# Patient Record
Sex: Male | Born: 1945 | Race: White | Hispanic: No | Marital: Married | Smoking: Former smoker
Health system: Southern US, Community
[De-identification: ages and names within clinical notes are randomized; demographics above are authoritative.]

## PROBLEM LIST (undated history)

## (undated) DIAGNOSIS — Z87442 Personal history of urinary calculi: Secondary | ICD-10-CM

## (undated) DIAGNOSIS — R1310 Dysphagia, unspecified: Secondary | ICD-10-CM

## (undated) DIAGNOSIS — I10 Essential (primary) hypertension: Secondary | ICD-10-CM

## (undated) DIAGNOSIS — C3490 Malignant neoplasm of unspecified part of unspecified bronchus or lung: Secondary | ICD-10-CM

## (undated) DIAGNOSIS — C61 Malignant neoplasm of prostate: Secondary | ICD-10-CM

## (undated) DIAGNOSIS — R0600 Dyspnea, unspecified: Secondary | ICD-10-CM

## (undated) DIAGNOSIS — E785 Hyperlipidemia, unspecified: Secondary | ICD-10-CM

## (undated) HISTORY — DX: Essential (primary) hypertension: I10

## (undated) HISTORY — DX: Malignant neoplasm of prostate: C61

---

## 2006-04-01 ENCOUNTER — Encounter: Admission: RE | Admit: 2006-04-01 | Discharge: 2006-04-01 | Payer: Self-pay | Admitting: Family Medicine

## 2006-04-01 ENCOUNTER — Ambulatory Visit: Payer: Self-pay | Admitting: Dentistry

## 2006-04-07 ENCOUNTER — Ambulatory Visit: Admission: RE | Admit: 2006-04-07 | Discharge: 2006-06-22 | Payer: Self-pay | Admitting: *Deleted

## 2006-04-08 ENCOUNTER — Ambulatory Visit (HOSPITAL_COMMUNITY): Admission: RE | Admit: 2006-04-08 | Discharge: 2006-04-08 | Payer: Self-pay | Admitting: Internal Medicine

## 2006-04-08 IMAGING — CT NM PET TUM IMG SKULL BASE T - THIGH
6 series · 25 of 25 positions shown · IV contrast (OM)
Comparison: None.   The outside report from a neck CT from [facility] is reviewed.

CLINICAL DATA: Tonsillar cancer.  Right-sided bleeding.  
FDG PET-CT TUMOR IMAGING (SKULL BASE TO THIGHS):
Fasting Blood Glucose:  99.
TECHNIQUE: 18.4 mCi F-18 FDG were administered via right AC injection.  Full ring PET imaging was performed from the skull base through the mid-thighs 60 minutes after injection.  CT data was obtained and used for attenuation correction and anatomic localization only.  (This was not acquired as a diagnostic CT examination.)  Subsequently, dedicated images were obtained with a small field of view through the neck.

[Series 1: pet ac · axial · 3.3mm · 4.69mm/px · z∈[-908,-38]mm · 5 of 267 slices shown]
[im 1/267]
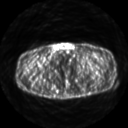
[im 67/267]
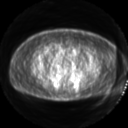
[im 134/267]
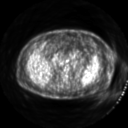
[im 200/267]
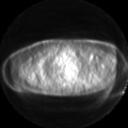
[im 267/267]
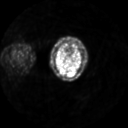

[Series 2: ct images · axial · 3.8mm · 0.98mm/px · z∈[-908,-38]mm · 5 of 267 slices shown]
[im 1/267]
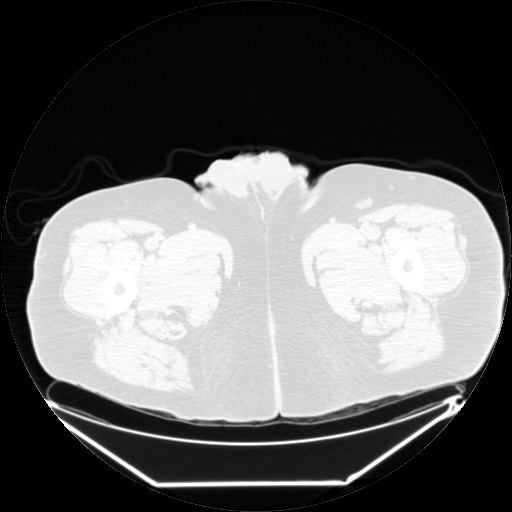
[im 67/267]
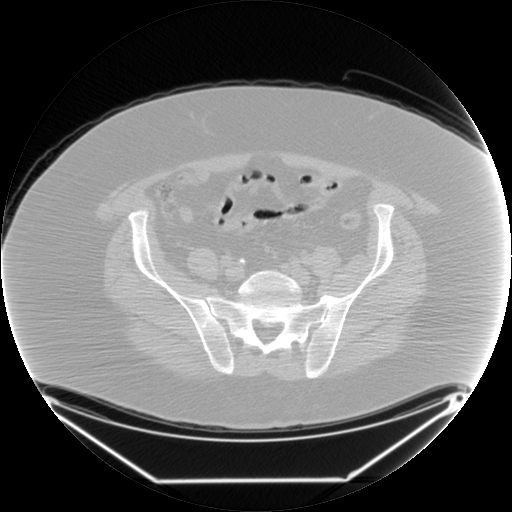
[im 134/267]
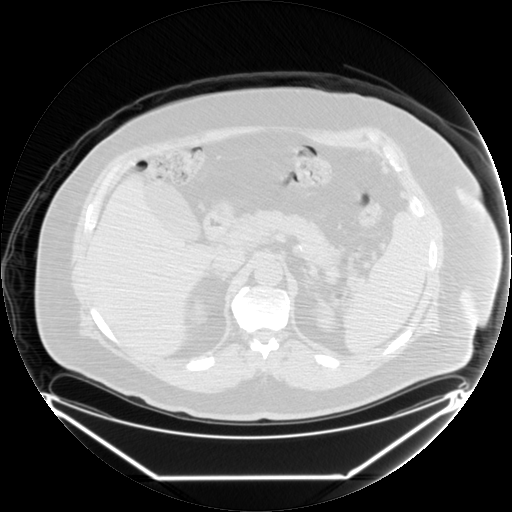
[im 200/267]
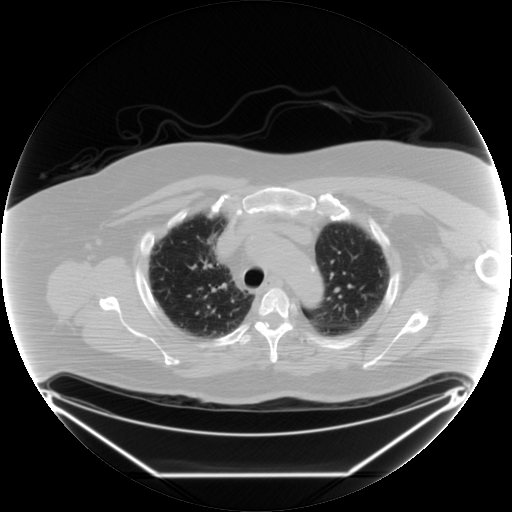
[im 267/267  brain]
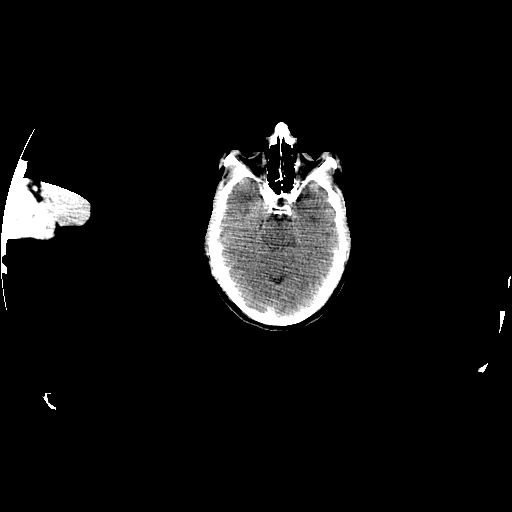

[Series 2: pet nac · axial · 3.3mm · 4.69mm/px · z∈[-908,-38]mm · 6 of 267 slices shown]
[im 1/267]
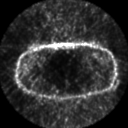
[im 54/267]
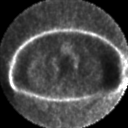
[im 107/267]
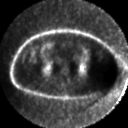
[im 160/267]
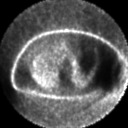
[im 213/267]
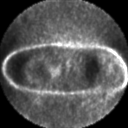
[im 267/267]
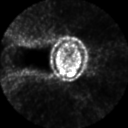

[Series 123: mip · coronal · 3.3mm · 4.69mm/px · 1 of 30 slices shown]
[im 1/30]
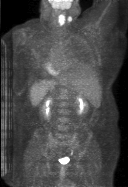

[Series 151: reformatted · axial · 3.3mm · 3.91mm/px · z∈[-895,-38]mm · 6 of 261 slices shown (1 of 2)]
[im 1/261]
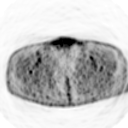
[im 53/261]
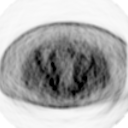
[im 105/261]
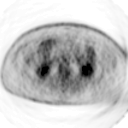
[im 157/261]
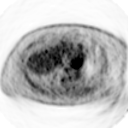
[im 209/261]
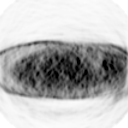
[im 261/261]
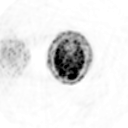

[Series 153: reformatted · coronal · 4.7mm · 6.98mm/px · 2 of 72 slices shown (2 of 2)]
[im 1/72]
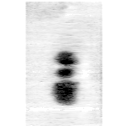
[im 72/72]
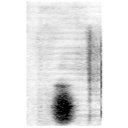

[25 of 25 positions shown; findings below may reference images not displayed]

FINDINGS: Dedicated neck images demonstrate malignant range activity centered within the right palatine tonsil region.  This begins just cephalad to the tongue base and continues to the caudal level of the right-sided vallecula.  The hypermetabolism corresponds to an approximately 3.5 x 3.4 cm mass which is most apparent on image 57.  This lesion measures maximum SUV of 26.0 grams per milliliter.  
There is a metastatic node in the jugular chain level II on the right measuring 2.1 x 1.8 cm and a maximum of 14.3 grams per milliliter.  
There are smaller lower right jugular and right submandibular lymph nodes.  The submandibular nodes are more mild but remain suspicious measuring maximally 4.0 grams per milliliter.  A more laterally positioned node deep to the sternocleidomastoid at the level of the hyoid on image 67 measures maximally 3.5 grams per milliliter.  There are also prominent jugular chain nodes just above the cricoid cartilage with mild activity (image 71).  
On the left side of the neck, there are smaller nodes with very mild activity.  An example is seen in the jugular chain on image 64 measuring maximally 2.7 grams per milliliter.  
Note is made of moderate activity measuring maximally 7.8 grams per milliliter involving right-sided mandibular teeth.  This correlates with periapical lucency on image 49.  
Whole body images demonstrate apparent left-sided nasopharyngeal activity which is felt to be physiologic and is not confirmed on the dedicated neck images.  
The exam is limited for evaluation of the chest, abdomen, and pelvis due to patient?s body habitus.  There are small axillary lymph nodes which measure between 2.5 and 2.6 grams per milliliter.  No other abnormal activity within the chest.  No abnormal activity within the abdomen or pelvis. 
CT images performed for attenuation correction demonstrate above described findings within the neck.  A 2 to 3 mm nodule on the right major fissure on image 78.  Mild fibrotic changes involve primarily the upper lobes subpleurally greater on the left than right.
IMPRESSION: 1.  Large hypermetabolic right-sided mass centered in the palatine tonsil region.  Definite right level II jugular node metastasis with other smaller less hypermetabolic nodes throughout the right neck which are suspicious.  Left-sided cervical and axillary lymph nodes are most likely reactive.  
2.  Dental activity in the right mandibular region.  Correlate with infection in this area. 
3.  Lung nodule is indeterminate and could be re-evaluated on followup diagnostic chest CT. 
4.  Mild left greater than right upper lobe peripheral fibrosis.  Correlate with prior infection or radiation therapy.  
5.  Somewhat limited exam due to patient body habitus.

## 2006-04-08 IMAGING — PT NM PET TUM IMG SKULL BASE T - THIGH
1 of 4 series · 2 of 25 positions shown · non-contrast
Comparison: None.   The outside report from a neck CT from [facility] is reviewed.

CLINICAL DATA: Tonsillar cancer.  Right-sided bleeding.  
FDG PET-CT TUMOR IMAGING (SKULL BASE TO THIGHS):
Fasting Blood Glucose:  99.
TECHNIQUE: 18.4 mCi F-18 FDG were administered via right AC injection.  Full ring PET imaging was performed from the skull base through the mid-thighs 60 minutes after injection.  CT data was obtained and used for attenuation correction and anatomic localization only.  (This was not acquired as a diagnostic CT examination.)  Subsequently, dedicated images were obtained with a small field of view through the neck.

[Series 2: ct images · axial · 3.8mm · 0.98mm/px · z∈[-119,-76]mm · 2 of 91 slices shown]
[im 65/91  brain]
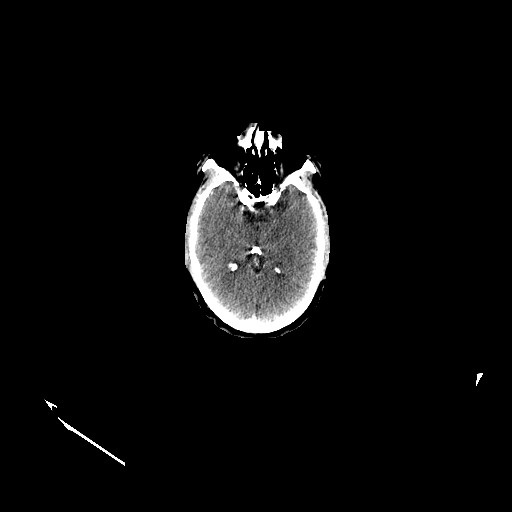
[im 78/91  brain]
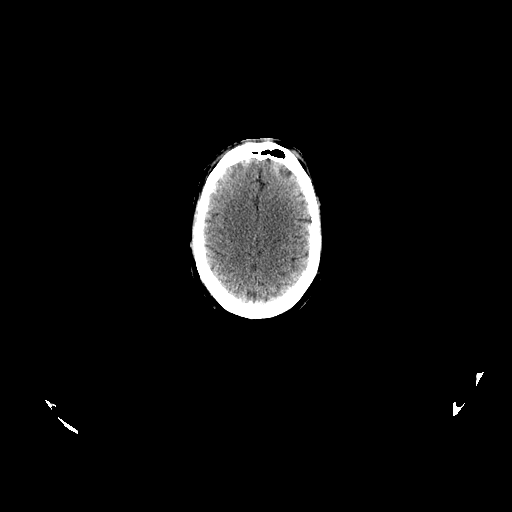

[2 of 25 positions shown; findings below may reference images not displayed]

FINDINGS: Dedicated neck images demonstrate malignant range activity centered within the right palatine tonsil region.  This begins just cephalad to the tongue base and continues to the caudal level of the right-sided vallecula.  The hypermetabolism corresponds to an approximately 3.5 x 3.4 cm mass which is most apparent on image 57.  This lesion measures maximum SUV of 26.0 grams per milliliter.  
There is a metastatic node in the jugular chain level II on the right measuring 2.1 x 1.8 cm and a maximum of 14.3 grams per milliliter.  
There are smaller lower right jugular and right submandibular lymph nodes.  The submandibular nodes are more mild but remain suspicious measuring maximally 4.0 grams per milliliter.  A more laterally positioned node deep to the sternocleidomastoid at the level of the hyoid on image 67 measures maximally 3.5 grams per milliliter.  There are also prominent jugular chain nodes just above the cricoid cartilage with mild activity (image 71).  
On the left side of the neck, there are smaller nodes with very mild activity.  An example is seen in the jugular chain on image 64 measuring maximally 2.7 grams per milliliter.  
Note is made of moderate activity measuring maximally 7.8 grams per milliliter involving right-sided mandibular teeth.  This correlates with periapical lucency on image 49.  
Whole body images demonstrate apparent left-sided nasopharyngeal activity which is felt to be physiologic and is not confirmed on the dedicated neck images.  
The exam is limited for evaluation of the chest, abdomen, and pelvis due to patient?s body habitus.  There are small axillary lymph nodes which measure between 2.5 and 2.6 grams per milliliter.  No other abnormal activity within the chest.  No abnormal activity within the abdomen or pelvis. 
CT images performed for attenuation correction demonstrate above described findings within the neck.  A 2 to 3 mm nodule on the right major fissure on image 78.  Mild fibrotic changes involve primarily the upper lobes subpleurally greater on the left than right.
IMPRESSION: 1.  Large hypermetabolic right-sided mass centered in the palatine tonsil region.  Definite right level II jugular node metastasis with other smaller less hypermetabolic nodes throughout the right neck which are suspicious.  Left-sided cervical and axillary lymph nodes are most likely reactive.  
2.  Dental activity in the right mandibular region.  Correlate with infection in this area. 
3.  Lung nodule is indeterminate and could be re-evaluated on followup diagnostic chest CT. 
4.  Mild left greater than right upper lobe peripheral fibrosis.  Correlate with prior infection or radiation therapy.  
5.  Somewhat limited exam due to patient body habitus.

## 2006-04-13 ENCOUNTER — Ambulatory Visit: Payer: Self-pay | Admitting: Dentistry

## 2006-05-05 DIAGNOSIS — C140 Malignant neoplasm of pharynx, unspecified: Secondary | ICD-10-CM

## 2006-05-05 HISTORY — DX: Malignant neoplasm of pharynx, unspecified: C14.0

## 2006-05-05 HISTORY — PX: OTHER SURGICAL HISTORY: SHX169

## 2006-07-16 ENCOUNTER — Ambulatory Visit: Payer: Self-pay | Admitting: Dentistry

## 2006-08-10 ENCOUNTER — Ambulatory Visit (HOSPITAL_COMMUNITY): Admission: RE | Admit: 2006-08-10 | Discharge: 2006-08-10 | Payer: Self-pay | Admitting: *Deleted

## 2006-08-10 IMAGING — PT NM PET TUM IMG SKULL BASE T - THIGH
4 series · 25 of 25 positions shown · non-contrast
Comparison: PET CT of [DATE].

CLINICAL DATA: Right-sided tonsillar cancer. Radiation therapy and chemotherapy last treatment in [DATE].
FDG PET-CT TUMOR IMAGING (SKULL BASE TO THIGHS) ? [DATE]: 
Fasting Blood Glucose:  107.
TECHNIQUE: 14.7 mCi F18-FDG were administered via a right AC injection.  Full ring PET imaging was performed from the skull base through the mid-thighs 65 minutes after injection.  Extra views were performed of the head and neck region.  CT data was obtained and used for attenuation correction and anatomic localization only.  (This was not acquired as a diagnostic CT examination.)

[Series 1: pet ac · axial · 3.3mm · 4.69mm/px · z∈[-870,+0]mm · 8 of 267 slices shown]
[im 1/267]
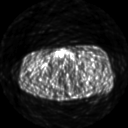
[im 39/267]
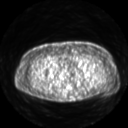
[im 77/267]
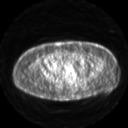
[im 115/267]
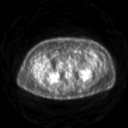
[im 153/267]
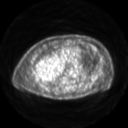
[im 191/267]
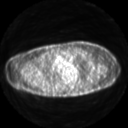
[im 229/267]
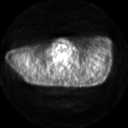
[im 267/267]
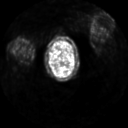

[Series 2: ct images · axial · 3.8mm · 0.98mm/px · z∈[-870,+0]mm · 8 of 266 slices shown]
[im 1/266]
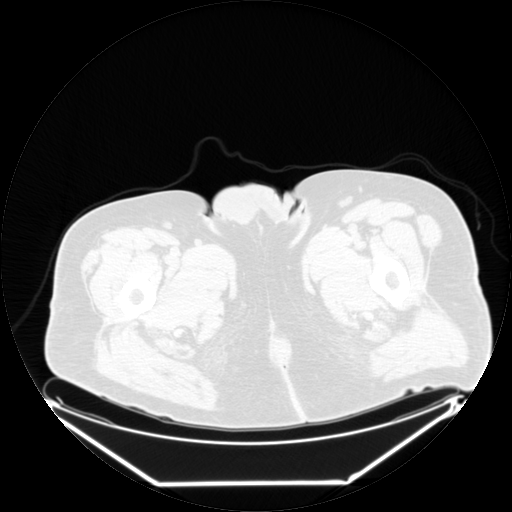
[im 38/266]
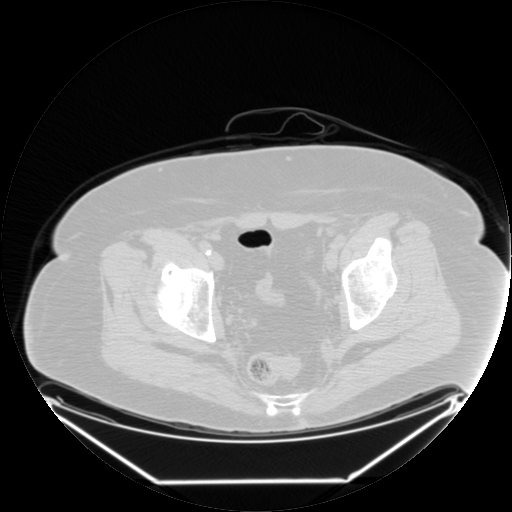
[im 76/266]
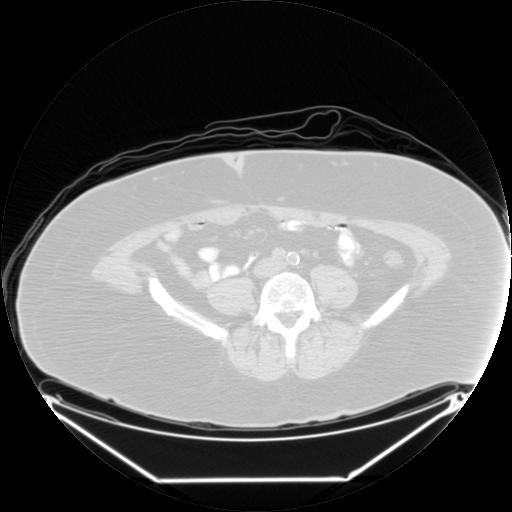
[im 114/266]
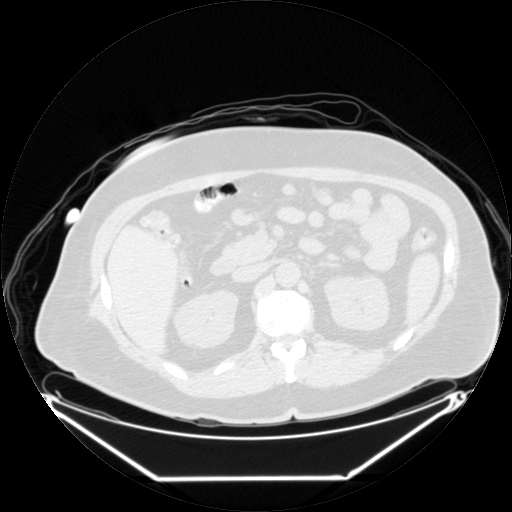
[im 152/266]
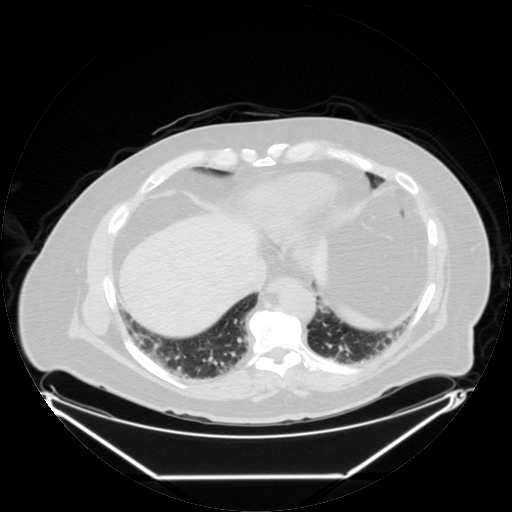
[im 190/266]
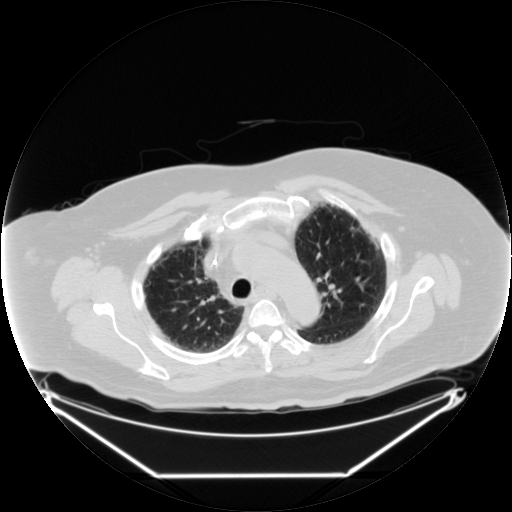
[im 228/266  brain]
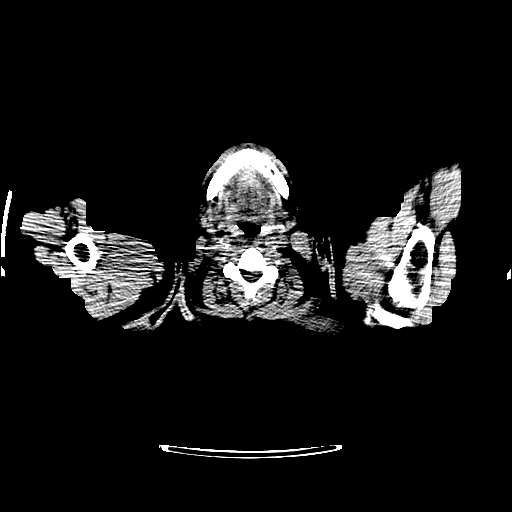
[im 266/266  brain]
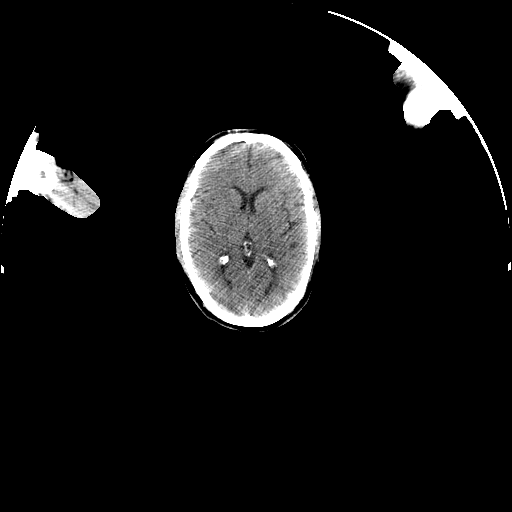

[Series 2: pet nac · axial · 3.3mm · 4.69mm/px · z∈[-870,+0]mm · 8 of 267 slices shown]
[im 1/267]
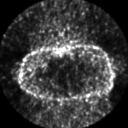
[im 39/267]
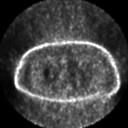
[im 77/267]
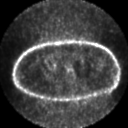
[im 115/267]
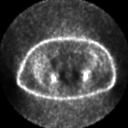
[im 153/267]
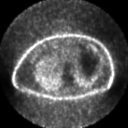
[im 191/267]
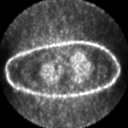
[im 229/267]
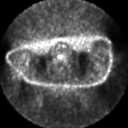
[im 267/267]
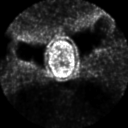

[Series 123: mip · coronal · 3.3mm · 4.69mm/px · 1 of 30 slices shown]
[im 1/30]
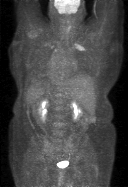

[25 of 25 positions shown; findings below may reference images not displayed]

FINDINGS: Dedicated head/neck images demonstrate improved persistent malignant-range activity beginning at the level of the right tongue base and extending inferiorly to the level of the right vallecula.  There may be slight increase in caudal extension of activity but this could be partially treatment-related.  Superiorly, there is less hypermetabolism in the region of the palatine tonsil.  Overall, today?s metabolism has decreased, measuring 7.9 g/ml compared to approximately 26 g/ml on the prior examination.  
There is persistent hypermetabolism within a right-sided Level II node currently measuring 3.8 g/ml compared with 14.3 g/ml on the prior exam.  There is decreased size of this lymph node as well.  
A tiny node in the right jugular chain deep to the sternocleidomastoid at the level of the hyoid measures approximately 2.3 g/ml on image #49 of the neck sequence and is decreased from 3.5 g/ml on the prior exam.  The right submandibular node is also decreased in hypermetabolism.
Scattered mildly metabolic left-sided nodes are again felt to be reactive.  
Whole body images are again limited by the patient?s body habitus.  There are new areas of hypermetabolism involving the right greater than left lung apices. These correspond to areas of ground-glass attenuation and interlobular septal thickening (images #69 and #73).  These are felt most likely to be related to radiation change.  The most prominent activity is in the right apical region at 7.1 g/ml.  
No other abnormal activity in the chest.  There is mild activity at the site of gastrostomy.  No abnormal activity within the pelvis.  
CT images performed for attenuation correction demonstrate decreased conspicuity of the right-sided mass described previously.  The previously described nodule along the right major fissure is similar on image #89.  There is dense coronary artery atherosclerosis including throughout the LAD.  There is a punctate right renal calculus, nonobstructive.  Pars defects at L5.
IMPRESSION: 1.  Overall response to therapy with decreased activity and CT conspicuity of a right-sided mucosal mass now centered at the right side of the tongue base.  Decreased hypermetabolism within right-sided jugular and submandibular nodes remainsuspicious for metastatic disease.
2.  New hypermetabolism within the lung apices felt to be due to active radiation pneumonitis.  Recommend attention on follow-up.
3.  There may be minimal increase in caudal extension of the mucosal hypermetabolism. This could be correlated with endoscopy if indicated.
4.  The exam is again limited due to the patient?s size.

## 2006-08-10 IMAGING — PT NM PET TUM IMG SKULL BASE T - THIGH
1 of 5 series · 1 of 25 positions shown · non-contrast
Comparison: PET CT of [DATE].

CLINICAL DATA: Right-sided tonsillar cancer. Radiation therapy and chemotherapy last treatment in [DATE].
FDG PET-CT TUMOR IMAGING (SKULL BASE TO THIGHS) ? [DATE]: 
Fasting Blood Glucose:  107.
TECHNIQUE: 14.7 mCi F18-FDG were administered via a right AC injection.  Full ring PET imaging was performed from the skull base through the mid-thighs 65 minutes after injection.  Extra views were performed of the head and neck region.  CT data was obtained and used for attenuation correction and anatomic localization only.  (This was not acquired as a diagnostic CT examination.)

[Series 2: ct images · axial · 3.8mm · 0.98mm/px · 1 of 91 slices shown]
[im 91/91  brain]
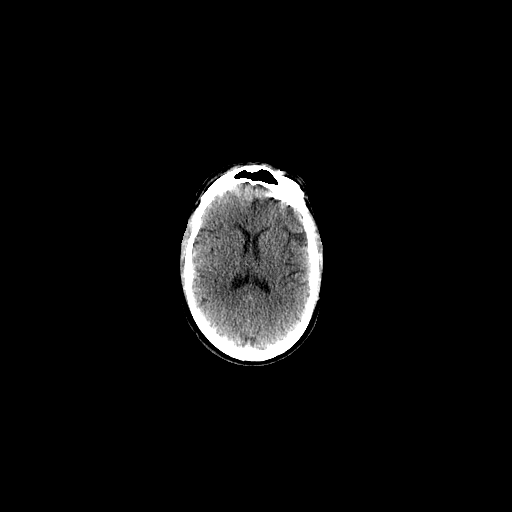

[1 of 25 positions shown; findings below may reference images not displayed]

FINDINGS: Dedicated head/neck images demonstrate improved persistent malignant-range activity beginning at the level of the right tongue base and extending inferiorly to the level of the right vallecula.  There may be slight increase in caudal extension of activity but this could be partially treatment-related.  Superiorly, there is less hypermetabolism in the region of the palatine tonsil.  Overall, today?s metabolism has decreased, measuring 7.9 g/ml compared to approximately 26 g/ml on the prior examination.  
There is persistent hypermetabolism within a right-sided Level II node currently measuring 3.8 g/ml compared with 14.3 g/ml on the prior exam.  There is decreased size of this lymph node as well.  
A tiny node in the right jugular chain deep to the sternocleidomastoid at the level of the hyoid measures approximately 2.3 g/ml on image #49 of the neck sequence and is decreased from 3.5 g/ml on the prior exam.  The right submandibular node is also decreased in hypermetabolism.
Scattered mildly metabolic left-sided nodes are again felt to be reactive.  
Whole body images are again limited by the patient?s body habitus.  There are new areas of hypermetabolism involving the right greater than left lung apices. These correspond to areas of ground-glass attenuation and interlobular septal thickening (images #69 and #73).  These are felt most likely to be related to radiation change.  The most prominent activity is in the right apical region at 7.1 g/ml.  
No other abnormal activity in the chest.  There is mild activity at the site of gastrostomy.  No abnormal activity within the pelvis.  
CT images performed for attenuation correction demonstrate decreased conspicuity of the right-sided mass described previously.  The previously described nodule along the right major fissure is similar on image #89.  There is dense coronary artery atherosclerosis including throughout the LAD.  There is a punctate right renal calculus, nonobstructive.  Pars defects at L5.
IMPRESSION: 1.  Overall response to therapy with decreased activity and CT conspicuity of a right-sided mucosal mass now centered at the right side of the tongue base.  Decreased hypermetabolism within right-sided jugular and submandibular nodes remainsuspicious for metastatic disease.
2.  New hypermetabolism within the lung apices felt to be due to active radiation pneumonitis.  Recommend attention on follow-up.
3.  There may be minimal increase in caudal extension of the mucosal hypermetabolism. This could be correlated with endoscopy if indicated.
4.  The exam is again limited due to the patient?s size.

## 2006-09-01 ENCOUNTER — Ambulatory Visit: Payer: Self-pay | Admitting: Dentistry

## 2007-01-19 ENCOUNTER — Ambulatory Visit: Payer: Self-pay | Admitting: Cardiology

## 2007-01-22 ENCOUNTER — Ambulatory Visit: Payer: Self-pay | Admitting: Cardiology

## 2007-05-06 HISTORY — PX: PEG PLACEMENT: SHX5437

## 2009-03-13 ENCOUNTER — Encounter (INDEPENDENT_AMBULATORY_CARE_PROVIDER_SITE_OTHER): Payer: Self-pay | Admitting: *Deleted

## 2009-03-27 ENCOUNTER — Encounter (INDEPENDENT_AMBULATORY_CARE_PROVIDER_SITE_OTHER): Payer: Self-pay | Admitting: *Deleted

## 2009-03-30 ENCOUNTER — Encounter: Payer: Self-pay | Admitting: Cardiology

## 2010-05-26 ENCOUNTER — Encounter (HOSPITAL_COMMUNITY): Payer: Self-pay | Admitting: Internal Medicine

## 2010-09-17 NOTE — Assessment & Plan Note (Signed)
[facility]                          [facility] OFFICE NOTE   Cody Carr, Cody Carr                     MRN:          [id number]  DATE:01/19/2007                            DOB:          07-Feb-1946    REFERRING PHYSICIANS:  Dr. Ubaldo Glassing and Dr. Linna Darner.   REASON FOR CONSULTATION:  Evaluation of a 65 year old male with evidence  of coronary calcifications by CT PET scan.   HISTORY OF PRESENT ILLNESS:  The patient is a 65 year old male with no  prior cardiovascular history.  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  The patient has received previous chemotherapy.  He has done, actually,  quite well, and his Port-a-Cath, as well as percutaneous endoscopic  gastrostomy feeding tube, have been removed.  According to the final CT  PET, the patient is free of recurrent disease.  The patient was brought  personally by Dr. Ubaldo Glassing to our office to evaluate a finding of  significant coronary calcification found on CT PET scan recently.   The patient states that he has no history of substernal chest pain, he  has no dyspnea on exertion or at rest.  He exercise tolerance is good.  The patient, as a matter of fact, loss 85 pounds since he has been under  treatment for his cancer.  He states that with his wife he walks  regularly 2 miles several times a week.  He states this is a hilly  course with lots of uphills and downhills.  He develops no chest pain,  and states that he has no shortness of breath.  He, approximately, has a  10-minute pace.  He also no orthopnea and PND, no palpitations or  syncope.  He reports a history of obstructive sleep apnea which has been  resolved after he has lost the weight.   The patient's EKG today in the office is noted for normal sinus rhythm  with no acute ischemic changes and no prior infarct patterns.   The patient states that he has been exposed in  the past to airplane  fuels, but reports no other exposure history.  He also used to smoke  half a pack a day but quit in 1982.  He occasionally has an alcoholic  beverage.   ALLERGIES:  No known drug allergies.   MEDICATIONS:  1. __________ .  2. Aspirin.  3. Multivitamin.   FAMILY HISTORY:  Noncontributory.   SOCIAL HISTORY:  The patient is the husband of Cody Carr who works with Dr.  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  As  outlined above, the patient quit smoking in 1982.  He drinks occasional  caffeine and occasional alcoholic beverage.   The patient reports that he has not had a lipid panel done since 1993.   REVIEW OF SYSTEMS:  As per HPI.  No nausea, vomiting, no fevers or  chills, no melena or hematochezia, no dysuria or frequency, no  palpitations or syncope.  No neurological symptoms.  The remainder of  the review of systems are negative.  PHYSICAL EXAMINATION:  VITAL SIGNS:  Blood pressure 134/86, heart rate  65 beats per minute, weight is 204 pounds.  HEENT:  Normal.  NECK EXAM:  Normal carotid upstrokes, no carotid bruits.  LUNGS:  Clear breath sounds bilaterally.  HEART:  Regular rate and rhythm, normal S1, S2, no murmurs, rubs, or  gallops.  ABDOMEN:  Soft, nontender, no rebound or guarding.  Good bowel sounds.  EXTREMITY EXAM:  No cyanosis, clubbing, or edema.  NEURO:  The patient is alert, oriented and grossly nonfocal.   PROBLEM LIST:  1. 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  2. Remote history of tobacco use.  3. Status post tonsillar cancer treated with chemotherapy.  4. Rule out dyslipidemia.   PLAN:  1. The patient is rather asymptomatic.  He does have coronary      calcification by CT PET scan, and although it has increased his      risk for occlusive coronary disease, it is by no means certain that      the patient has multivessel high-grade stenotic lesions.  2. I have given the patient the opportunity to proceed here  with a      catheterization or a stress treadmill test.  I told him that we do      have false negatives with stress test, but it is unlikely that we      will miss main coronary artery disease and multivessel coronary      artery disease which could lead to a recommendation of bypass      surgery.  Therefore, I agreed with the patient that as an initial      test, stress testing with Cardiolite imaging is very appropriate.  3. We will also obtain a lipid panel.  If elevated, the patient can be      started on secondary risk prevention with a statin drug.  4. The patient will follow up with me in the next couple of weeks and      we will review the studies.     Learta Codding, MD,FACC  Electronically Signed    GED/MedQ  DD: 01/19/2007  DT: 01/19/2007  Job #: [id number]   cc:   Lebron Conners. Darovsky, M.D.  Erasmo Downer, MD

## 2011-06-23 DIAGNOSIS — C01 Malignant neoplasm of base of tongue: Secondary | ICD-10-CM | POA: Insufficient documentation

## 2018-10-29 ENCOUNTER — Ambulatory Visit (INDEPENDENT_AMBULATORY_CARE_PROVIDER_SITE_OTHER): Payer: Medicare Other | Admitting: Urology

## 2018-10-29 DIAGNOSIS — N401 Enlarged prostate with lower urinary tract symptoms: Secondary | ICD-10-CM | POA: Diagnosis not present

## 2018-10-29 DIAGNOSIS — N5201 Erectile dysfunction due to arterial insufficiency: Secondary | ICD-10-CM

## 2018-10-29 DIAGNOSIS — R972 Elevated prostate specific antigen [PSA]: Secondary | ICD-10-CM | POA: Diagnosis not present

## 2018-10-29 DIAGNOSIS — R3912 Poor urinary stream: Secondary | ICD-10-CM

## 2018-11-01 ENCOUNTER — Other Ambulatory Visit (HOSPITAL_COMMUNITY): Payer: Self-pay | Admitting: Urology

## 2018-11-01 DIAGNOSIS — R972 Elevated prostate specific antigen [PSA]: Secondary | ICD-10-CM

## 2018-12-03 ENCOUNTER — Other Ambulatory Visit: Payer: Self-pay

## 2018-12-03 ENCOUNTER — Ambulatory Visit (HOSPITAL_COMMUNITY)
Admission: RE | Admit: 2018-12-03 | Discharge: 2018-12-03 | Disposition: A | Discharge status: Home / Self Care | Payer: Medicare Other | Source: Ambulatory Visit | Attending: Urology | Admitting: Urology

## 2018-12-03 DIAGNOSIS — C61 Malignant neoplasm of prostate: Secondary | ICD-10-CM | POA: Insufficient documentation

## 2018-12-03 DIAGNOSIS — R972 Elevated prostate specific antigen [PSA]: Secondary | ICD-10-CM | POA: Insufficient documentation

## 2018-12-03 MED ORDER — LIDOCAINE HCL (PF) 2 % IJ SOLN
INTRAMUSCULAR | Status: AC
  Administered 2018-12-03: 12:00:00 10 mL
  Filled 2018-12-03: qty 10

## 2018-12-03 MED ORDER — LIDOCAINE HCL (PF) 1 % IJ SOLN
INTRAMUSCULAR | Status: AC
  Administered 2018-12-03: 2.1 mL
  Filled 2018-12-03: qty 5

## 2018-12-03 MED ORDER — CEFTRIAXONE SODIUM 1 G IJ SOLR
INTRAMUSCULAR | Status: AC
  Administered 2018-12-03: 1 g via INTRAMUSCULAR
  Filled 2018-12-03: qty 10

## 2018-12-03 MED ORDER — CEFTRIAXONE SODIUM 1 G IJ SOLR
1.0000 g | Freq: Once | INTRAMUSCULAR | Status: AC
  Administered 2018-12-03: 1 g via INTRAMUSCULAR

## 2019-03-25 ENCOUNTER — Ambulatory Visit (INDEPENDENT_AMBULATORY_CARE_PROVIDER_SITE_OTHER): Payer: Medicare Other | Admitting: Urology

## 2019-03-25 DIAGNOSIS — R972 Elevated prostate specific antigen [PSA]: Secondary | ICD-10-CM | POA: Diagnosis not present

## 2019-05-04 ENCOUNTER — Other Ambulatory Visit: Payer: Self-pay

## 2019-05-04 DIAGNOSIS — C61 Malignant neoplasm of prostate: Secondary | ICD-10-CM

## 2019-05-06 HISTORY — PX: EYE SURGERY: SHX253

## 2019-06-17 ENCOUNTER — Other Ambulatory Visit: Payer: Self-pay | Admitting: Urology

## 2019-06-18 LAB — PSA: PSA: 4.4 ng/mL — ABNORMAL HIGH (ref <=4.0)

## 2019-06-23 NOTE — Progress Notes (Deleted)
Subjective:  1. Prostate cancer ([place])      I have prostate cancer. HPI: Cody Carr is a 74 year-old male established patient who is here evaluation for treatment of prostate cancer.  His prostate cancer was diagnosed 12/03/2018. He does have the pathology report from his biopsy. His cancer was diagnosed by Dr. Jeffie Pollock. His PSA at his time of diagnosis was 6.0. His most recent PSA is 6.   He does have problems with erectile dysfunction.   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 His PSA prior to this visit is down to 4.7.  He reports that he did have PSA's a few years ago that were in the 4-4.5 range. He is voiding well without new complaints and he reports his stream is actually improved. He has no bone pain or weight loss.   Prostate Volume 12ml.   Stage: T1c Nx Mx   Grade: Gleason 6(3+3) in the left base cores and the left mid lateral core with 80% involvement on of the left base lateral core and 10% and 20% in the other cores.   IPSS: 11.   SHIM: 4.   MSKCC: 70% OCD, 25% ECE, 1% LNI and 2% SVI.      ROS:  ROS:  A complete review of systems was performed.  All systems are negative except for pertinent findings as noted.   ROS  No Known Allergies  No outpatient encounter medications on file as of 06/24/2019.   No facility-administered encounter medications on file as of 06/24/2019.    No past medical history on file.  *** The histories are not reviewed yet. Please review them in the "History" navigator section and refresh this [place].  Social History   Socioeconomic History  . Marital status: Married    Spouse name: Not on file  . Number of children: Not on file  . Years of education: Not on file  . Highest education level: Not on file  Occupational History  . Not on file  Tobacco Use  . Smoking status: Not on file  Substance and Sexual  Activity  . Alcohol use: Not on file  . Drug use: Not on file  . Sexual activity: Not on file  Other Topics Concern  . Not on file  Social History Narrative  . Not on file   Social Determinants of Health   Financial Resource Strain:   . Difficulty of Paying Living Expenses: Not on file  Food Insecurity:   . Worried About Charity fundraiser in the Last Year: Not on file  . Ran Out of Food in the Last Year: Not on file  Transportation Needs:   . Lack of Transportation (Medical): Not on file  . Lack of Transportation (Non-Medical): Not on file  Physical Activity:   . Days of Exercise per Week: Not on file  . Minutes of Exercise per Session: Not on file  Stress:   . Feeling of Stress : Not on file  Social Connections:   . Frequency of Communication with Friends and Family: Not on file  . Frequency of Social Gatherings with Friends and Family: Not on file  . Attends Religious Services: Not on file  . Active Member of Clubs or Organizations: Not on file  . Attends Archivist Meetings: Not on file  . Marital Status: Not on file  Intimate Partner  Violence:   . Fear of Current or Ex-Partner: Not on file  . Emotionally Abused: Not on file  . Physically Abused: Not on file  . Sexually Abused: Not on file    No family history on file.     Objective: There were no vitals filed for this visit.   Physical Exam  Lab Results:  No results found for this or any previous visit (from the past 24 hour(s)).  BMET No results for input(s): NA, K, CL, CO2, GLUCOSE, BUN, CREATININE, CALCIUM in the last 72 hours. PSA PSA  Date Value Ref Range Status  06/17/2019 4.4 (H) < OR = 4.0 ng/mL Final    Comment:    The total PSA value from this assay system is  standardized against the WHO standard. The test  result will be approximately 20% lower when compared  to the equimolar-standardized total PSA (Beckman  Coulter). Comparison of serial PSA results should be  interpreted  with this fact in mind. . This test was performed using the Siemens  chemiluminescent method. Values obtained from  different assay methods cannot be used interchangeably. PSA levels, regardless of value, should not be interpreted as absolute evidence of the presence or absence of disease.    No results found for: TESTOSTERONE    Studies/Results: No results found.    Assessment & Plan: No problem-specific Assessment & Plan notes found for this encounter.    No orders of the defined types were placed in this encounter.    No orders of the defined types were placed in this encounter.     No follow-ups on file.   CC: Cody Blitz, MD      Irine Seal 06/23/2019

## 2019-06-24 ENCOUNTER — Ambulatory Visit: Payer: Medicare Other | Admitting: Urology

## 2019-07-11 ENCOUNTER — Telehealth: Payer: Self-pay | Admitting: Urology

## 2019-07-11 NOTE — Telephone Encounter (Signed)
His PSA is down further from 4.7 to 4.4.  He should keep his f/u in April.

## 2019-07-11 NOTE — Telephone Encounter (Signed)
Patient called for PSA results

## 2019-07-11 NOTE — Telephone Encounter (Signed)
Please see below.

## 2019-07-12 NOTE — Telephone Encounter (Signed)
Left message to return call to office.

## 2019-07-13 NOTE — Telephone Encounter (Signed)
Pt notified of results and will keep April appt.

## 2019-08-26 ENCOUNTER — Encounter: Payer: Self-pay | Admitting: Urology

## 2019-08-26 ENCOUNTER — Ambulatory Visit: Payer: Medicare PPO | Admitting: Urology

## 2019-08-26 ENCOUNTER — Other Ambulatory Visit: Payer: Self-pay

## 2019-08-26 VITALS — BP 181/102 | HR 79 | Temp 98.2°F | Ht 73.0 in | Wt 214.0 lb

## 2019-08-26 DIAGNOSIS — N5201 Erectile dysfunction due to arterial insufficiency: Secondary | ICD-10-CM | POA: Diagnosis not present

## 2019-08-26 DIAGNOSIS — R972 Elevated prostate specific antigen [PSA]: Secondary | ICD-10-CM

## 2019-08-26 DIAGNOSIS — C61 Malignant neoplasm of prostate: Secondary | ICD-10-CM

## 2019-08-26 NOTE — Progress Notes (Signed)
Subjective:  1. Prostate cancer ([place])   2. Elevated PSA   3. Erectile dysfunction due to arterial insufficiency      His prostate cancer was diagnosed 12/03/2018. He does have the pathology report from his biopsy. His cancer was diagnosed by Dr. Jeffie Pollock. His PSA at his time of diagnosis was 6.0. His most recent PSA is 4.4 in February.   He does have problems with erectile dysfunction but is not interested in therapy.   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 He reports that he did have PSA's a few years ago that were in the 4-4.5 range and the PSA is back to baseline. He is voiding well without new complaints and he reports his stream is actually improved. He has no bone pain or weight loss.    Prostate Volume 49ml.   Stage: T1c Nx Mx   Grade: Gleason 6(3+3) in the left base cores and the left mid lateral core with 80% involvement on of the left base lateral core and 10% and 20% in the other cores.   IPSS: 9.   SHIM: 4.   MSKCC: 70% OCD, 25% ECE, 1% LNI and 2% SVI.    IPSS    Row Name 08/26/19 0900         International Prostate Symptom Score   How often have you had the sensation of not emptying your bladder?  Not at All     How often have you had to urinate less than every two hours?  Not at All     How often have you found you stopped and started again several times when you urinated?  Less than half the time     How often have you found it difficult to postpone urination?  Not at All     How often have you had a weak urinary stream?  Less than half the time     How often have you had to strain to start urination?  Less than half the time     Total IPSS Score  6       Quality of Life due to urinary symptoms   If you were to spend the rest of your life with your urinary condition just the way it is now how would you feel about that?  Mostly Satisfied          ROS:  ROS:  A complete review of systems was performed.  All systems are negative except for pertinent findings as noted.   Review of Systems  All other systems reviewed and are negative.   No Known Allergies  Outpatient Encounter Medications as of 08/26/2019  Medication Sig  . aspirin 81 MG chewable tablet Chew by mouth daily.  . Biotin 1 MG CAPS Take by mouth.  . cholecalciferol (VITAMIN D3) 25 MCG (1000 UNIT) tablet Take 1,000 Units by mouth daily.  Marland Kitchen lisinopril (ZESTRIL) 2.5 MG tablet Take 2.5 mg by mouth daily.  Marland Kitchen lisinopril (ZESTRIL) 5 MG tablet   . Multiple Vitamin (MULTIVITAMIN) capsule Take 1 capsule by mouth daily.  . vitamin B-12 (CYANOCOBALAMIN) 100 MCG tablet Take 100 mcg by mouth daily.   No facility-administered encounter medications on file as of 08/26/2019.    Past Medical History:  Diagnosis Date  . Hypertension   . Prostate cancer ([place])   . 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 throat biopsy  2008    Social History   Socioeconomic History  . Marital status: Married    Spouse name: Not on file  . Number of children: Not on file  . Years of education: Not on file  . Highest education level: Not on file  Occupational History  . Not on file  Tobacco Use  . Smoking status: Former Smoker    Packs/day: 0.50    Years: 12.00    Pack years: 6.00    Types: Cigarettes    Quit date: 1980    Years since quitting: 41.3  . Smokeless tobacco: Never Used  Substance and Sexual Activity  . Alcohol use: Yes  . Drug use: Not on file  . Sexual activity: Not on file  Other Topics Concern  . Not on file  Social History Narrative  . Not on file   Social Determinants of Health   Financial Resource Strain:   . Difficulty of Paying Living Expenses:   Food Insecurity:   . Worried About Charity fundraiser in the Last Year:   . Arboriculturist in the Last Year:   Transportation  Needs:   . Film/video editor (Medical):   Marland Kitchen Lack of Transportation (Non-Medical):   Physical Activity:   . Days of Exercise per Week:   . Minutes of Exercise per Session:   Stress:   . Feeling of Stress :   Social Connections:   . Frequency of Communication with Friends and Family:   . Frequency of Social Gatherings with Friends and Family:   . Attends Religious Services:   . Active Member of Clubs or Organizations:   . Attends Archivist Meetings:   Marland Kitchen Marital Status:   Intimate Partner Violence:   . Fear of Current or Ex-Partner:   . Emotionally Abused:   Marland Kitchen Physically Abused:   . Sexually Abused:     No family history on file.     Objective: Vitals:   08/26/19 0934  BP: (!) 181/102  Pulse: 79  Temp: 98.2 F (36.8 C)   He reports that his BP is generally lower at home.   Physical Exam Vitals reviewed.  Constitutional:      Appearance: Normal appearance.  Genitourinary:    Comments: AP has multiple hemorrhoids with one that is inflammed. NST without mass. Prostate 2+ benign.  SV's non-palpable.  Neurological:     Mental Status: He is alert.     Lab Results:  No results found for this or any previous visit (from the past 24 hour(s)).  BMET No results for input(s): NA, K, CL, CO2, GLUCOSE, BUN, CREATININE, CALCIUM in the last 72 hours. PSA PSA  Date Value Ref Range Status  06/17/2019 4.4 (H) < OR = 4.0 ng/mL Final    Comment:    The total PSA value from this assay system is  standardized against the WHO standard. The test  result will be approximately 20% lower when compared  to the equimolar-standardized total PSA (Beckman  Coulter). Comparison of serial PSA results should be  interpreted with this fact in mind. . This test was performed using the Siemens  chemiluminescent method. Values obtained from  different assay methods cannot be used interchangeably. PSA levels, regardless of value, should not be interpreted as  absolute evidence of the presence or absence of disease.    No results found  for: TESTOSTERONE    Studies/Results: No results found.    Assessment & Plan: Prostate cancer.  He is doing well on surveillance with a decline in the PSA and a benign exam.  I will have him return in 3 months with a PSA and prostate MRI.  Subsequent biopsy decision will be based on those results.     ED.  He is not interested in therapy.       No orders of the defined types were placed in this encounter.    Orders Placed This Encounter  Procedures  . MR PROSTATE W WO CONTRAST    [facility].    Standing Status:   Future    Standing Expiration Date:   08/25/2020    Order Specific Question:   If indicated for the ordered procedure, I authorize the administration of contrast media per Radiology protocol    Answer:   Yes    Order Specific Question:   What is the patient's sedation requirement?    Answer:   No Sedation    Order Specific Question:   Does the patient have a pacemaker or implanted devices?    Answer:   No    Order Specific Question:   Radiology Contrast Protocol - do NOT remove file path    Answer:   [email or web address]    Order Specific Question:   Preferred imaging location?    Answer:   External  . PSA    Standing Status:   Future    Standing Expiration Date:   08/25/2020  . Basic metabolic panel    Standing Status:   Future    Standing Expiration Date:   08/25/2020      Return in about 3 months (around 11/25/2019) for With PSA and MRI results. .   CC: Monico Blitz, MD      Irine Seal 08/26/2019

## 2019-11-25 ENCOUNTER — Other Ambulatory Visit: Payer: Self-pay | Admitting: Urology

## 2019-11-25 LAB — BASIC METABOLIC PANEL
BUN: 20 mg/dL (ref 7–25)
CO2: 26 mmol/L (ref 20–32)
Calcium: 9.7 mg/dL (ref 8.6–10.3)
Chloride: 105 mmol/L (ref 98–110)
Creat: 1.03 mg/dL (ref 0.70–1.18)
Glucose, Bld: 92 mg/dL (ref 65–99)
Potassium: 4.7 mmol/L (ref 3.5–5.3)
Sodium: 138 mmol/L (ref 135–146)

## 2019-11-25 LAB — PSA: PSA: 5.9 ng/mL — ABNORMAL HIGH (ref <=4.0)

## 2019-12-01 ENCOUNTER — Other Ambulatory Visit: Payer: Self-pay

## 2019-12-01 DIAGNOSIS — R972 Elevated prostate specific antigen [PSA]: Secondary | ICD-10-CM

## 2019-12-05 ENCOUNTER — Other Ambulatory Visit: Payer: Self-pay | Admitting: Urology

## 2019-12-09 ENCOUNTER — Ambulatory Visit: Payer: Medicare PPO | Admitting: Urology

## 2019-12-09 ENCOUNTER — Other Ambulatory Visit: Payer: Self-pay

## 2019-12-09 ENCOUNTER — Ambulatory Visit
Admission: RE | Admit: 2019-12-09 | Discharge: 2019-12-09 | Disposition: A | Discharge status: Home / Self Care | Payer: Medicare PPO | Source: Ambulatory Visit | Attending: Urology | Admitting: Urology

## 2019-12-09 DIAGNOSIS — C61 Malignant neoplasm of prostate: Secondary | ICD-10-CM

## 2019-12-09 IMAGING — MR MR PROSTATE WO/W CM
12 series · 48 of 48 positions shown · IV contrast (20 ml multihance)
Comparison: None available

CLINICAL DATA: Prostate cancer surveillance. Biopsy [DATE].
Gleason 3+3=6 prostate adenocarcinoma in the LEFT base and LEFT
lateral mid gland. 74-year-old male

EXAM:
MR PROSTATE WITHOUT AND WITH CONTRAST
TECHNIQUE: Multiplanar multisequence MRI images were obtained of the pelvis
centered about the prostate. Pre and post contrast images were
obtained.
CONTRAST:  20mL MULTIHANCE GADOBENATE DIMEGLUMINE 529 MG/ML IV SOLN

[Series 4: T1 · axial · 5.0mm · 1.25mm/px · z∈[-15,+180]mm · 2 of 80 slices shown]
[im 1/80]
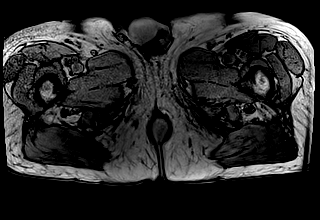
[im 80/80]
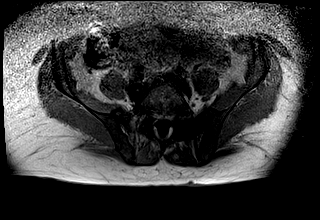

[Series 5: T2 · coronal · 3.0mm · 0.56mm/px · 1 of 23 slices shown (1 of 3)]
[im 1/23]
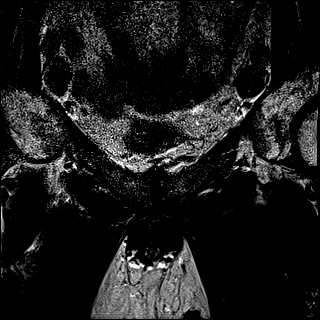

[Series 6: DWI · axial · 3.0mm · 1.75mm/px · z∈[+19,+76]mm · 2 of 60 slices shown (1 of 3)]
[im 1/60]
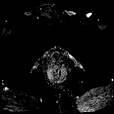
[im 60/60]
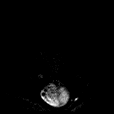

[Series 7: DWI · axial · 3.0mm · 1.75mm/px · 1 of 20 slices shown (2 of 3)]
[im 1/20]
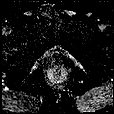

[Series 8: DWI · axial · 3.0mm · 1.75mm/px · 1 of 20 slices shown (3 of 3)]
[im 1/20]
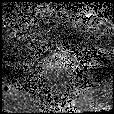

[Series 9: T2 · axial · 3.0mm · 0.56mm/px · 1 of 23 slices shown (2 of 3)]
[im 1/23]
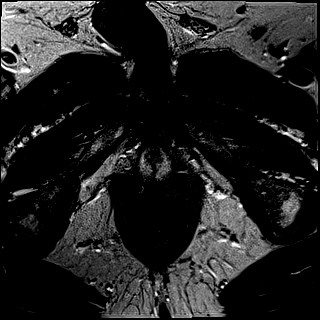

[Series 10: T2 · axial · 1.0mm · 1.04mm/px · z∈[+8,+87]mm · 2 of 80 slices shown (3 of 3)]
[im 1/80]
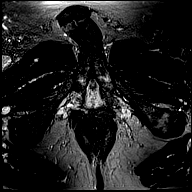
[im 80/80]
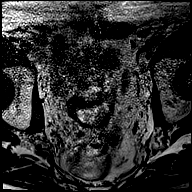

[Series 11: pre t1_twist_tra_dyn · axial · non-contrast · 3.5mm · 0.83mm/px · 1 of 18 slices shown]
[im 1/18]
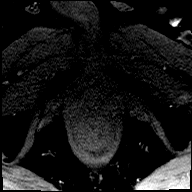

[Series 12: post t1_twist_tra_dyn-copy center · axial · non-contrast · 3.5mm · 0.83mm/px · z∈[+18,+77]mm · 17 of 540 slices shown]
[im 1/540]
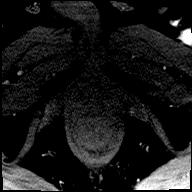
[im 34/540]
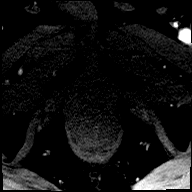
[im 68/540]
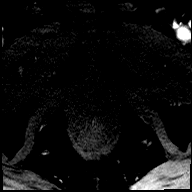
[im 102/540]
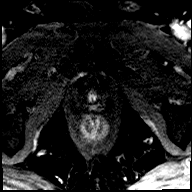
[im 135/540]
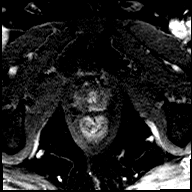
[im 169/540]
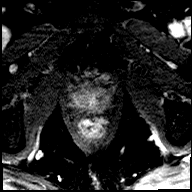
[im 203/540]
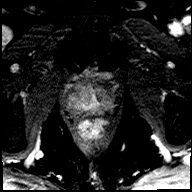
[im 236/540]
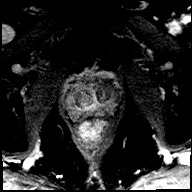
[im 270/540]
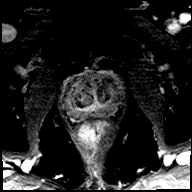
[im 304/540]
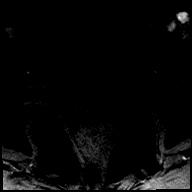
[im 337/540]
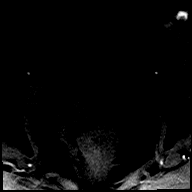
[im 371/540]
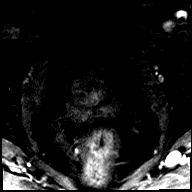
[im 405/540]
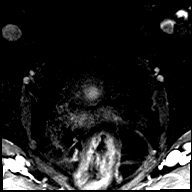
[im 438/540]
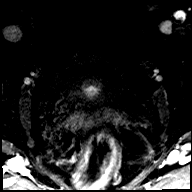
[im 472/540]
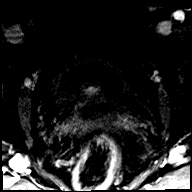
[im 506/540]
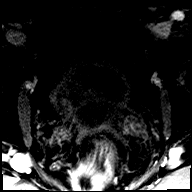
[im 540/540]
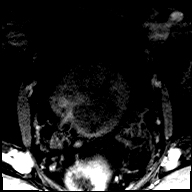

[Series 13: post t1_twist_tra_dyn-copy cent_sub · axial · 3.5mm · 0.83mm/px · z∈[+18,+77]mm · 16 of 521 slices shown]
[im 1/521]
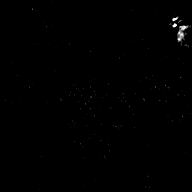
[im 35/521]
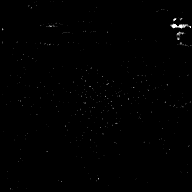
[im 70/521]
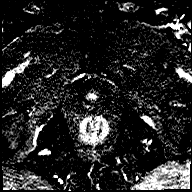
[im 105/521]
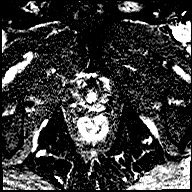
[im 139/521]
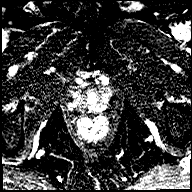
[im 174/521]
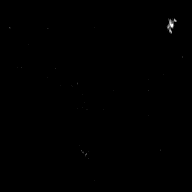
[im 209/521]
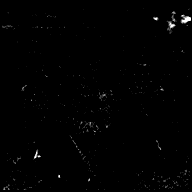
[im 243/521]
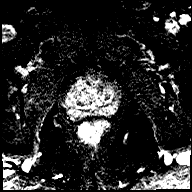
[im 278/521]
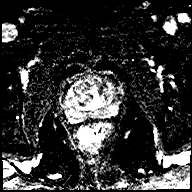
[im 313/521]
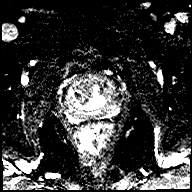
[im 347/521]
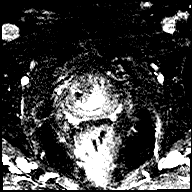
[im 382/521]
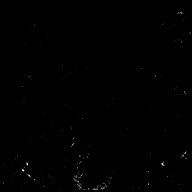
[im 417/521]
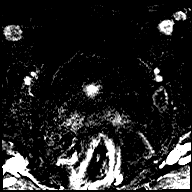
[im 451/521]
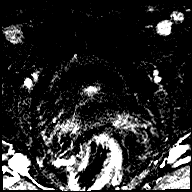
[im 486/521]
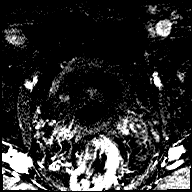
[im 521/521]
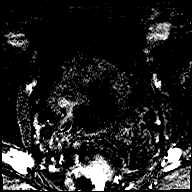

[Series 14: t1_vibe_dixon_tra_f · axial · 2.5mm · 0.91mm/px · z∈[-16,+181]mm · 2 of 80 slices shown]
[im 1/80]
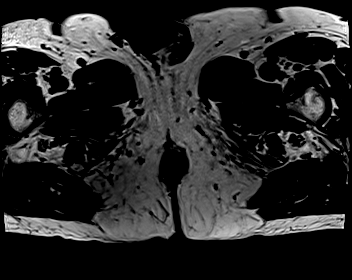
[im 80/80]
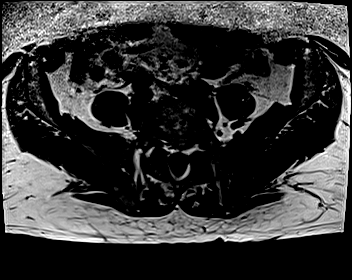

[Series 15: t1_vibe_dixon_tra_w · axial · 2.5mm · 0.91mm/px · z∈[-16,+181]mm · 2 of 80 slices shown]
[im 1/80]
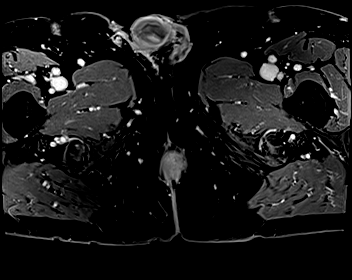
[im 80/80]
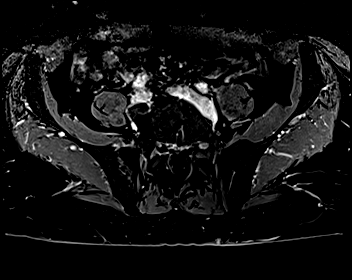

[48 of 48 positions shown; findings below may reference images not displayed]

FINDINGS: Prostate: Within the LEFT lateral mid gland there is a 8 mm x 5 mm
ovoid region of low signal intensity on T2 weighted imaging (image
11, series 9). Lesion also seen on coronal image 16/series 5. Mild
restricted diffusion at this level (image [DATE]).

The transitional zone is enlarged by capsulated nodules with no
suspicious imaging characteristics.

Volume: 4.1 x 4.3 x 3.3 cm (volume = 30 cm^3)

Transcapsular spread:  Absent

Seminal vesicle involvement: Absent

Neurovascular bundle involvement: Absent

Pelvic adenopathy: Absent

Bone metastasis: Absent

Other findings: And none
IMPRESSION: 1. Lesion in the LEFT lateral mid gland with mild restricted
diffusion. Lesion indeterminate but concerning for for high-grade
carcinoma ( PI-RADS: 4). (Dynacad 3D post processing performed). KOONTZ
#1.
2. Nodular transitional zone most consistent benign prostate
hypertrophy. PI-RADS: 2

## 2019-12-09 MED ORDER — GADOBENATE DIMEGLUMINE 529 MG/ML IV SOLN
20.0000 mL | Freq: Once | INTRAVENOUS | Status: AC | PRN
  Administered 2019-12-09: 20 mL via INTRAVENOUS

## 2019-12-13 ENCOUNTER — Telehealth: Payer: Self-pay

## 2019-12-13 NOTE — Telephone Encounter (Signed)
-----   Message from Irine Seal, MD sent at 12/12/2019  7:01 PM EDT ----- He has a PIRAD 4 lesion in the prostate which may need to be assessed with a targeted biopsy which we will discuss when he returns with his PSA on 8/20.

## 2019-12-13 NOTE — Telephone Encounter (Signed)
Wife made aware

## 2019-12-14 NOTE — Telephone Encounter (Signed)
He shouldn't need another.

## 2019-12-15 ENCOUNTER — Other Ambulatory Visit: Payer: Self-pay

## 2019-12-16 ENCOUNTER — Other Ambulatory Visit: Payer: Medicare PPO

## 2019-12-16 NOTE — Telephone Encounter (Signed)
Pt.notified

## 2019-12-22 NOTE — Progress Notes (Signed)
Subjective:  1. Prostate cancer ([place])   2. Elevated PSA   3. Microhematuria      His prostate cancer was diagnosed 12/03/2018. He does have the pathology report from his biopsy. His cancer was diagnosed by Dr. Jeffie Pollock. His PSA at his time of diagnosis was 6.0. His most recent PSA is 5.6 prior to this visit.   He does have problems with erectile dysfunction but is not interested in therapy.   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 His PSA on 8/6 was 5.9 which is up from about 4.5 at last check. Marland Kitchen  He is voiding well without new complaints and he reports his stream is actually improved. He has no bone pain or weight loss.  His UA today has 3-10 RBC's.  His IPSS is 6.   Prostate Volume 5ml.   Stage: T1c Nx Mx   Grade: Gleason 6(3+3) in the left base cores and the left mid lateral core with 80% involvement on of the left base lateral core and 10% and 20% in the other cores.   IPSS: 9.   SHIM: 4.   MSKCC: 70% OCD, 25% ECE, 1% LNI and 2% SVI.     IPSS    Row Name 12/23/19 1600         International Prostate Symptom Score   How often have you had the sensation of not emptying your bladder? Not at All     How often have you had to urinate less than every two hours? Not at All     How often have you found you stopped and started again several times when you urinated? Less than half the time     How often have you found it difficult to postpone urination? Not at All     How often have you had a weak urinary stream? Less than half the time     How often have you had to strain to start urination? Less than half the time     How many times did you typically get up at night to urinate? None     Total IPSS Score 6       Quality of Life due to urinary symptoms   If you were to spend the rest of your life with your urinary condition just the way it is now how would you feel  about that? Mixed             ROS:  ROS:  A complete review of systems was performed.  All systems are negative except for pertinent findings as noted.   ROS  No Known Allergies  Outpatient Encounter Medications as of 12/23/2019  Medication Sig  . aspirin 81 MG chewable tablet Chew by mouth daily.  . Biotin 1 MG CAPS Take by mouth.  . cholecalciferol (VITAMIN D3) 25 MCG (1000 UNIT) tablet Take 1,000 Units by mouth daily.  Marland Kitchen lisinopril (ZESTRIL) 2.5 MG tablet Take 2.5 mg by mouth daily.  . Multiple Vitamin (MULTIVITAMIN) capsule Take 1 capsule by mouth daily.  . vitamin B-12 (CYANOCOBALAMIN) 100 MCG tablet Take 100 mcg by mouth daily.  Marland Kitchen levofloxacin (LEVAQUIN) 750 MG tablet Take 1 tablet (750 mg total) by mouth daily. Take one hour prior to biopsy.  Marland Kitchen lisinopril (ZESTRIL) 5 MG tablet  (Patient not taking: Reported on 12/23/2019)   No facility-administered encounter medications on  file as of 12/23/2019.    Past Medical History:  Diagnosis Date  . Hypertension   . Prostate cancer ([place])   . 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 throat biopsy  2008    Social History   Socioeconomic History  . Marital status: Married    Spouse name: Not on file  . Number of children: Not on file  . Years of education: Not on file  . Highest education level: Not on file  Occupational History  . Not on file  Tobacco Use  . Smoking status: Former Smoker    Packs/day: 0.50    Years: 12.00    Pack years: 6.00    Types: Cigarettes    Quit date: 1980    Years since quitting: 41.6  . Smokeless tobacco: Never Used  Substance and Sexual Activity  . Alcohol use: Yes  . Drug use: Not on file  . Sexual activity: Not on file  Other Topics Concern  . Not on file  Social History Narrative  . Not on file   Social Determinants of Health   Financial Resource Strain:   . Difficulty of Paying Living Expenses: Not  on file  Food Insecurity:   . Worried About Charity fundraiser in the Last Year: Not on file  . Ran Out of Food in the Last Year: Not on file  Transportation Needs:   . Lack of Transportation (Medical): Not on file  . Lack of Transportation (Non-Medical): Not on file  Physical Activity:   . Days of Exercise per Week: Not on file  . Minutes of Exercise per Session: Not on file  Stress:   . Feeling of Stress : Not on file  Social Connections:   . Frequency of Communication with Friends and Family: Not on file  . Frequency of Social Gatherings with Friends and Family: Not on file  . Attends Religious Services: Not on file  . Active Member of Clubs or Organizations: Not on file  . Attends Archivist Meetings: Not on file  . Marital Status: Not on file  Intimate Partner Violence:   . Fear of Current or Ex-Partner: Not on file  . Emotionally Abused: Not on file  . Physically Abused: Not on file  . Sexually Abused: Not on file    History reviewed. No pertinent family history.     Objective: Vitals:   12/23/19 1614  BP: (!) 196/83  Pulse: 73  Temp: 97.7 F (36.5 C)   He reports that his BP is generally lower at home.   Physical Exam  Lab Results:  Results for orders placed or performed in visit on 12/23/19 (from the past 24 hour(s))  Urinalysis, Routine w reflex microscopic     Status: Abnormal   Collection Time: 12/23/19  3:55 PM  Result Value Ref Range   Specific Gravity, UA 1.015 1.005 - 1.030   pH, UA 5.0 5.0 - 7.5   Color, UA Yellow Yellow   Appearance Ur Clear Clear   Leukocytes,UA Negative Negative   Protein,UA Negative Negative/Trace   Glucose, UA Negative Negative   Ketones, UA Negative Negative   RBC, UA 1+ (A) Negative   Bilirubin, UA Negative Negative   Urobilinogen, Ur 0.2 0.2 - 1.0 mg/dL   Nitrite, UA Negative Negative   Microscopic Examination See below:    Narrative   Performed at:  [facility]  [place],  [place], [place]  [id number] Lab Director: Mina Marble MT, Phone:  [phone]  Microscopic Examination     Status: Abnormal   Collection Time: 12/23/19  3:55 PM   Urine  Result Value Ref Range   WBC, UA None seen 0 - 5 /hpf   RBC 3-10 (A) 0 - 2 /hpf   Epithelial Cells (non renal) None seen 0 - 10 /hpf   Renal Epithel, UA None seen None seen /hpf   Mucus, UA Present Not Estab.   Bacteria, UA None seen None seen/Few   Narrative   Performed at:  [facility] [address], [place], [place]  [id number] Lab Director: [place], Phone:  [phone]    BMET No results for input(s): NA, K, CL, CO2, GLUCOSE, BUN, CREATININE, CALCIUM in the last 72 hours. PSA PSA  Date Value Ref Range Status  11/25/2019 5.9 (H) < OR = 4.0 ng/mL Final    Comment:    The total PSA value from this assay system is  standardized against the WHO standard. The test  result will be approximately 20% lower when compared  to the equimolar-standardized total PSA (Beckman  Coulter). Comparison of serial PSA results should be  interpreted with this fact in mind. . This test was performed using the Siemens  chemiluminescent method. Values obtained from  different assay methods cannot be used interchangeably. PSA levels, regardless of value, should not be interpreted as absolute evidence of the presence or absence of disease.   06/17/2019 4.4 (H) < OR = 4.0 ng/mL Final    Comment:    The total PSA value from this assay system is  standardized against the WHO standard. The test  result will be approximately 20% lower when compared  to the equimolar-standardized total PSA (Beckman  Coulter). Comparison of serial PSA results should be  interpreted with this fact in mind. . This test was performed using the Siemens  chemiluminescent method. Values obtained from  different assay methods cannot be used interchangeably. PSA levels, regardless of value, should not be interpreted as  absolute evidence of the presence or absence of disease.    No results found for: TESTOSTERONE    Studies/Results: No results found.    Assessment & Plan: Prostate cancer.  He is doing well on surveillance but the PSA is back up slightly and the MRI demonstrates a PIRADS 4 lesion on the left.Marland Kitchen  He will need to have an MR fusion biopsy.   I have reviewed the risks of bleeding, infection and difficulty voiding.    Microhematuria .  He has 3-10 RBC's on UA today.   I will get him set up for a CT hematuria study and have cystoscopy at the time of the MR fusion biopsy in [place].   ED.  He is not interested in therapy.       Meds ordered this encounter  Medications  . levofloxacin (LEVAQUIN) 750 MG tablet    Sig: Take 1 tablet (750 mg total) by mouth daily. Take one hour prior to biopsy.    Dispense:  1 tablet    Refill:  0     Orders Placed This Encounter  Procedures  . Microscopic Examination  . CT HEMATURIA WORKUP    Standing Status:   Future    Standing Expiration Date:   01/23/2020    Order Specific Question:   Reason for Exam (SYMPTOM  OR DIAGNOSIS REQUIRED)    Answer:  microhematuria    Order Specific Question:   Preferred imaging location?    Answer:   [facility]    Order Specific Question:   Radiology Contrast Protocol - do NOT remove file path    Answer:   [email or web address]  . Urinalysis, Routine w reflex microscopic  . Ambulatory referral to Urology    Referral Priority:   Routine    Referral Type:   Consultation    Referral Reason:   Specialty Services Required    Referred to Provider:   Irine Seal, MD    Requested Specialty:   Urology    Number of Visits Requested:   1      Return for with the results of the MR Fusion biopsy to discuss prostate cancer management. .   CC: Monico Blitz, MD      Irine Seal 12/23/2019 .

## 2019-12-23 ENCOUNTER — Other Ambulatory Visit: Payer: Self-pay

## 2019-12-23 ENCOUNTER — Ambulatory Visit (INDEPENDENT_AMBULATORY_CARE_PROVIDER_SITE_OTHER): Payer: Medicare PPO | Admitting: Urology

## 2019-12-23 ENCOUNTER — Encounter: Payer: Self-pay | Admitting: Urology

## 2019-12-23 VITALS — BP 196/83 | HR 73 | Temp 97.7°F | Ht 73.0 in | Wt 214.0 lb

## 2019-12-23 DIAGNOSIS — C61 Malignant neoplasm of prostate: Secondary | ICD-10-CM

## 2019-12-23 DIAGNOSIS — R972 Elevated prostate specific antigen [PSA]: Secondary | ICD-10-CM | POA: Diagnosis not present

## 2019-12-23 DIAGNOSIS — R3129 Other microscopic hematuria: Secondary | ICD-10-CM

## 2019-12-23 LAB — URINALYSIS, ROUTINE W REFLEX MICROSCOPIC
Bilirubin, UA: NEGATIVE
Glucose, UA: NEGATIVE
Ketones, UA: NEGATIVE
Leukocytes,UA: NEGATIVE
Nitrite, UA: NEGATIVE
Protein,UA: NEGATIVE
Specific Gravity, UA: 1.015 (ref 1.005–1.030)
Urobilinogen, Ur: 0.2 mg/dL (ref 0.2–1.0)
pH, UA: 5 (ref 5.0–7.5)

## 2019-12-23 LAB — MICROSCOPIC EXAMINATION
Bacteria, UA: NONE SEEN
Epithelial Cells (non renal): NONE SEEN /hpf (ref 0–10)
Renal Epithel, UA: NONE SEEN /hpf
WBC, UA: NONE SEEN /hpf (ref 0–5)

## 2019-12-23 MED ORDER — LEVOFLOXACIN 750 MG PO TABS: 750.0000 mg | ORAL_TABLET | Freq: Every day | ORAL | 0 refills | Status: DC

## 2019-12-23 NOTE — Progress Notes (Signed)
Urological Symptom Review  Patient is experiencing the following symptoms: Stream starts and stops Trouble starting stream Weak stream Erection problems (male only)   Review of Systems  Gastrointestinal (upper)  : Negative for upper GI symptoms  Gastrointestinal (lower) : Negative for lower GI symptoms  Constitutional : Negative for symptoms  Skin: Negative for skin symptoms  Eyes: Negative for eye symptoms  Ear/Nose/Throat : Negative for Ear/Nose/Throat symptoms  Hematologic/Lymphatic: Negative for Hematologic/Lymphatic symptoms  Cardiovascular : Negative for cardiovascular symptoms  Respiratory : Negative for respiratory symptoms  Endocrine: Negative for endocrine symptoms  Musculoskeletal: Negative for musculoskeletal symptoms  Neurological: Negative for neurological symptoms  Psychologic: Negative for psychiatric symptoms

## 2020-01-19 ENCOUNTER — Other Ambulatory Visit: Payer: Self-pay

## 2020-01-19 ENCOUNTER — Ambulatory Visit (HOSPITAL_COMMUNITY)
Admission: RE | Admit: 2020-01-19 | Discharge: 2020-01-19 | Disposition: A | Discharge status: Home / Self Care | Payer: Medicare PPO | Source: Ambulatory Visit | Attending: Urology | Admitting: Urology

## 2020-01-19 DIAGNOSIS — R3129 Other microscopic hematuria: Secondary | ICD-10-CM | POA: Diagnosis not present

## 2020-01-19 LAB — POCT I-STAT CREATININE: Creatinine, Ser: 1 mg/dL (ref 0.61–1.24)

## 2020-01-19 IMAGING — CT CT ABD-PEL WO/W CM
3 of 12 series · 11 of 46 positions shown, 17 images · IV contrast (Omnipaque or Isovue)
Comparison: None.

CLINICAL DATA: Microscopic hematuria.

EXAM:
CT ABDOMEN AND PELVIS WITHOUT AND WITH CONTRAST
TECHNIQUE: Multidetector CT imaging of the abdomen and pelvis was performed
following the standard protocol before and following the bolus
administration of intravenous contrast.
CONTRAST:  100mL OMNIPAQUE IOHEXOL 300 MG/ML  SOLN

[Series 2: axial pre · axial · non-contrast · 0.93mm/px · z∈[+634,+979]mm · 4 of 116 slices shown]
[im 24/116  soft-tissue]
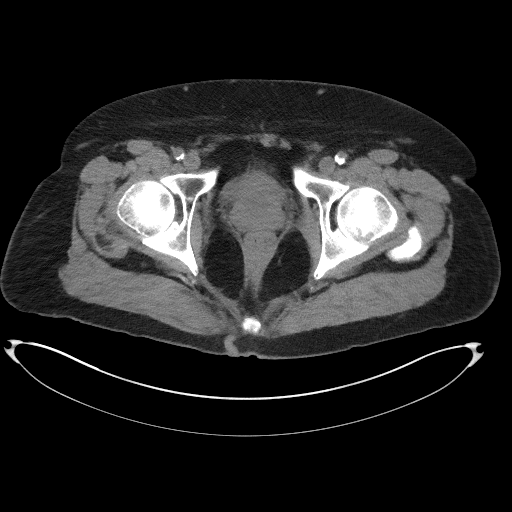
[im 47/116  soft-tissue]
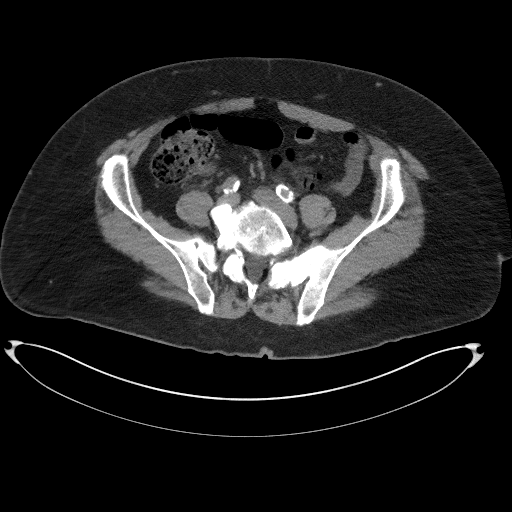
[im 70/116  soft-tissue]
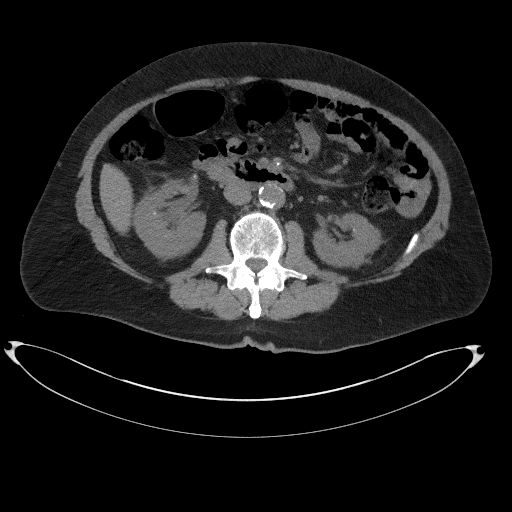
[im 93/116  soft-tissue]
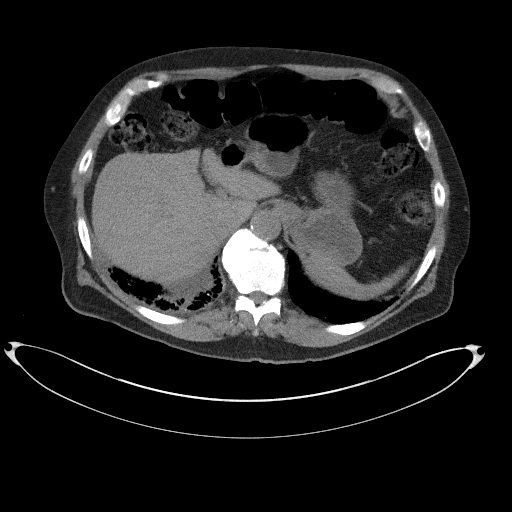

[Series 5: coronal pre · coronal · non-contrast · 0.92mm/px · 2 of 116 slices shown, 3 images]
[im 39/116  soft-tissue]
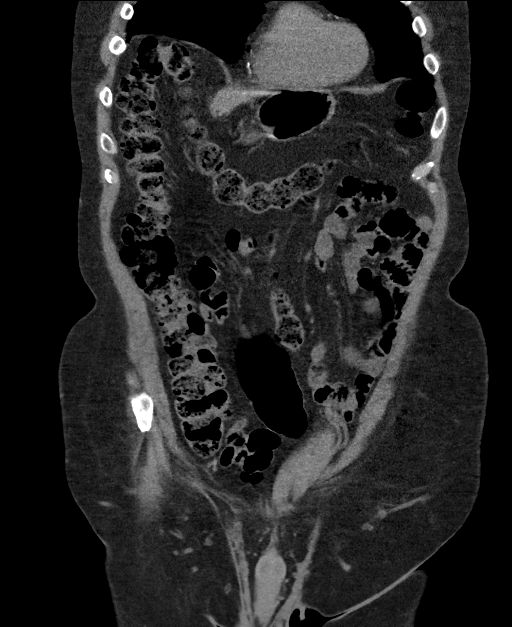
[im 39/116  bone]
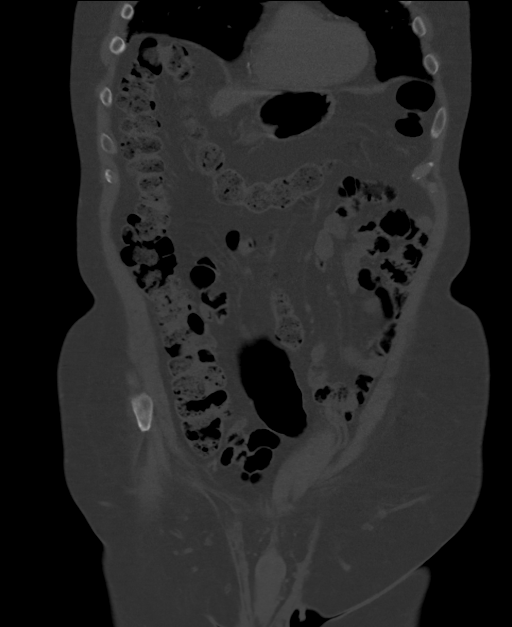
[im 77/116  soft-tissue]
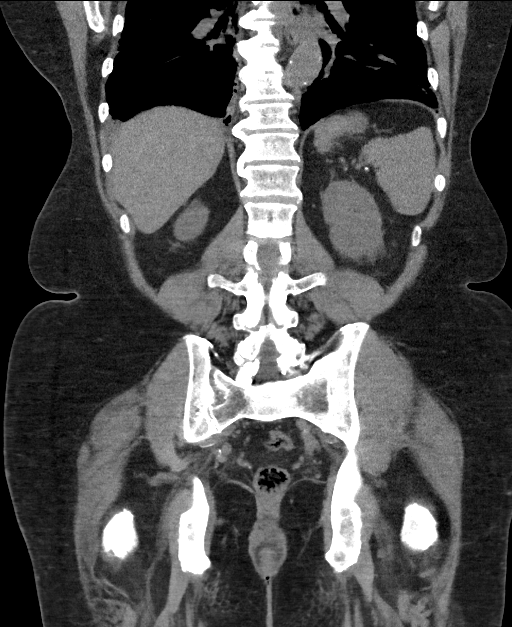

[Series 7: axial post · axial · 0.93mm/px · z∈[+609,+994]mm · 5 of 117 slices shown, 10 images]
[im 20/117  soft-tissue]
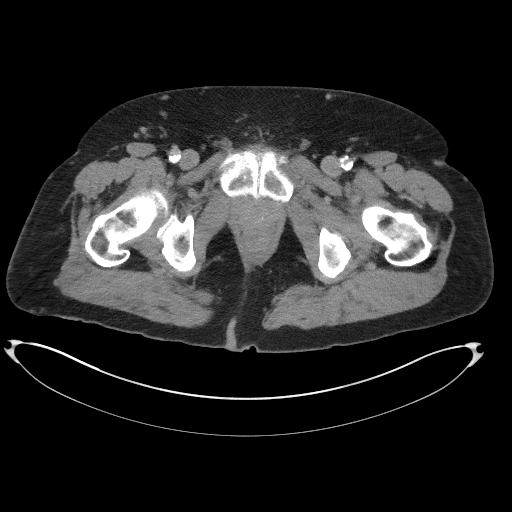
[im 20/117  bone]
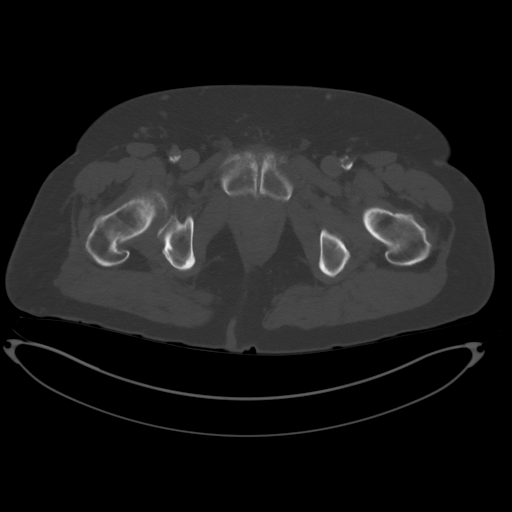
[im 39/117  soft-tissue]
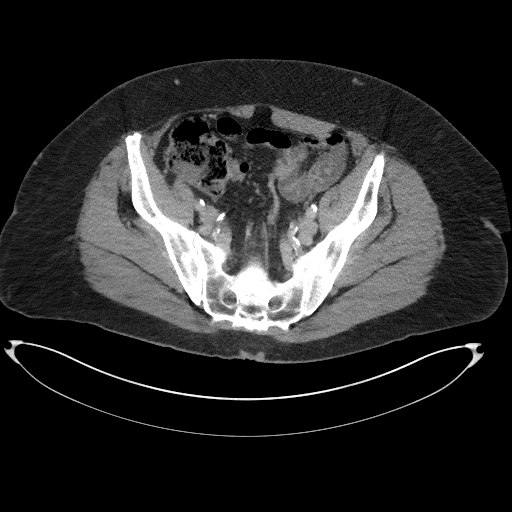
[im 39/117  lung]
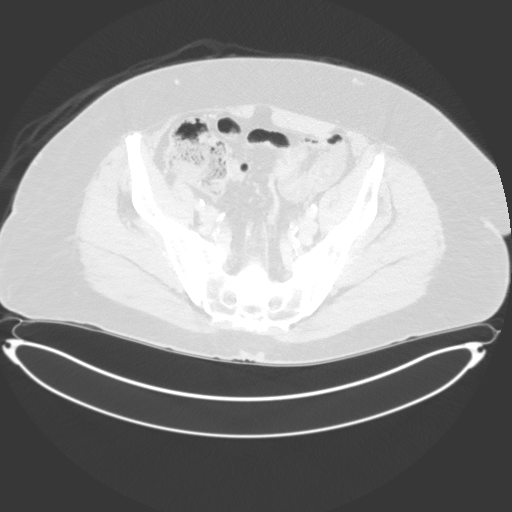
[im 59/117  soft-tissue]
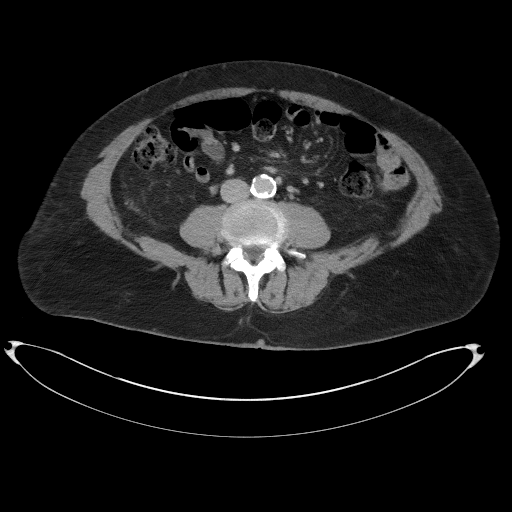
[im 59/117  lung]
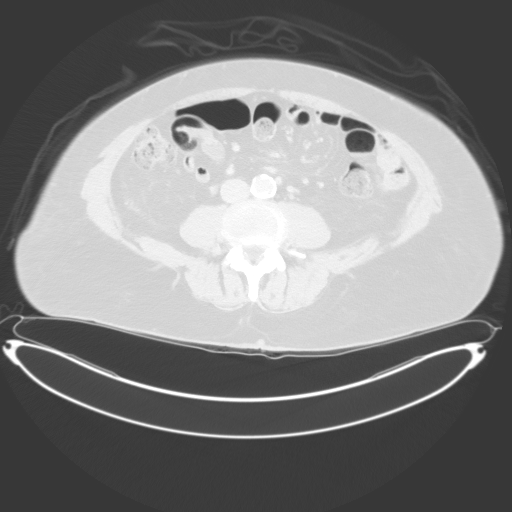
[im 78/117  soft-tissue]
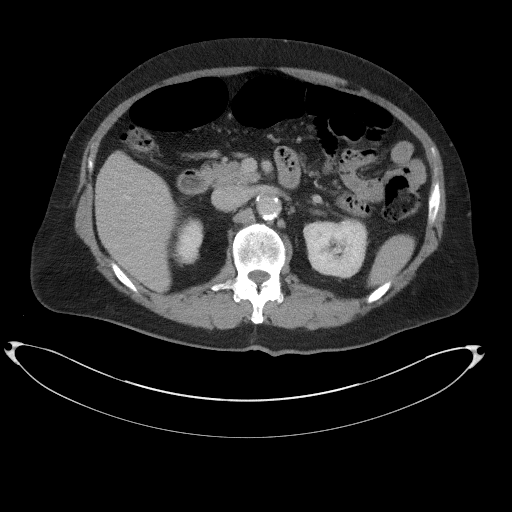
[im 78/117  lung]
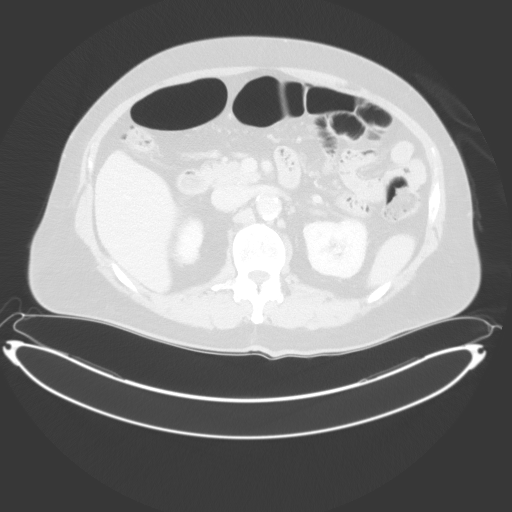
[im 97/117  soft-tissue]
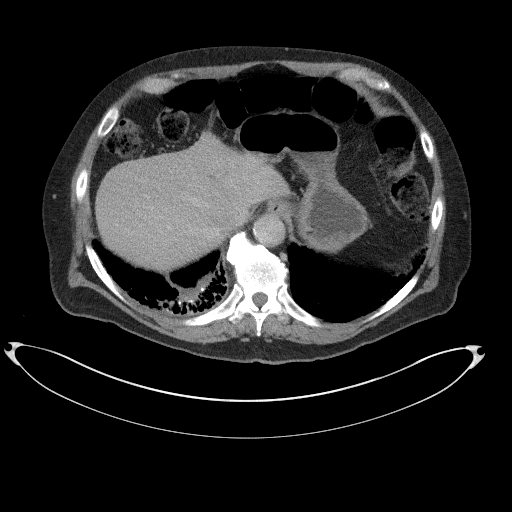
[im 97/117  lung]
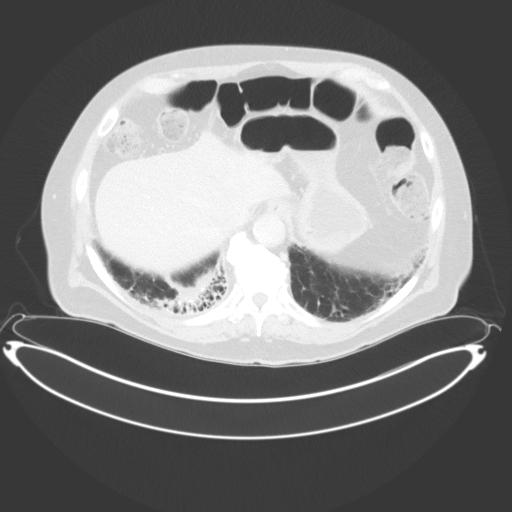

[11 of 46 positions shown; findings below may reference images not displayed]

FINDINGS: Lower chest: Rounded consolidation at the medial RIGHT lung base
with bronchiectasis along the pleural surfaces most suggestive
rounded atelectasis. Mild bronchiectasis at the LEFT lung base. No
suspicious nodularity.

Hepatobiliary: No focal hepatic lesion. No biliary duct dilatation.
Common bile duct is normal.

Pancreas: Pancreas is normal. No ductal dilatation. No pancreatic
inflammation.

Spleen: Normal spleen

Adrenals/urinary tract: Adrenal glands normal.

Minimally obstructing calculus in the proximal RIGHT ureter measures
6 mm at the RIGHT ureteropelvic junction. Calculus seen on image
48/2 and image [DATE]/coronal. Mild pelvicaliectasis in the RIGHT
kidney. Punctate bilateral renal calcifications (1 per kidney).

Stomach/Bowel: Stomach, small bowel, appendix, and cecum are normal.
The colon and rectosigmoid colon are normal.

Vascular/Lymphatic: Abdominal aorta is normal caliber with
atherosclerotic calcification. There is no retroperitoneal or
periportal lymphadenopathy. No pelvic lymphadenopathy.

Reproductive: Prostate normal.

Other: No free fluid.

Musculoskeletal: No aggressive osseous lesion.
IMPRESSION: 1. Mildly obstructing calculus at the RIGHT ureteropelvic junction.
2. Bilateral punctate nephrolithiasis.
3. No bladder calculi.
4. Consolidation at the medial RIGHT lung base is favored rounded
atelectasis. As no remote comparison available, consider follow-up
CT in 3 months to demonstrate size stability.

## 2020-01-19 MED ORDER — IOHEXOL 300 MG/ML  SOLN
100.0000 mL | Freq: Once | INTRAMUSCULAR | Status: AC | PRN
  Administered 2020-01-19: 100 mL via INTRAVENOUS

## 2020-01-20 ENCOUNTER — Other Ambulatory Visit: Payer: Self-pay | Admitting: Urology

## 2020-01-20 ENCOUNTER — Telehealth: Payer: Self-pay

## 2020-01-20 DIAGNOSIS — N201 Calculus of ureter: Secondary | ICD-10-CM

## 2020-01-20 DIAGNOSIS — J9811 Atelectasis: Secondary | ICD-10-CM

## 2020-01-20 DIAGNOSIS — R918 Other nonspecific abnormal finding of lung field: Secondary | ICD-10-CM

## 2020-01-20 NOTE — Telephone Encounter (Signed)
Called pt to notify him of kidney stone, to get KUB and in 3 months the Ct and a the f/u appt. Pt kept asking about a biopsy. I explained to pt that for now to just get these things done and then address the biopsy after this. Pt expressed understanding.

## 2020-01-20 NOTE — Progress Notes (Signed)
He has a 53mm stone at the right UPJ with minimal obstruction.  This is the likely cause of the hematuria and he will not need cystoscopy, but he does need a KUB and will need a Chest CT in 3 months because of some changes that are most consistent with atelectasis, which is a localized area of uninflated lung  at the right lung base.

## 2020-01-20 NOTE — Telephone Encounter (Signed)
-----   Message from Irine Seal, MD sent at 01/20/2020 12:38 PM EDT ----- He has a 89mm stone at the right UPJ with minimal obstruction.  This is the likely cause of the hematuria and he will not need cystoscopy, but he does need a KUB and will need a Chest CT in 3 months because of some changes that are most consistent with atelectasis, which is a localized area of uninflated lung  at the right lung base.

## 2020-01-23 ENCOUNTER — Ambulatory Visit (HOSPITAL_COMMUNITY)
Admission: RE | Admit: 2020-01-23 | Discharge: 2020-01-23 | Disposition: A | Discharge status: Home / Self Care | Payer: Medicare PPO | Source: Ambulatory Visit | Attending: Urology | Admitting: Urology

## 2020-01-23 ENCOUNTER — Other Ambulatory Visit: Payer: Self-pay

## 2020-01-23 DIAGNOSIS — N201 Calculus of ureter: Secondary | ICD-10-CM | POA: Insufficient documentation

## 2020-01-23 IMAGING — DX DG ABDOMEN 1V
2 series · 2 of 2 positions shown · non-contrast
Comparison: CT abdomen pelvis [DATE].

CLINICAL DATA: Right ureteropelvic junction stone on recent CT.
Right flank pain. History of prostate malignancy.

EXAM:
ABDOMEN - 1 VIEW

[abdomen kub (1 of 2)]
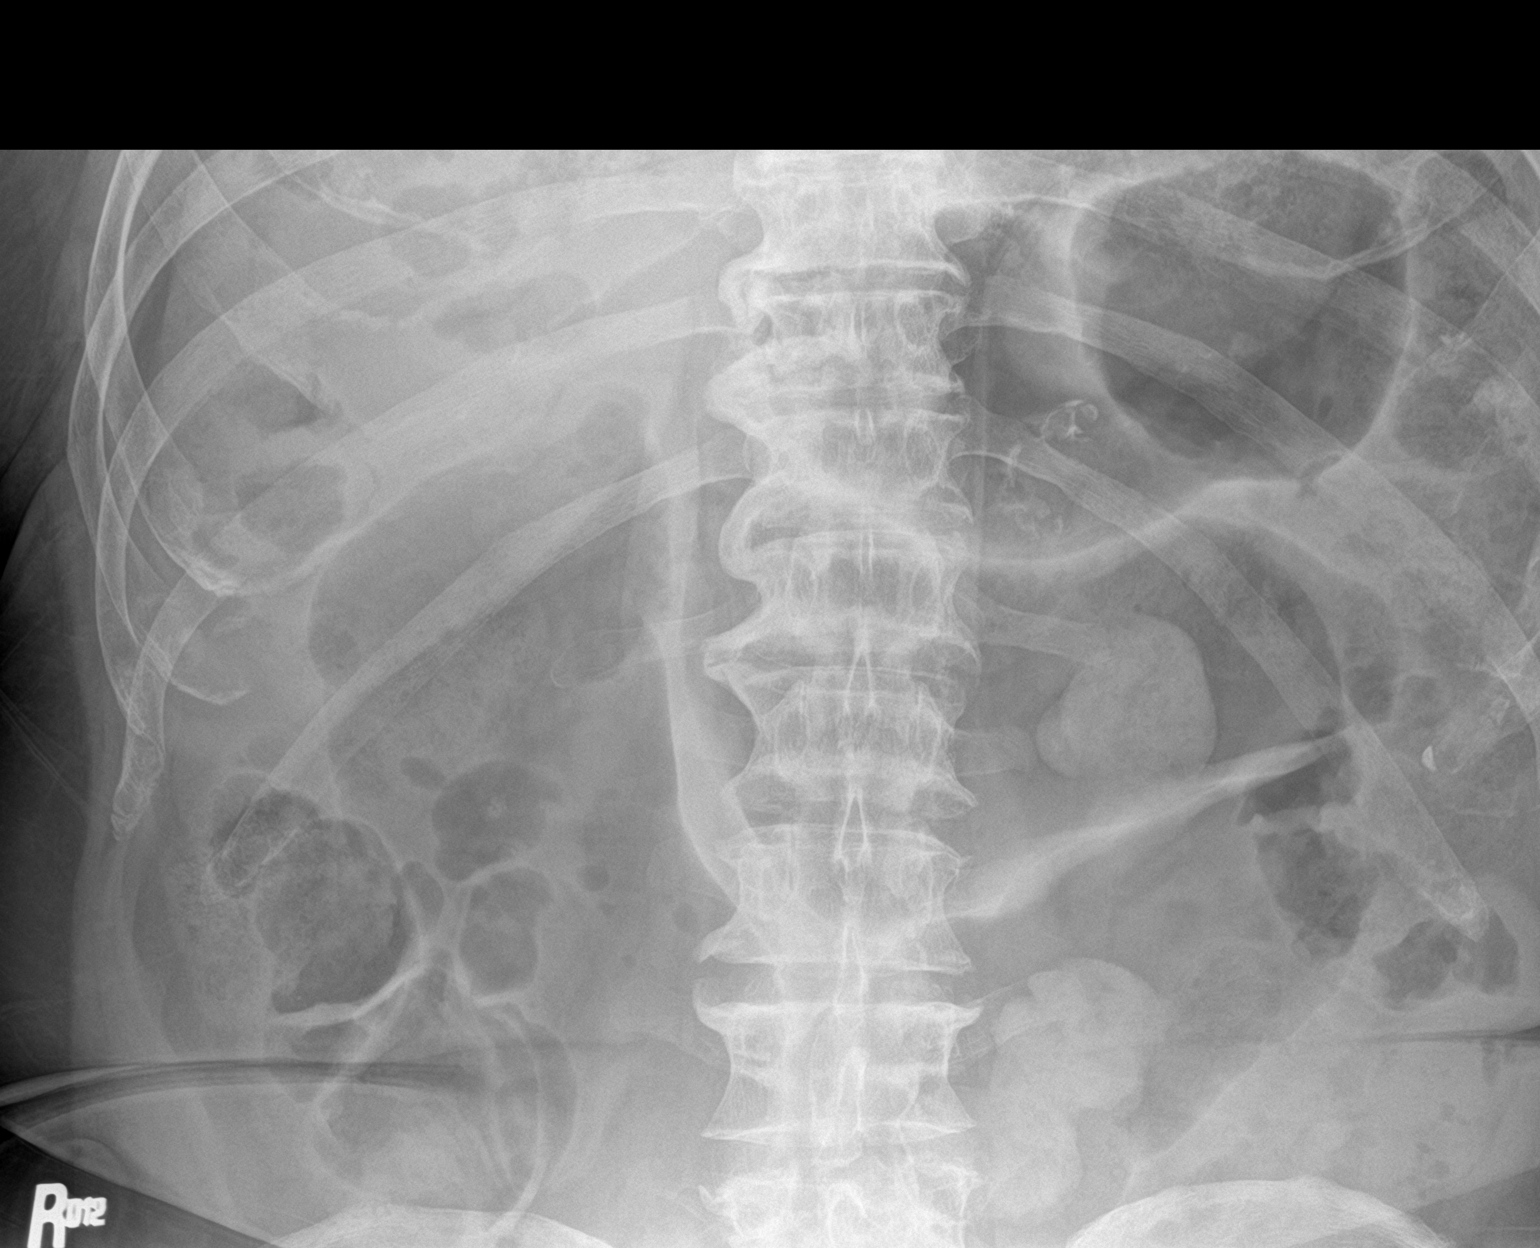

[abdomen kub (2 of 2)]
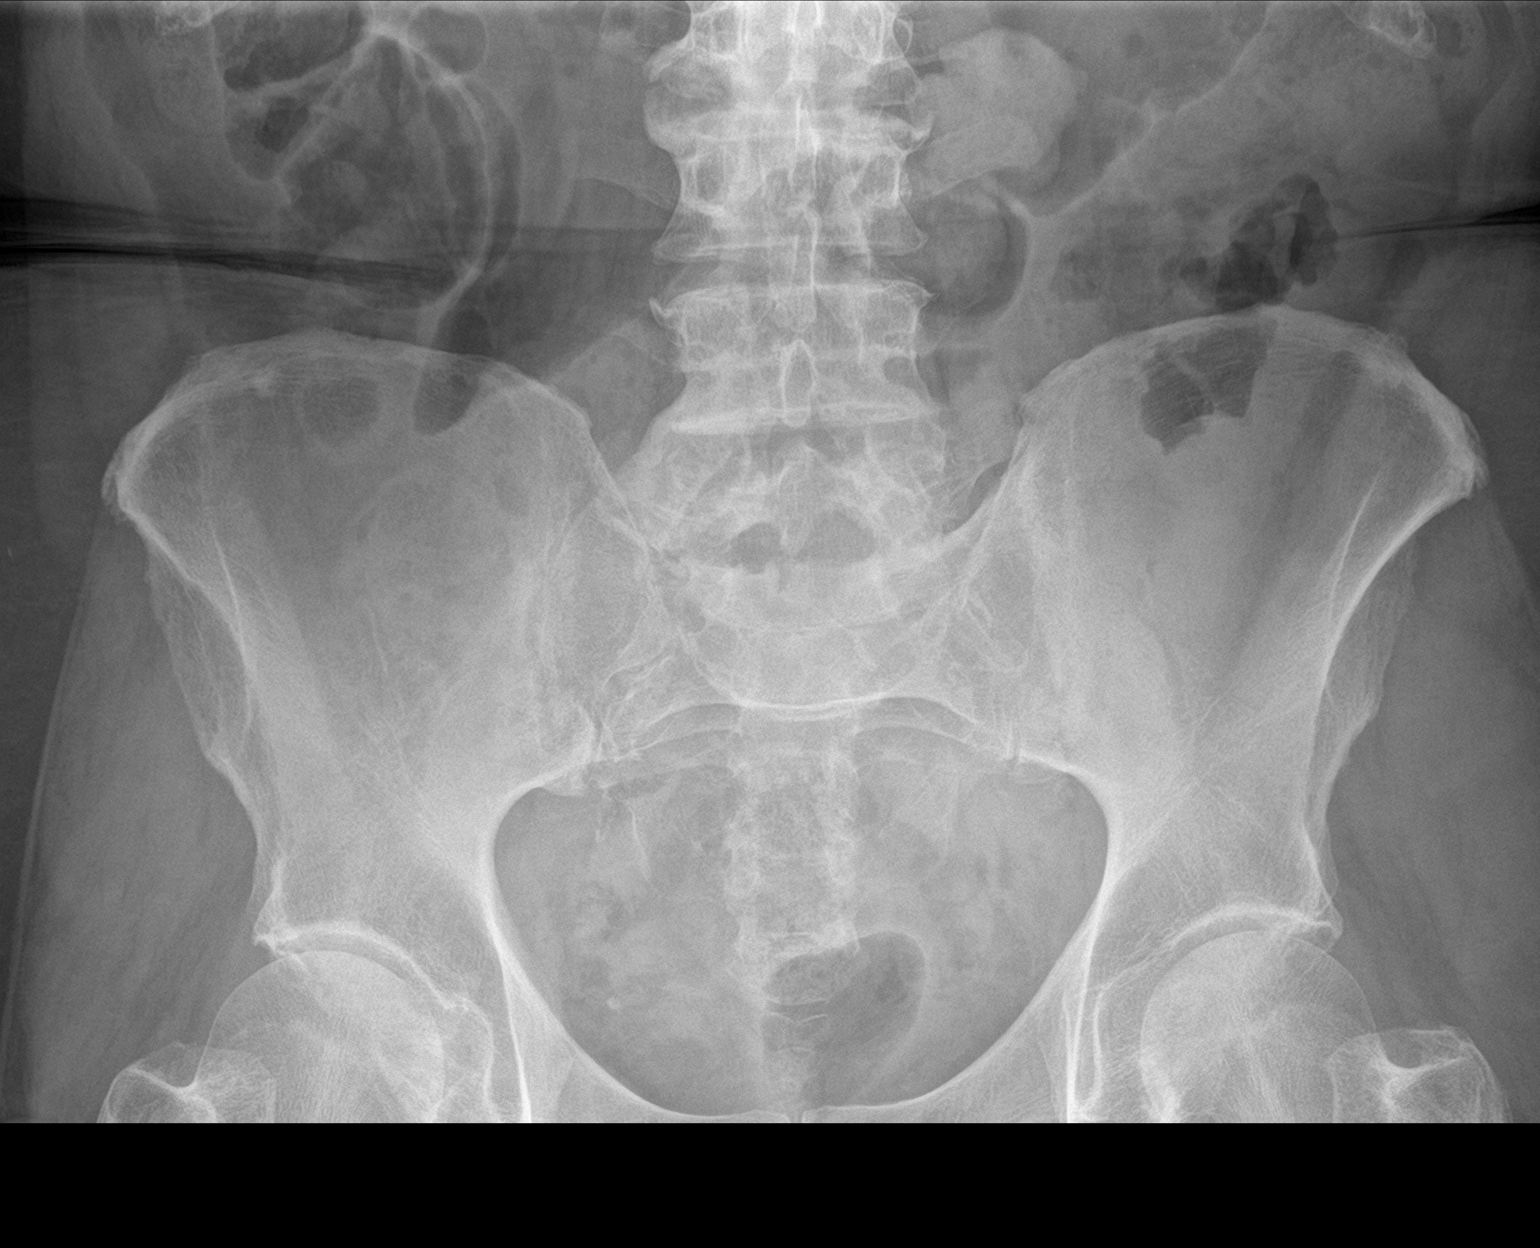

[2 of 2 positions shown; findings below may reference images not displayed]

FINDINGS: Suggestion of large bowel gaseous dilatation. The renal shadows are
not visualized due to overlying gas-filled bowel. Limited evaluation
for free intraperitoneal gas on this supine abdomen.
IMPRESSION: 1. Suggestion of large bowel dilatation.
2. Renal shadows not visualized due to overlying bowel gas.
3. Consider CT for further evaluation.

These results will be called to the ordering clinician or
representative by the Radiologist Assistant, and communication
documented in the PACS or [REDACTED].

## 2020-01-24 ENCOUNTER — Telehealth: Payer: Self-pay

## 2020-01-24 DIAGNOSIS — N201 Calculus of ureter: Secondary | ICD-10-CM

## 2020-01-24 NOTE — Telephone Encounter (Signed)
Pt notified and wish to proceed with KUB. Pt states he will go have KUB done when he comes back from out of town after 02/08/20.

## 2020-01-24 NOTE — Addendum Note (Signed)
Addended byIris Pert on: 01/24/2020 12:08 PM   Modules accepted: Orders

## 2020-01-24 NOTE — Telephone Encounter (Signed)
-----   Message from Irine Seal, MD sent at 01/24/2020 10:04 AM EDT ----- I think I see the stone on the KUB but it is not clear.  If he having pain we could consider treatment with ureteroscopy or lithotripsy but if he is not having pain, I would like to get a KUB in 2 weeks.   His colon is somewhat dilated with gas which can be from an ileus.  If he is constipated, he should use a dulcolax suppository.

## 2020-01-24 NOTE — Telephone Encounter (Signed)
Called left message to return call.

## 2020-02-15 ENCOUNTER — Other Ambulatory Visit: Payer: Self-pay

## 2020-02-15 ENCOUNTER — Ambulatory Visit (HOSPITAL_COMMUNITY)
Admission: RE | Admit: 2020-02-15 | Discharge: 2020-02-15 | Disposition: A | Discharge status: Home / Self Care | Payer: Medicare PPO | Source: Ambulatory Visit | Attending: Urology | Admitting: Urology

## 2020-02-15 DIAGNOSIS — N201 Calculus of ureter: Secondary | ICD-10-CM | POA: Insufficient documentation

## 2020-02-15 IMAGING — DX DG ABDOMEN 1V
2 series · 2 of 2 positions shown · non-contrast
Comparison: [DATE] [DATE], [DATE].  [DATE] [DATE], [DATE].

CLINICAL DATA: Right kidney stone.  Right flank pain.

EXAM:
ABDOMEN - 1 VIEW

[abdomen kub (1 of 2)]
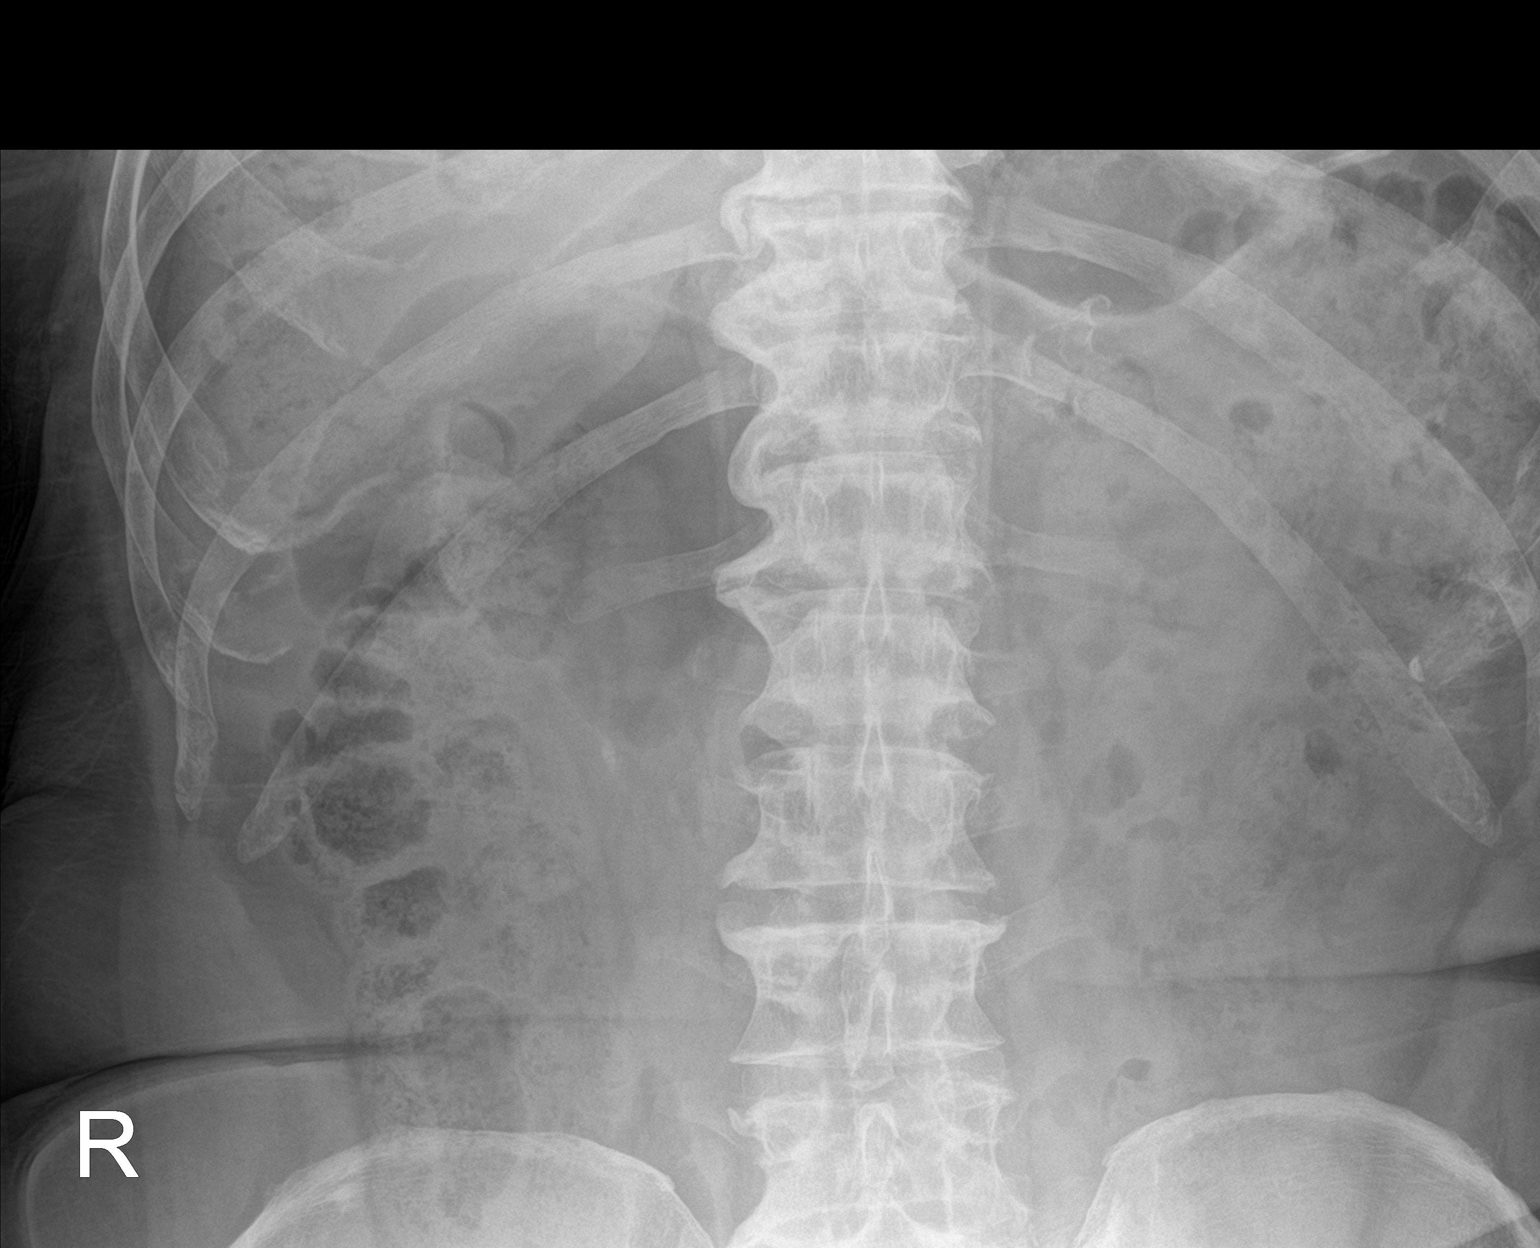

[abdomen kub (2 of 2)]
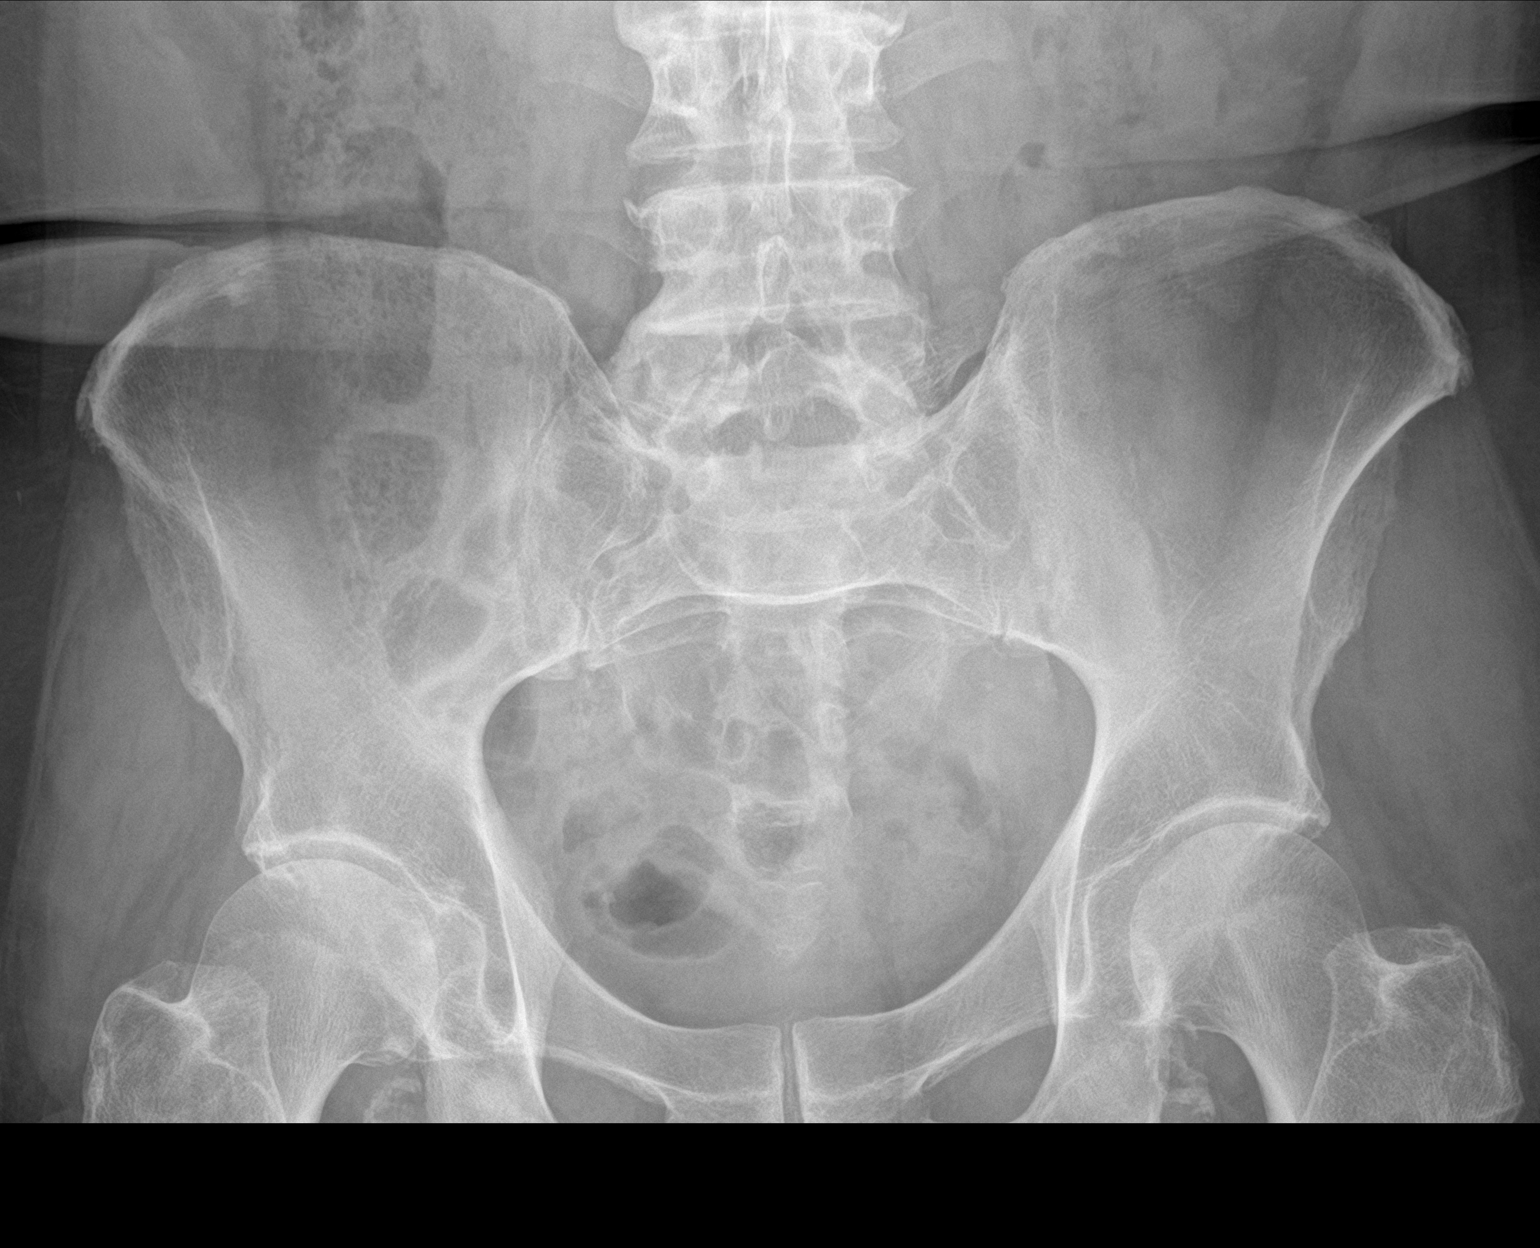

[2 of 2 positions shown; findings below may reference images not displayed]

FINDINGS: The bowel gas pattern is normal. Small calculus is seen to the right
of the L2-3 disc space which corresponds to ureteropelvic junction
calculus noted on prior CT scan.
IMPRESSION: Small calculus seen to the right of the L2-3 disc space which
corresponds to ureteropelvic junction calculus noted on prior CT
scan.

## 2020-02-16 ENCOUNTER — Other Ambulatory Visit: Payer: Self-pay

## 2020-02-16 DIAGNOSIS — N201 Calculus of ureter: Secondary | ICD-10-CM

## 2020-02-20 ENCOUNTER — Telehealth: Payer: Self-pay

## 2020-02-20 NOTE — Telephone Encounter (Signed)
Pt notified of ov with Dr. Alyson Ingles on Oct 22nd.

## 2020-02-20 NOTE — Telephone Encounter (Signed)
-----   Message from Irine Seal, MD sent at 02/17/2020  4:52 PM EDT ----- He still has a stone visible on KUB and will probably need ESWL. He doesn't have f/u with me until December.   Does Saralyn Pilar have any openings next week.    ----- Message ----- From: Iris Pert, LPN Sent: [phone]   4:17 PM EDT To: Irine Seal, MD  Please review

## 2020-02-20 NOTE — Telephone Encounter (Signed)
Pt notified of KUB and that Lorin Mercy will be calling

## 2020-02-20 NOTE — Telephone Encounter (Signed)
-----   Message from Dorisann Frames, RN sent at 02/20/2020  2:38 PM EDT ----- This patient will need an OV first is what Dr. Jeffie Pollock meant. No surgery can be done until pt is seen and surgery is discussed. ----- Message ----- From: Valentina Lucks, LPN Sent: [phone]/[phone]  11:38 AM EDT To: Irine Seal, MD, Dorisann Frames, RN  Estill Bamberg says he can do it in 2 weeks. I will notify pt. ----- Message ----- From: Irine Seal, MD Sent: 02/17/2020   4:52 PM EDT To: Iris Pert, LPN, [facility]  He still has a stone visible on KUB and will probably need ESWL. He doesn't have f/u with me until December.   Does Saralyn Pilar have any openings next week.    ----- Message ----- From: Iris Pert, LPN Sent: 35/36/1443   4:17 PM EDT To: Irine Seal, MD  Please review

## 2020-02-23 ENCOUNTER — Telehealth: Payer: Self-pay

## 2020-02-23 ENCOUNTER — Other Ambulatory Visit: Payer: Self-pay

## 2020-02-23 DIAGNOSIS — C61 Malignant neoplasm of prostate: Secondary | ICD-10-CM

## 2020-02-23 NOTE — Telephone Encounter (Signed)
-----   Message from Irine Seal, MD sent at 02/17/2020 12:48 PM EDT ----- His path report went to [facility] because we did the fusion biopsy there.   We need to get a copy in Epic.   He needs to have a PSA prior to that visit and see me then.  I called him with the results and felt that continued surveillance was appropriate.   We can review options then.  Just give me a little more time for the appointment.  ----- Message ----- From: Dorisann Frames, RN Sent: 02/16/2020   1:59 PM EDT To: Irine Seal, MD  I called patient to tell him about KUB for December. He is very confused on what the plan is for his prostate cancer? He is asking does he need to see you before to discuss treatment options. Please advise. Thanks, Estill Bamberg

## 2020-02-23 NOTE — Telephone Encounter (Signed)
Path repot requested from [facility].  PSA order placed. Lab appt mailed.

## 2020-02-24 ENCOUNTER — Encounter: Payer: Self-pay | Admitting: Urology

## 2020-02-24 ENCOUNTER — Ambulatory Visit: Payer: Medicare PPO | Admitting: Urology

## 2020-02-24 ENCOUNTER — Ambulatory Visit (INDEPENDENT_AMBULATORY_CARE_PROVIDER_SITE_OTHER): Payer: Medicare PPO | Admitting: Urology

## 2020-02-24 ENCOUNTER — Other Ambulatory Visit: Payer: Self-pay

## 2020-02-24 VITALS — BP 177/98 | HR 88 | Temp 98.0°F | Ht 72.0 in | Wt 214.0 lb

## 2020-02-24 DIAGNOSIS — N201 Calculus of ureter: Secondary | ICD-10-CM | POA: Diagnosis not present

## 2020-02-24 NOTE — Progress Notes (Signed)

## 2020-02-24 NOTE — Patient Instructions (Signed)
Lithotripsy  Lithotripsy is a treatment that can sometimes help eliminate kidney stones and the pain that they cause. A form of lithotripsy, also known as extracorporeal shock wave lithotripsy, is a nonsurgical procedure that crushes a kidney stone with shock waves. These shock waves pass through your body and focus on the kidney stone. They cause the kidney stone to break up while it is still in the urinary tract. This makes it easier for the smaller pieces of stone to pass in the urine. Tell a health care provider about:  Any allergies you have.  All medicines you are taking, including vitamins, herbs, eye drops, creams, and over-the-counter medicines.  Any blood disorders you have.  Any surgeries you have had.  Any medical conditions you have.  Whether you are pregnant or may be pregnant.  Any problems you or family members have had with anesthetic medicines. What are the risks? Generally, this is a safe procedure. However, problems may occur, including:  Infection.  Bleeding of the kidney.  Bruising of the kidney or skin.  Scarring of the kidney, which can lead to: ? Increased blood pressure. ? Poor kidney function. ? Return (recurrence) of kidney stones.  Damage to other structures or organs, such as the liver, colon, spleen, or pancreas.  Blockage (obstruction) of the the tube that carries urine from the kidney to the bladder (ureter).  Failure of the kidney stone to break into pieces (fragments). What happens before the procedure? Staying hydrated Follow instructions from your health care provider about hydration, which may include:  Up to 2 hours before the procedure - you may continue to drink clear liquids, such as water, clear fruit juice, black coffee, and plain tea. Eating and drinking restrictions Follow instructions from your health care provider about eating and drinking, which may include:  8 hours before the procedure - stop eating heavy meals or foods  such as meat, fried foods, or fatty foods.  6 hours before the procedure - stop eating light meals or foods, such as toast or cereal.  6 hours before the procedure - stop drinking milk or drinks that contain milk.  2 hours before the procedure - stop drinking clear liquids. General instructions  Plan to have someone take you home from the hospital or clinic.  Ask your health care provider about: ? Changing or stopping your regular medicines. This is especially important if you are taking diabetes medicines or blood thinners. ? Taking medicines such as aspirin and ibuprofen. These medicines and other NSAIDs can thin your blood. Do not take these medicines for 7 days before your procedure if your health care provider instructs you not to.  You may have tests, such as: ? Blood tests. ? Urine tests. ? Imaging tests, such as a CT scan. What happens during the procedure?  To lower your risk of infection: ? Your health care team will wash or sanitize their hands. ? Your skin will be washed with soap.  An IV tube will be inserted into one of your veins. This tube will give you fluids and medicines.  You will be given one or more of the following: ? A medicine to help you relax (sedative). ? A medicine to make you fall asleep (general anesthetic).  A water-filled cushion may be placed behind your kidney or on your abdomen. In some cases you may be placed in a tub of lukewarm water.  Your body will be positioned in a way that makes it easy to target the kidney  stone.  A flexible tube with holes in it (stent) may be placed in the ureter. This will help keep urine flowing from the kidney if the fragments of the stone have been blocking the ureter.  An X-ray or ultrasound exam will be done to locate your stone.  Shock waves will be aimed at the stone. If you are awake, you may feel a tapping sensation as the shock waves pass through your body. The procedure may vary among health care  providers and hospitals. What happens after the procedure?  You may have an X-ray to see whether the procedure was able to break up the kidney stone and how much of the stone has passed. If large stone fragments remain after treatment, you may need to have a second procedure at a later time.  Your blood pressure, heart rate, breathing rate, and blood oxygen level will be monitored until the medicines you were given have worn off.  You may be given antibiotics or pain medicine as needed.  If a stent was placed in your ureter during surgery, it may stay in place for a few weeks.  You may need strain your urine to collect pieces of the kidney stone for testing.  You will need to drink plenty of water.  Do not drive for 24 hours if you were given a sedative. Summary  Lithotripsy is a treatment that can sometimes help eliminate kidney stones and the pain that they cause.  A form of lithotripsy, also known as extracorporeal shock wave lithotripsy, is a nonsurgical procedure that crushes a kidney stone with shock waves.  Generally, this is a safe procedure. However, problems may occur, including damage to the kidney or other organs, infection, or obstruction of the tube that carries urine from the kidney to the bladder (ureter).  When you go home, you will need to drink plenty of water. You may be asked to strain your urine to collect pieces of the kidney stone for testing. This information is not intended to replace advice given to you by your health care provider. Make sure you discuss any questions you have with your health care provider. Document Revised: 08/02/2018 Document Reviewed: 03/12/2016 Elsevier Patient Education  2020 [employer].

## 2020-02-24 NOTE — H&P (View-Only) (Signed)
02/24/2020 11:21 AM   Cody Carr 1946-02-14 [id number]  Referring provider: Monico Blitz, MD [address] [place],  [place] [address]  nephrolithiasis  HPI: [name] is a 74yo here for followup for nephrolithiasis. He was evaluated by Dr. Jeffie Pollock for microhematuria and was found to have a 58mm right UPJ calculus. He has very mild intermittent right flank pain. No significant LUTS. No gross hematuria. This is his first stone event.    PMH: Past Medical History:  Diagnosis Date  . Hypertension   . Prostate cancer ([place])   . 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 throat biopsy  2008    Home Medications:  Allergies as of 02/24/2020   No Known Allergies     Medication List       Accurate as of February 24, 2020 11:21 AM. If you have any questions, ask your nurse or doctor.        STOP taking these medications   levofloxacin 750 MG tablet Commonly known as: Levaquin Stopped by: Nicolette Bang, MD     TAKE these medications   aspirin 81 MG chewable tablet Chew by mouth daily.   Biotin 1 MG Caps Take by mouth.   cholecalciferol 25 MCG (1000 UNIT) tablet Commonly known as: VITAMIN D3 Take 1,000 Units by mouth daily.   lisinopril 2.5 MG tablet Commonly known as: ZESTRIL Take 2.5 mg by mouth daily. What changed: Another medication with the same name was removed. Continue taking this medication, and follow the directions you see here. Changed by: Nicolette Bang, MD   multivitamin capsule Take 1 capsule by mouth daily.   vitamin B-12 100 MCG tablet Commonly known as: CYANOCOBALAMIN Take 100 mcg by mouth daily.       Allergies: No Known Allergies  Family History: No family history on file.  Social History:  reports that he quit smoking about 41 years ago. His smoking use included cigarettes. He has a 6.00 pack-year smoking history. He has never used  smokeless tobacco. He reports current alcohol use. No history on file for drug use.  ROS: All other review of systems were reviewed and are negative except what is noted above in HPI  Physical Exam: BP (!) 177/98   Pulse 88   Temp 98 F (36.7 C)   Ht 6' (1.829 m)   Wt 214 lb (97.1 kg)   BMI 29.02 kg/m   Constitutional:  Alert and oriented, No acute distress. HEENT: [name] AT, moist mucus membranes.  Trachea midline, no masses. Cardiovascular: No clubbing, cyanosis, or edema. Respiratory: Normal respiratory effort, no increased work of breathing. GI: Abdomen is soft, nontender, nondistended, no abdominal masses GU: No CVA tenderness.  Lymph: No cervical or inguinal lymphadenopathy. Skin: No rashes, bruises or suspicious lesions. Neurologic: Grossly intact, no focal deficits, moving all 4 extremities. Psychiatric: Normal mood and affect.  Laboratory Data: No results found for: WBC, HGB, HCT, MCV, PLT  Lab Results  Component Value Date   CREATININE 1.00 01/19/2020    Lab Results  Component Value Date   PSA 5.9 (H) 11/25/2019   PSA 4.4 (H) 06/17/2019    No results found for: TESTOSTERONE  No results found for: HGBA1C  Urinalysis    Component Value Date/Time   APPEARANCEUR Clear 12/23/2019 1555   GLUCOSEU Negative 12/23/2019 1555   BILIRUBINUR Negative 12/23/2019 1555   PROTEINUR Negative 12/23/2019 1555  NITRITE Negative 12/23/2019 1555   LEUKOCYTESUR Negative 12/23/2019 1555    Lab Results  Component Value Date   LABMICR See below: 12/23/2019   WBCUA None seen 12/23/2019   LABEPIT None seen 12/23/2019   MUCUS Present 12/23/2019   BACTERIA None seen 12/23/2019    Pertinent Imaging: KUB 02/15/2020: Images reviewed and discussed with the patient Results for orders placed during the hospital encounter of 02/15/20  Abdomen 1 view (KUB)  Narrative CLINICAL DATA:  Right kidney stone.  Right flank pain.  EXAM: ABDOMEN - 1 VIEW  COMPARISON:  January 23, 2020.  January 19, 2020.  FINDINGS: The bowel gas pattern is normal. Small calculus is seen to the right of the L2-3 disc space which corresponds to ureteropelvic junction calculus noted on prior CT scan.  IMPRESSION: Small calculus seen to the right of the L2-3 disc space which corresponds to ureteropelvic junction calculus noted on prior CT scan.   Electronically Signed By: Marijo Conception M.D. On: 02/17/2020 16:00  No results found for this or any previous visit.  No results found for this or any previous visit.  No results found for this or any previous visit.  No results found for this or any previous visit.  No results found for this or any previous visit.  Results for orders placed during the hospital encounter of 01/19/20  CT HEMATURIA WORKUP  Narrative CLINICAL DATA:  Microscopic hematuria.  EXAM: CT ABDOMEN AND PELVIS WITHOUT AND WITH CONTRAST  TECHNIQUE: Multidetector CT imaging of the abdomen and pelvis was performed following the standard protocol before and following the bolus administration of intravenous contrast.  CONTRAST:  180mL OMNIPAQUE IOHEXOL 300 MG/ML  SOLN  COMPARISON:  None.  FINDINGS: Lower chest: Rounded consolidation at the medial RIGHT lung base with bronchiectasis along the pleural surfaces most suggestive rounded atelectasis. Mild bronchiectasis at the LEFT lung base. No suspicious nodularity.  Hepatobiliary: No focal hepatic lesion. No biliary duct dilatation. Common bile duct is normal.  Pancreas: Pancreas is normal. No ductal dilatation. No pancreatic inflammation.  Spleen: Normal spleen  Adrenals/urinary tract: Adrenal glands normal.  Minimally obstructing calculus in the proximal RIGHT ureter measures 6 mm at the RIGHT ureteropelvic junction. Calculus seen on image 48/2 and image 50/5/coronal. Mild pelvicaliectasis in the RIGHT kidney. Punctate bilateral renal calcifications (1 per kidney).  Stomach/Bowel:  Stomach, small bowel, appendix, and cecum are normal. The colon and rectosigmoid colon are normal.  Vascular/Lymphatic: Abdominal aorta is normal caliber with atherosclerotic calcification. There is no retroperitoneal or periportal lymphadenopathy. No pelvic lymphadenopathy.  Reproductive: Prostate normal.  Other: No free fluid.  Musculoskeletal: No aggressive osseous lesion.  IMPRESSION: 1. Mildly obstructing calculus at the RIGHT ureteropelvic junction. 2. Bilateral punctate nephrolithiasis. 3. No bladder calculi. 4. Consolidation at the medial RIGHT lung base is favored rounded atelectasis. As no remote comparison available, consider follow-up CT in 3 months to demonstrate size stability.   Electronically Signed By: Suzy Bouchard M.D. On: 01/20/2020 10:41  No results found for this or any previous visit.   Assessment & Plan:    1. Ureteral stone -We discussed the management of kidney stones. These options include observation, ureteroscopy, shockwave lithotripsy (ESWL) and percutaneous nephrolithotomy (PCNL). We discussed which options are relevant to the patient's stone(s). We discussed the natural history of kidney stones as well as the complications of untreated stones and the impact on quality of life without treatment as well as with each of the above listed treatments. We also discussed  the efficacy of each treatment in its ability to clear the stone burden. With any of these management options I discussed the signs and symptoms of infection and the need for emergent treatment should these be experienced. For each option we discussed the ability of each procedure to clear the patient of their stone burden.   For observation I described the risks which include but are not limited to silent renal damage, life-threatening infection, need for emergent surgery, failure to pass stone and pain.   For ureteroscopy I described the risks which include bleeding, infection, damage  to contiguous structures, positioning injury, ureteral stricture, ureteral avulsion, ureteral injury, need for prolonged ureteral stent, inability to perform ureteroscopy, need for an interval procedure, inability to clear stone burden, stent discomfort/pain, heart attack, stroke, pulmonary embolus and the inherent risks with general anesthesia.   For shockwave lithotripsy I described the risks which include arrhythmia, kidney contusion, kidney hemorrhage, need for transfusion, pain, inability to adequately break up stone, inability to pass stone fragments, Steinstrasse, infection associated with obstructing stones, need for alternate surgical procedure, need for repeat shockwave lithotripsy, MI, CVA, PE and the inherent risks with anesthesia/conscious sedation.   For PCNL I described the risks including positioning injury, pneumothorax, hydrothorax, need for chest tube, inability to clear stone burden, renal laceration, arterial venous fistula or malformation, need for embolization of kidney, loss of kidney or renal function, need for repeat procedure, need for prolonged nephrostomy tube, ureteral avulsion, MI, CVA, PE and the inherent risks of general anesthesia.   - The patient would like to proceed with right ESWL - Urinalysis, Routine w reflex microscopic   No follow-ups on file.  Nicolette Bang, MD  [facility] [place]

## 2020-02-24 NOTE — Progress Notes (Signed)
02/24/2020 11:21 AM   Cody Carr 1946/02/01 [id number]  Referring provider: Monico Blitz, MD [address] [place],  [place] [address]  nephrolithiasis  HPI: [name] is a 74yo here for followup for nephrolithiasis. He was evaluated by Dr. Jeffie Carr for microhematuria and was found to have a 49mm right UPJ calculus. He has very mild intermittent right flank pain. No significant LUTS. No gross hematuria. This is his first stone event.    PMH: Past Medical History:  Diagnosis Date  . Hypertension   . Prostate cancer ([place])   . 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 throat biopsy  2008    Home Medications:  Allergies as of 02/24/2020   No Known Allergies     Medication List       Accurate as of February 24, 2020 11:21 AM. If you have any questions, ask your nurse or doctor.        STOP taking these medications   levofloxacin 750 MG tablet Commonly known as: Levaquin Stopped by: Nicolette Bang, MD     TAKE these medications   aspirin 81 MG chewable tablet Chew by mouth daily.   Biotin 1 MG Caps Take by mouth.   cholecalciferol 25 MCG (1000 UNIT) tablet Commonly known as: VITAMIN D3 Take 1,000 Units by mouth daily.   lisinopril 2.5 MG tablet Commonly known as: ZESTRIL Take 2.5 mg by mouth daily. What changed: Another medication with the same name was removed. Continue taking this medication, and follow the directions you see here. Changed by: Nicolette Bang, MD   multivitamin capsule Take 1 capsule by mouth daily.   vitamin B-12 100 MCG tablet Commonly known as: CYANOCOBALAMIN Take 100 mcg by mouth daily.       Allergies: No Known Allergies  Family History: No family history on file.  Social History:  reports that he quit smoking about 41 years ago. His smoking use included cigarettes. He has a 6.00 pack-year smoking history. He has never used  smokeless tobacco. He reports current alcohol use. No history on file for drug use.  ROS: All other review of systems were reviewed and are negative except what is noted above in HPI  Physical Exam: BP (!) 177/98   Pulse 88   Temp 98 F (36.7 C)   Ht 6' (1.829 m)   Wt 214 lb (97.1 kg)   BMI 29.02 kg/m   Constitutional:  Alert and oriented, No acute distress. HEENT: [place] AT, moist mucus membranes.  Trachea midline, no masses. Cardiovascular: No clubbing, cyanosis, or edema. Respiratory: Normal respiratory effort, no increased work of breathing. GI: Abdomen is soft, nontender, nondistended, no abdominal masses GU: No CVA tenderness.  Lymph: No cervical or inguinal lymphadenopathy. Skin: No rashes, bruises or suspicious lesions. Neurologic: Grossly intact, no focal deficits, moving all 4 extremities. Psychiatric: Normal mood and affect.  Laboratory Data: No results found for: WBC, HGB, HCT, MCV, PLT  Lab Results  Component Value Date   CREATININE 1.00 01/19/2020    Lab Results  Component Value Date   PSA 5.9 (H) 11/25/2019   PSA 4.4 (H) 06/17/2019    No results found for: TESTOSTERONE  No results found for: HGBA1C  Urinalysis    Component Value Date/Time   APPEARANCEUR Clear 12/23/2019 1555   GLUCOSEU Negative 12/23/2019 1555   BILIRUBINUR Negative 12/23/2019 1555   PROTEINUR Negative 12/23/2019 1555  NITRITE Negative 12/23/2019 1555   LEUKOCYTESUR Negative 12/23/2019 1555    Lab Results  Component Value Date   LABMICR See below: 12/23/2019   WBCUA None seen 12/23/2019   LABEPIT None seen 12/23/2019   MUCUS Present 12/23/2019   BACTERIA None seen 12/23/2019    Pertinent Imaging: KUB 02/15/2020: Images reviewed and discussed with the patient Results for orders placed during the hospital encounter of 02/15/20  Abdomen 1 view (KUB)  Narrative CLINICAL DATA:  Right kidney stone.  Right flank pain.  EXAM: ABDOMEN - 1 VIEW  COMPARISON:  January 23, 2020.  January 19, 2020.  FINDINGS: The bowel gas pattern is normal. Small calculus is seen to the right of the L2-3 disc space which corresponds to ureteropelvic junction calculus noted on prior CT scan.  IMPRESSION: Small calculus seen to the right of the L2-3 disc space which corresponds to ureteropelvic junction calculus noted on prior CT scan.   Electronically Signed By: Marijo Conception M.D. On: 02/17/2020 16:00  No results found for this or any previous visit.  No results found for this or any previous visit.  No results found for this or any previous visit.  No results found for this or any previous visit.  No results found for this or any previous visit.  Results for orders placed during the hospital encounter of 01/19/20  CT HEMATURIA WORKUP  Narrative CLINICAL DATA:  Microscopic hematuria.  EXAM: CT ABDOMEN AND PELVIS WITHOUT AND WITH CONTRAST  TECHNIQUE: Multidetector CT imaging of the abdomen and pelvis was performed following the standard protocol before and following the bolus administration of intravenous contrast.  CONTRAST:  182mL OMNIPAQUE IOHEXOL 300 MG/ML  SOLN  COMPARISON:  None.  FINDINGS: Lower chest: Rounded consolidation at the medial RIGHT lung base with bronchiectasis along the pleural surfaces most suggestive rounded atelectasis. Mild bronchiectasis at the LEFT lung base. No suspicious nodularity.  Hepatobiliary: No focal hepatic lesion. No biliary duct dilatation. Common bile duct is normal.  Pancreas: Pancreas is normal. No ductal dilatation. No pancreatic inflammation.  Spleen: Normal spleen  Adrenals/urinary tract: Adrenal glands normal.  Minimally obstructing calculus in the proximal RIGHT ureter measures 6 mm at the RIGHT ureteropelvic junction. Calculus seen on image 48/2 and image 50/5/coronal. Mild pelvicaliectasis in the RIGHT kidney. Punctate bilateral renal calcifications (1 per kidney).  Stomach/Bowel:  Stomach, small bowel, appendix, and cecum are normal. The colon and rectosigmoid colon are normal.  Vascular/Lymphatic: Abdominal aorta is normal caliber with atherosclerotic calcification. There is no retroperitoneal or periportal lymphadenopathy. No pelvic lymphadenopathy.  Reproductive: Prostate normal.  Other: No free fluid.  Musculoskeletal: No aggressive osseous lesion.  IMPRESSION: 1. Mildly obstructing calculus at the RIGHT ureteropelvic junction. 2. Bilateral punctate nephrolithiasis. 3. No bladder calculi. 4. Consolidation at the medial RIGHT lung base is favored rounded atelectasis. As no remote comparison available, consider follow-up CT in 3 months to demonstrate size stability.   Electronically Signed By: Suzy Bouchard M.D. On: 01/20/2020 10:41  No results found for this or any previous visit.   Assessment & Plan:    1. Ureteral stone -We discussed the management of kidney stones. These options include observation, ureteroscopy, shockwave lithotripsy (ESWL) and percutaneous nephrolithotomy (PCNL). We discussed which options are relevant to the patient's stone(s). We discussed the natural history of kidney stones as well as the complications of untreated stones and the impact on quality of life without treatment as well as with each of the above listed treatments. We also discussed  the efficacy of each treatment in its ability to clear the stone burden. With any of these management options I discussed the signs and symptoms of infection and the need for emergent treatment should these be experienced. For each option we discussed the ability of each procedure to clear the patient of their stone burden.   For observation I described the risks which include but are not limited to silent renal damage, life-threatening infection, need for emergent surgery, failure to pass stone and pain.   For ureteroscopy I described the risks which include bleeding, infection, damage  to contiguous structures, positioning injury, ureteral stricture, ureteral avulsion, ureteral injury, need for prolonged ureteral stent, inability to perform ureteroscopy, need for an interval procedure, inability to clear stone burden, stent discomfort/pain, heart attack, stroke, pulmonary embolus and the inherent risks with general anesthesia.   For shockwave lithotripsy I described the risks which include arrhythmia, kidney contusion, kidney hemorrhage, need for transfusion, pain, inability to adequately break up stone, inability to pass stone fragments, Steinstrasse, infection associated with obstructing stones, need for alternate surgical procedure, need for repeat shockwave lithotripsy, MI, CVA, PE and the inherent risks with anesthesia/conscious sedation.   For PCNL I described the risks including positioning injury, pneumothorax, hydrothorax, need for chest tube, inability to clear stone burden, renal laceration, arterial venous fistula or malformation, need for embolization of kidney, loss of kidney or renal function, need for repeat procedure, need for prolonged nephrostomy tube, ureteral avulsion, MI, CVA, PE and the inherent risks of general anesthesia.   - The patient would like to proceed with right ESWL - Urinalysis, Routine w reflex microscopic   No follow-ups on file.  Nicolette Bang, MD  [facility] [place]

## 2020-02-27 ENCOUNTER — Other Ambulatory Visit: Payer: Self-pay

## 2020-02-27 DIAGNOSIS — N201 Calculus of ureter: Secondary | ICD-10-CM

## 2020-02-27 LAB — URINALYSIS, ROUTINE W REFLEX MICROSCOPIC
Bilirubin, UA: NEGATIVE
Glucose, UA: NEGATIVE
Ketones, UA: NEGATIVE
Leukocytes,UA: NEGATIVE
Nitrite, UA: NEGATIVE
Protein,UA: NEGATIVE
Specific Gravity, UA: 1.02 (ref 1.005–1.030)
Urobilinogen, Ur: 0.2 mg/dL (ref 0.2–1.0)
pH, UA: 6 (ref 5.0–7.5)

## 2020-02-27 LAB — MICROSCOPIC EXAMINATION
Renal Epithel, UA: NONE SEEN /hpf
WBC, UA: NONE SEEN /hpf (ref 0–5)

## 2020-02-29 ENCOUNTER — Encounter (HOSPITAL_COMMUNITY)
Admission: RE | Admit: 2020-02-29 | Discharge: 2020-02-29 | Disposition: A | Discharge status: Home / Self Care | Payer: Medicare PPO | Source: Ambulatory Visit | Attending: Urology | Admitting: Urology

## 2020-02-29 ENCOUNTER — Other Ambulatory Visit: Payer: Self-pay

## 2020-03-05 ENCOUNTER — Encounter (HOSPITAL_COMMUNITY): Payer: Self-pay | Admitting: Urology

## 2020-03-05 ENCOUNTER — Other Ambulatory Visit (HOSPITAL_COMMUNITY)
Admission: RE | Admit: 2020-03-05 | Discharge: 2020-03-05 | Disposition: A | Discharge status: Home / Self Care | Payer: Medicare PPO | Source: Ambulatory Visit | Attending: Urology | Admitting: Urology

## 2020-03-05 ENCOUNTER — Other Ambulatory Visit: Payer: Self-pay

## 2020-03-05 DIAGNOSIS — Z01812 Encounter for preprocedural laboratory examination: Secondary | ICD-10-CM | POA: Diagnosis present

## 2020-03-05 DIAGNOSIS — Z20822 Contact with and (suspected) exposure to covid-19: Secondary | ICD-10-CM | POA: Insufficient documentation

## 2020-03-05 LAB — SARS CORONAVIRUS 2 (TAT 6-24 HRS): SARS Coronavirus 2: NEGATIVE

## 2020-03-06 ENCOUNTER — Encounter (HOSPITAL_COMMUNITY): Payer: Self-pay | Admitting: Urology

## 2020-03-06 ENCOUNTER — Other Ambulatory Visit: Payer: Self-pay

## 2020-03-06 ENCOUNTER — Encounter (HOSPITAL_COMMUNITY): Payer: Self-pay

## 2020-03-06 ENCOUNTER — Ambulatory Visit (HOSPITAL_COMMUNITY)
Admission: RE | Admit: 2020-03-06 | Discharge: 2020-03-06 | Disposition: A | Discharge status: Home / Self Care | Payer: Medicare PPO | Source: Home / Self Care | Attending: Urology | Admitting: Urology

## 2020-03-06 ENCOUNTER — Ambulatory Visit (HOSPITAL_COMMUNITY)
Admission: RE | Admit: 2020-03-06 | Discharge: 2020-03-06 | Disposition: A | Discharge status: Home / Self Care | Payer: Medicare PPO | Attending: Urology | Admitting: Urology

## 2020-03-06 ENCOUNTER — Encounter (HOSPITAL_COMMUNITY)
Admission: RE | Disposition: A | Discharge status: Home / Self Care | Payer: Self-pay | Source: Home / Self Care | Attending: Urology

## 2020-03-06 DIAGNOSIS — N201 Calculus of ureter: Secondary | ICD-10-CM

## 2020-03-06 DIAGNOSIS — N200 Calculus of kidney: Secondary | ICD-10-CM | POA: Diagnosis not present

## 2020-03-06 DIAGNOSIS — Z85818 Personal history of malignant neoplasm of other sites of lip, oral cavity, and pharynx: Secondary | ICD-10-CM | POA: Insufficient documentation

## 2020-03-06 DIAGNOSIS — Z87891 Personal history of nicotine dependence: Secondary | ICD-10-CM | POA: Diagnosis not present

## 2020-03-06 DIAGNOSIS — I10 Essential (primary) hypertension: Secondary | ICD-10-CM | POA: Insufficient documentation

## 2020-03-06 DIAGNOSIS — Z8546 Personal history of malignant neoplasm of prostate: Secondary | ICD-10-CM | POA: Insufficient documentation

## 2020-03-06 DIAGNOSIS — Z923 Personal history of irradiation: Secondary | ICD-10-CM | POA: Insufficient documentation

## 2020-03-06 DIAGNOSIS — Z9221 Personal history of antineoplastic chemotherapy: Secondary | ICD-10-CM | POA: Insufficient documentation

## 2020-03-06 HISTORY — PX: EXTRACORPOREAL SHOCK WAVE LITHOTRIPSY: SHX1557

## 2020-03-06 SURGERY — LITHOTRIPSY, ESWL
Anesthesia: LOCAL | Laterality: Right

## 2020-03-06 MED ORDER — OXYCODONE-ACETAMINOPHEN 5-325 MG PO TABS: 1.0000 | ORAL_TABLET | ORAL | 0 refills | Status: DC | PRN

## 2020-03-06 MED ORDER — ONDANSETRON HCL 4 MG PO TABS: 4.0000 mg | ORAL_TABLET | Freq: Every day | ORAL | 1 refills | Status: DC | PRN

## 2020-03-06 MED ORDER — DIPHENHYDRAMINE HCL 25 MG PO CAPS
25.0000 mg | ORAL_CAPSULE | ORAL | Status: AC
  Administered 2020-03-06: 25 mg via ORAL
  Filled 2020-03-06: qty 1

## 2020-03-06 MED ORDER — TAMSULOSIN HCL 0.4 MG PO CAPS: 0.4000 mg | ORAL_CAPSULE | Freq: Every day | ORAL | 0 refills | Status: DC

## 2020-03-06 NOTE — Discharge Instructions (Signed)
Lithotripsy  Lithotripsy is a treatment that can sometimes help eliminate kidney stones and the pain that they cause. A form of lithotripsy, also known as extracorporeal shock wave lithotripsy, is a nonsurgical procedure that crushes a kidney stone with shock waves. These shock waves pass through your body and focus on the kidney stone. They cause the kidney stone to break up while it is still in the urinary tract. This makes it easier for the smaller pieces of stone to pass in the urine. Tell a health care provider about:  Any allergies you have.  All medicines you are taking, including vitamins, herbs, eye drops, creams, and over-the-counter medicines.  Any blood disorders you have.  Any surgeries you have had.  Any medical conditions you have.  Whether you are pregnant or may be pregnant.  Any problems you or family members have had with anesthetic medicines. What are the risks? Generally, this is a safe procedure. However, problems may occur, including:  Infection.  Bleeding of the kidney.  Bruising of the kidney or skin.  Scarring of the kidney, which can lead to: ? Increased blood pressure. ? Poor kidney function. ? Return (recurrence) of kidney stones.  Damage to other structures or organs, such as the liver, colon, spleen, or pancreas.  Blockage (obstruction) of the the tube that carries urine from the kidney to the bladder (ureter).  Failure of the kidney stone to break into pieces (fragments). What happens before the procedure? Staying hydrated Follow instructions from your health care provider about hydration, which may include:  Up to 2 hours before the procedure - you may continue to drink clear liquids, such as water, clear fruit juice, black coffee, and plain tea. Eating and drinking restrictions Follow instructions from your health care provider about eating and drinking, which may include:  8 hours before the procedure - stop eating heavy meals or foods  such as meat, fried foods, or fatty foods.  6 hours before the procedure - stop eating light meals or foods, such as toast or cereal.  6 hours before the procedure - stop drinking milk or drinks that contain milk.  2 hours before the procedure - stop drinking clear liquids. General instructions  Plan to have someone take you home from the hospital or clinic.  Ask your health care provider about: ? Changing or stopping your regular medicines. This is especially important if you are taking diabetes medicines or blood thinners. ? Taking medicines such as aspirin and ibuprofen. These medicines and other NSAIDs can thin your blood. Do not take these medicines for 7 days before your procedure if your health care provider instructs you not to.  You may have tests, such as: ? Blood tests. ? Urine tests. ? Imaging tests, such as a CT scan. What happens during the procedure?  To lower your risk of infection: ? Your health care team will wash or sanitize their hands. ? Your skin will be washed with soap.  An IV tube will be inserted into one of your veins. This tube will give you fluids and medicines.  You will be given one or more of the following: ? A medicine to help you relax (sedative). ? A medicine to make you fall asleep (general anesthetic).  A water-filled cushion may be placed behind your kidney or on your abdomen. In some cases you may be placed in a tub of lukewarm water.  Your body will be positioned in a way that makes it easy to target the kidney  stone.  A flexible tube with holes in it (stent) may be placed in the ureter. This will help keep urine flowing from the kidney if the fragments of the stone have been blocking the ureter.  An X-ray or ultrasound exam will be done to locate your stone.  Shock waves will be aimed at the stone. If you are awake, you may feel a tapping sensation as the shock waves pass through your body. The procedure may vary among health care  providers and hospitals. What happens after the procedure?  You may have an X-ray to see whether the procedure was able to break up the kidney stone and how much of the stone has passed. If large stone fragments remain after treatment, you may need to have a second procedure at a later time.  Your blood pressure, heart rate, breathing rate, and blood oxygen level will be monitored until the medicines you were given have worn off.  You may be given antibiotics or pain medicine as needed.  If a stent was placed in your ureter during surgery, it may stay in place for a few weeks.  You may need strain your urine to collect pieces of the kidney stone for testing.  You will need to drink plenty of water.  Do not drive for 24 hours if you were given a sedative. Summary  Lithotripsy is a treatment that can sometimes help eliminate kidney stones and the pain that they cause.  A form of lithotripsy, also known as extracorporeal shock wave lithotripsy, is a nonsurgical procedure that crushes a kidney stone with shock waves.  Generally, this is a safe procedure. However, problems may occur, including damage to the kidney or other organs, infection, or obstruction of the tube that carries urine from the kidney to the bladder (ureter).  When you go home, you will need to drink plenty of water. You may be asked to strain your urine to collect pieces of the kidney stone for testing. This information is not intended to replace advice given to you by your health care provider. Make sure you discuss any questions you have with your health care provider. Document Revised: 08/02/2018 Document Reviewed: 03/12/2016 Elsevier Patient Education  2020 [employer].

## 2020-03-06 NOTE — Interval H&P Note (Signed)
History and Physical Interval Note:  03/06/2020 12:11 PM  Cody Carr  has presented today for surgery, with the diagnosis of right ureteral calculus.  The various methods of treatment have been discussed with the patient and family. After consideration of risks, benefits and other options for treatment, the patient has consented to  Procedure(s): EXTRACORPOREAL SHOCK WAVE LITHOTRIPSY (ESWL) (Right) as a surgical intervention.  The patient's history has been reviewed, patient examined, no change in status, stable for surgery.  I have reviewed the patient's chart and labs.  Questions were answered to the patient's satisfaction.     Nicolette Bang

## 2020-03-07 ENCOUNTER — Encounter (HOSPITAL_COMMUNITY): Payer: Self-pay | Admitting: Urology

## 2020-03-20 ENCOUNTER — Encounter: Payer: Self-pay | Admitting: Urology

## 2020-03-20 ENCOUNTER — Ambulatory Visit (HOSPITAL_COMMUNITY)
Admission: RE | Admit: 2020-03-20 | Discharge: 2020-03-20 | Disposition: A | Discharge status: Home / Self Care | Payer: Medicare PPO | Source: Ambulatory Visit | Attending: Urology | Admitting: Urology

## 2020-03-20 ENCOUNTER — Other Ambulatory Visit: Payer: Self-pay

## 2020-03-20 ENCOUNTER — Ambulatory Visit (INDEPENDENT_AMBULATORY_CARE_PROVIDER_SITE_OTHER): Payer: Medicare PPO | Admitting: Urology

## 2020-03-20 VITALS — BP 137/88 | HR 97 | Temp 98.3°F | Ht 73.0 in | Wt 215.0 lb

## 2020-03-20 DIAGNOSIS — N201 Calculus of ureter: Secondary | ICD-10-CM | POA: Insufficient documentation

## 2020-03-20 DIAGNOSIS — C61 Malignant neoplasm of prostate: Secondary | ICD-10-CM

## 2020-03-20 LAB — URINALYSIS, ROUTINE W REFLEX MICROSCOPIC
Bilirubin, UA: NEGATIVE
Glucose, UA: NEGATIVE
Leukocytes,UA: NEGATIVE
Nitrite, UA: NEGATIVE
Protein,UA: NEGATIVE
RBC, UA: NEGATIVE
Specific Gravity, UA: >1.03 — ABNORMAL HIGH (ref 1.005–1.030)
Urobilinogen, Ur: 1 mg/dL (ref 0.2–1.0)
pH, UA: 5.5 (ref 5.0–7.5)

## 2020-03-20 IMAGING — DX DG ABDOMEN 1V
1 series · 1 of 1 positions shown · non-contrast
Comparison: Prior abdominal radiographs [DATE]. CT
abdomen/pelvis [DATE].

CLINICAL DATA: Ureteral stone. Nephrolithiasis. Additional history
provided: History of lithotripsy 2 weeks ago on right side.

EXAM:
ABDOMEN - 1 VIEW

[abdomen kub]
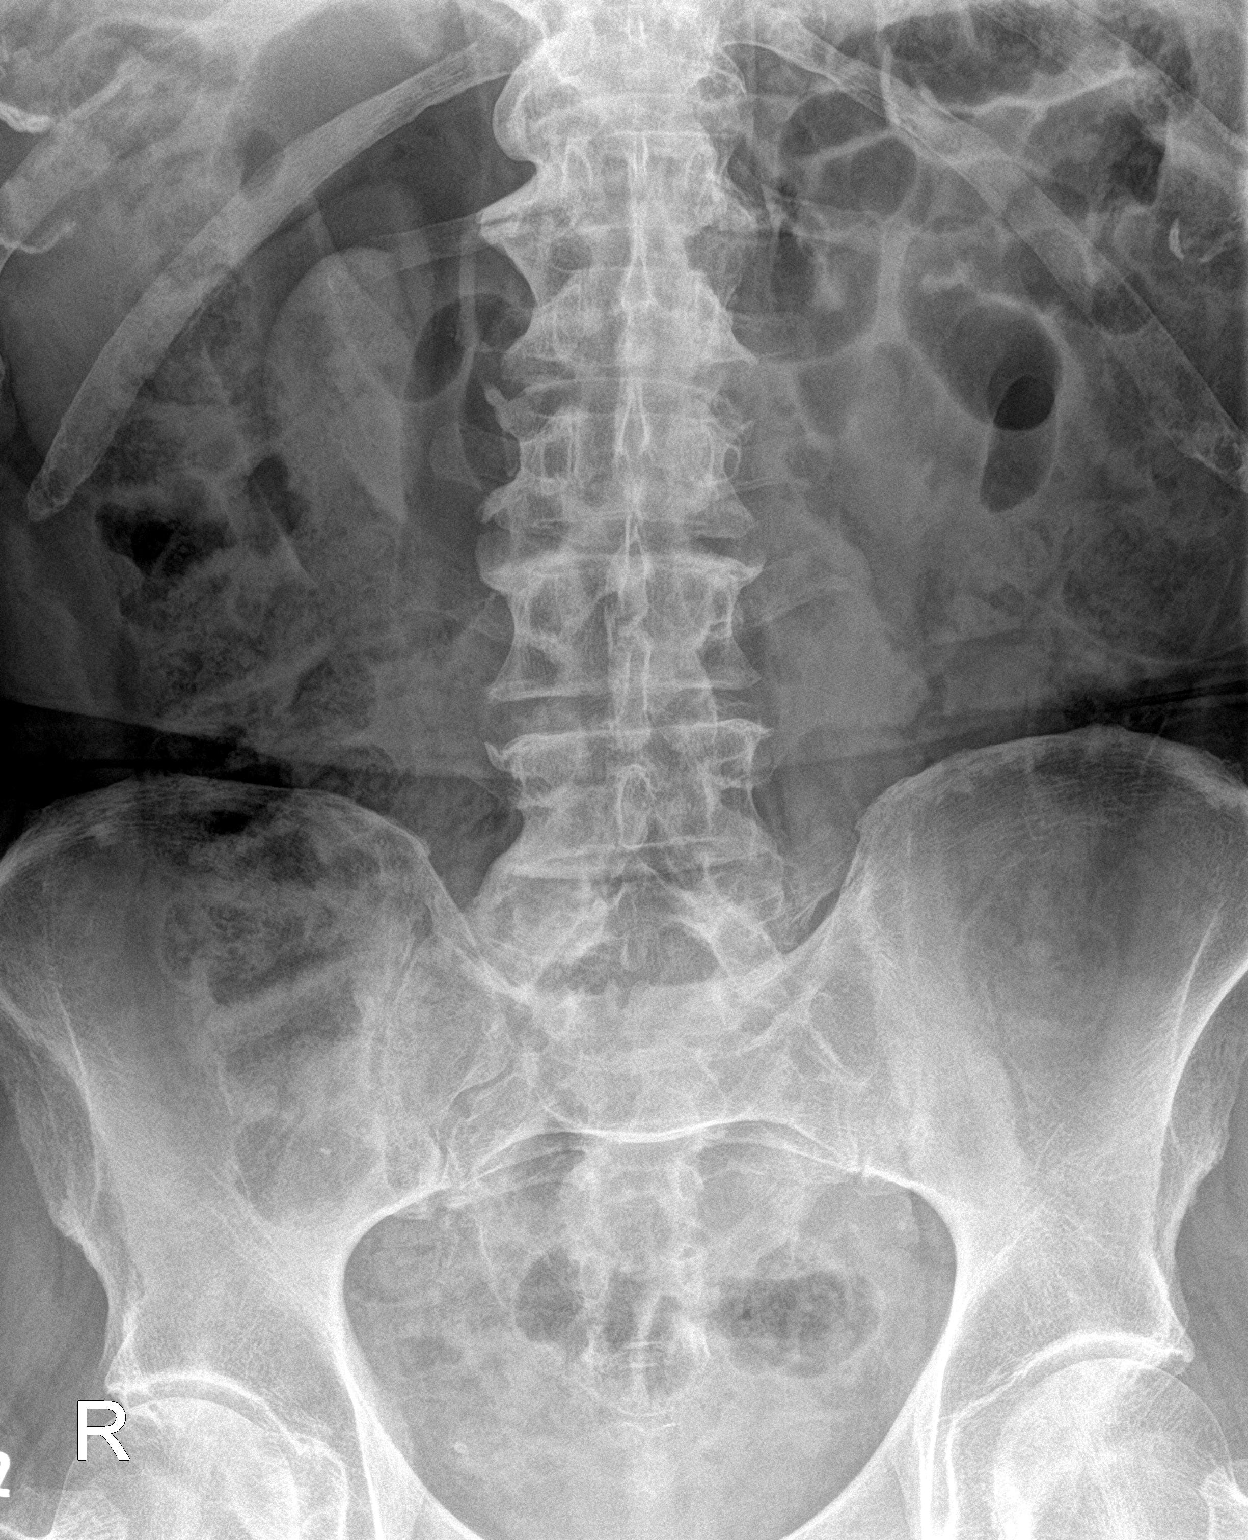

[1 of 1 positions shown; findings below may reference images not displayed]

FINDINGS: Nonobstructive bowel gas pattern. The previously demonstrated right
ureteropelvic junction calculus is no longer appreciated. No renal
calculus is no acute bony abnormality.
IMPRESSION: The right ureteropelvic junction calculus demonstrated on the prior
examination of [DATE] is no longer appreciated.

## 2020-03-20 NOTE — Progress Notes (Signed)
Urological Symptom Review  Patient is experiencing the following symptoms: Stream starts and stops Weak stream Erection problems (male only)  Kidney stone   Review of Systems  Gastrointestinal (upper)  : Negative for upper GI symptoms  Gastrointestinal (lower) : Negative for lower GI symptoms  Constitutional : Negative for symptoms  Skin: Negative for skin symptoms  Eyes: Negative for eye symptoms  Ear/Nose/Throat : Negative for Ear/Nose/Throat symptoms  Hematologic/Lymphatic: Negative for Hematologic/Lymphatic symptoms  Cardiovascular : Negative for cardiovascular symptoms  Respiratory : Negative for respiratory symptoms  Endocrine: Negative for endocrine symptoms  Musculoskeletal: Negative for musculoskeletal symptoms  Neurological: Negative for neurological symptoms  Psychologic: Negative for psychiatric symptoms

## 2020-03-20 NOTE — Addendum Note (Signed)
Addended by: Valentina Lucks on: 03/20/2020 05:47 PM   Modules accepted: Orders

## 2020-03-20 NOTE — Addendum Note (Signed)
Addended by: Valentina Lucks on: 03/20/2020 04:14 PM   Modules accepted: Orders

## 2020-03-20 NOTE — Progress Notes (Signed)
03/20/2020 2:36 PM   Cody Carr [birth year] [id number]  Referring provider: Monico Blitz, MD [address] [place],  [place] [address]  followup nephrolithiasis  HPI: Cody Carr is a 74yo here for followup for nephrolithiasis. He underwent ESWL 2 weeks ago. He passed multiple fragments. No gross hematuria. No flank pain. He did not get a KUB.    PMH: Past Medical History:  Diagnosis Date  . Hypertension   . Prostate cancer ([place])   . 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 EXTRACORPOREAL SHOCK WAVE LITHOTRIPSY Right 03/06/2020   Procedure: EXTRACORPOREAL SHOCK WAVE LITHOTRIPSY (ESWL);  Surgeon: Cleon Gustin, MD;  Location: [facility];  Service: Urology;  Laterality: Right;  . throat biopsy  2008    Home Medications:  Allergies as of 03/20/2020   No Known Allergies     Medication List       Accurate as of March 20, 2020  2:36 PM. If you have any questions, ask your nurse or doctor.        STOP taking these medications   aspirin EC 81 MG tablet Stopped by: Nicolette Bang, MD     TAKE these medications   Biotin 5000 MCG Caps Take 5,000 mcg by mouth daily.   lisinopril 2.5 MG tablet Commonly known as: ZESTRIL Take 2.5 mg by mouth every evening.   multivitamin with minerals Tabs tablet Take 1 tablet by mouth daily.   naproxen sodium 220 MG tablet Commonly known as: ALEVE Take 220-440 mg by mouth daily as needed (pain).   ondansetron 4 MG tablet Commonly known as: Zofran Take 1 tablet (4 mg total) by mouth daily as needed for nausea or vomiting.   oxyCODONE-acetaminophen 5-325 MG tablet Commonly known as: Percocet Take 1 tablet by mouth every 4 (four) hours as needed for moderate pain or severe pain.   tamsulosin 0.4 MG Caps capsule Commonly known as: Flomax Take 1 capsule (0.4 mg total) by mouth daily after supper.   Vitamin B-12 5000 MCG  Subl Take 5,000 mcg by mouth daily.   Vitamin D3 50 MCG (2000 UT) Tabs Take 2,000 Units by mouth daily.       Allergies: No Known Allergies  Family History: No family history on file.  Social History:  reports that he quit smoking about 41 years ago. His smoking use included cigarettes. He has a 6.00 pack-year smoking history. He has never used smokeless tobacco. He reports current alcohol use. He reports that he does not use drugs.  ROS: All other review of systems were reviewed and are negative except what is noted above in HPI  Physical Exam: BP 137/88   Pulse 97   Temp 98.3 F (36.8 C)   Ht 6\' 1"  (1.854 m)   Wt 215 lb (97.5 kg)   BMI 28.37 kg/m   Constitutional:  Alert and oriented, No acute distress. HEENT: [place] AT, moist mucus membranes.  Trachea midline, no masses. Cardiovascular: No clubbing, cyanosis, or edema. Respiratory: Normal respiratory effort, no increased work of breathing. GI: Abdomen is soft, nontender, nondistended, no abdominal masses GU: No CVA tenderness.  Lymph: No cervical or inguinal lymphadenopathy. Skin: No rashes, bruises or suspicious lesions. Neurologic: Grossly intact, no focal deficits, moving all 4 extremities. Psychiatric: Normal mood and affect.  Laboratory Data: No results found for: WBC, HGB, HCT, MCV, PLT  Lab Results  Component Value Date  CREATININE 1.00 01/19/2020    Lab Results  Component Value Date   PSA 5.9 (H) 11/25/2019   PSA 4.4 (H) 06/17/2019    No results found for: TESTOSTERONE  No results found for: HGBA1C  Urinalysis    Component Value Date/Time   APPEARANCEUR Clear 02/24/2020 1325   GLUCOSEU Negative 02/24/2020 1325   BILIRUBINUR Negative 02/24/2020 1325   PROTEINUR Negative 02/24/2020 1325   NITRITE Negative 02/24/2020 1325   LEUKOCYTESUR Negative 02/24/2020 1325    Lab Results  Component Value Date   LABMICR See below: 02/24/2020   WBCUA None seen 02/24/2020   LABEPIT 0-10 02/24/2020    MUCUS Present 12/23/2019   BACTERIA Few 02/24/2020    Pertinent Imaging:  Results for orders placed during the hospital encounter of 02/15/20  Abdomen 1 view (KUB)  Narrative CLINICAL DATA:  Right kidney stone.  Right flank pain.  EXAM: ABDOMEN - 1 VIEW  COMPARISON:  January 23, 2020.  January 19, 2020.  FINDINGS: The bowel gas pattern is normal. Small calculus is seen to the right of the L2-3 disc space which corresponds to ureteropelvic junction calculus noted on prior CT scan.  IMPRESSION: Small calculus seen to the right of the L2-3 disc space which corresponds to ureteropelvic junction calculus noted on prior CT scan.   Electronically Signed By: Marijo Conception M.D. On: 02/17/2020 16:00  No results found for this or any previous visit.  No results found for this or any previous visit.  No results found for this or any previous visit.  No results found for this or any previous visit.  No results found for this or any previous visit.  Results for orders placed during the hospital encounter of 01/19/20  CT HEMATURIA WORKUP  Narrative CLINICAL DATA:  Microscopic hematuria.  EXAM: CT ABDOMEN AND PELVIS WITHOUT AND WITH CONTRAST  TECHNIQUE: Multidetector CT imaging of the abdomen and pelvis was performed following the standard protocol before and following the bolus administration of intravenous contrast.  CONTRAST:  152mL OMNIPAQUE IOHEXOL 300 MG/ML  SOLN  COMPARISON:  None.  FINDINGS: Lower chest: Rounded consolidation at the medial RIGHT lung base with bronchiectasis along the pleural surfaces most suggestive rounded atelectasis. Mild bronchiectasis at the LEFT lung base. No suspicious nodularity.  Hepatobiliary: No focal hepatic lesion. No biliary duct dilatation. Common bile duct is normal.  Pancreas: Pancreas is normal. No ductal dilatation. No pancreatic inflammation.  Spleen: Normal spleen  Adrenals/urinary tract: Adrenal glands  normal.  Minimally obstructing calculus in the proximal RIGHT ureter measures 6 mm at the RIGHT ureteropelvic junction. Calculus seen on image 48/2 and image 50/5/coronal. Mild pelvicaliectasis in the RIGHT kidney. Punctate bilateral renal calcifications (1 per kidney).  Stomach/Bowel: Stomach, small bowel, appendix, and cecum are normal. The colon and rectosigmoid colon are normal.  Vascular/Lymphatic: Abdominal aorta is normal caliber with atherosclerotic calcification. There is no retroperitoneal or periportal lymphadenopathy. No pelvic lymphadenopathy.  Reproductive: Prostate normal.  Other: No free fluid.  Musculoskeletal: No aggressive osseous lesion.  IMPRESSION: 1. Mildly obstructing calculus at the RIGHT ureteropelvic junction. 2. Bilateral punctate nephrolithiasis. 3. No bladder calculi. 4. Consolidation at the medial RIGHT lung base is favored rounded atelectasis. As no remote comparison available, consider follow-up CT in 3 months to demonstrate size stability.   Electronically Signed By: Suzy Bouchard M.D. On: 01/20/2020 10:41  No results found for this or any previous visit.   Assessment & Plan:    1.  Ureteral stone KUB today, will call  with results. If it shows residual calculi I will see him back in 4 weeks with KUB - Urinalysis, Routine w reflex microscopic   No follow-ups on file.  Nicolette Bang, MD  [facility] [place]

## 2020-03-20 NOTE — Patient Instructions (Signed)

## 2020-03-21 ENCOUNTER — Telehealth: Payer: Self-pay | Admitting: Urology

## 2020-03-21 ENCOUNTER — Other Ambulatory Visit: Payer: Self-pay

## 2020-03-21 DIAGNOSIS — N200 Calculus of kidney: Secondary | ICD-10-CM

## 2020-03-21 NOTE — Telephone Encounter (Signed)
Pt called and informed of Dr. Jeffie Pollock reasons of CT f/u regarding Right lower lobe atelectasis

## 2020-03-21 NOTE — Telephone Encounter (Signed)
I called patient to give him CT appt info. He said he did not know why he needed to have one.

## 2020-03-25 LAB — CALCULI, WITH PHOTOGRAPH (CLINICAL LAB)
Calcium Oxalate Dihydrate: 10 %
Calcium Oxalate Monohydrate: 90 %
Weight Calculi: 5 mg

## 2020-04-05 ENCOUNTER — Other Ambulatory Visit: Payer: Self-pay

## 2020-04-05 ENCOUNTER — Ambulatory Visit (HOSPITAL_COMMUNITY)
Admission: RE | Admit: 2020-04-05 | Discharge: 2020-04-05 | Disposition: A | Discharge status: Home / Self Care | Payer: Medicare PPO | Source: Ambulatory Visit | Attending: Urology | Admitting: Urology

## 2020-04-05 DIAGNOSIS — J9811 Atelectasis: Secondary | ICD-10-CM | POA: Diagnosis present

## 2020-04-05 IMAGING — CT CT CHEST W/O CM
2 of 4 series · 15 of 36 positions shown, 18 images · non-contrast
Comparison: No available recent chest imaging. Remote PET-CT
[DATE]. Abdominal CT [DATE].

CLINICAL DATA: Atelectasis. Patient states he had x-ray done last
month that showed collapsing right lung. No shortness breath or
chest pain. History of throat and prostate cancer.

EXAM:
CT CHEST WITHOUT CONTRAST
TECHNIQUE: Multidetector CT imaging of the chest was performed following the
standard protocol without IV contrast.

[Series 2: routine chest without · axial · non-contrast · 0.81mm/px · z∈[+1137,+1379]mm · 12 of 144 slices shown, 15 images]
[im 12/144  mediastinal]
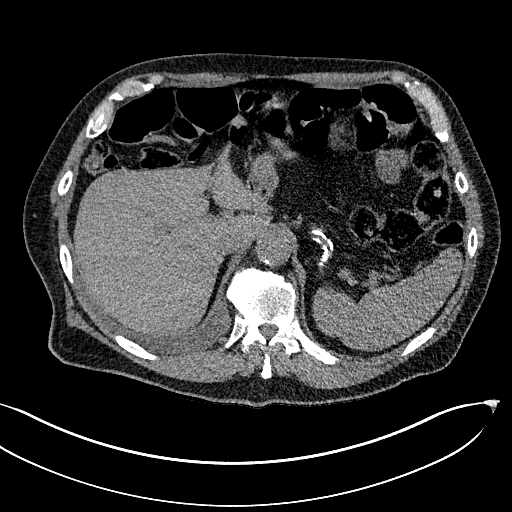
[im 12/144  lung]
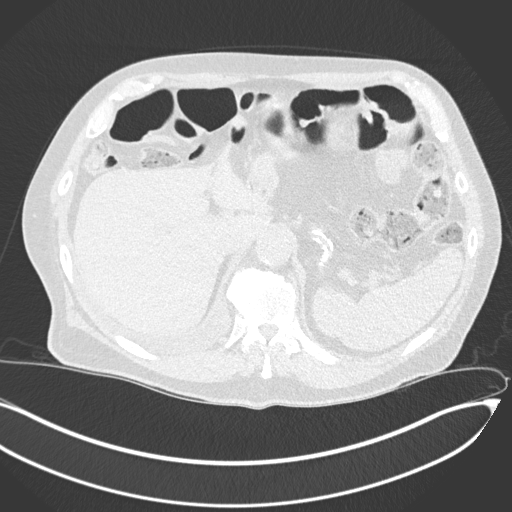
[im 23/144  lung]
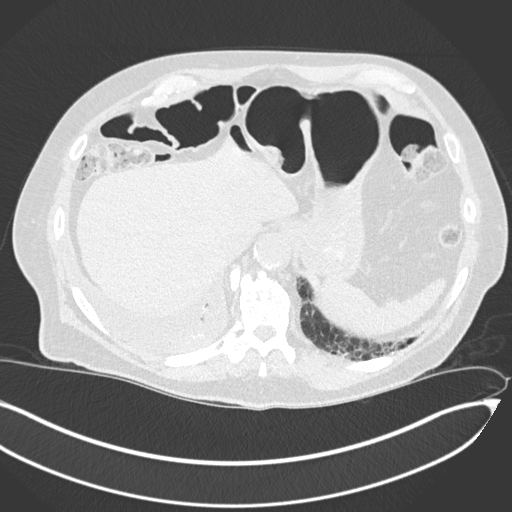
[im 34/144  lung]
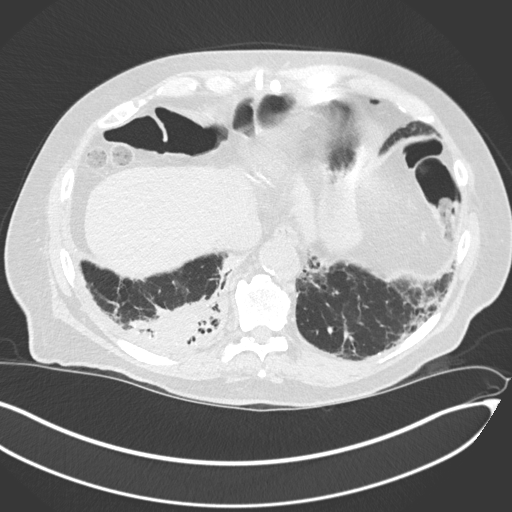
[im 45/144  lung]
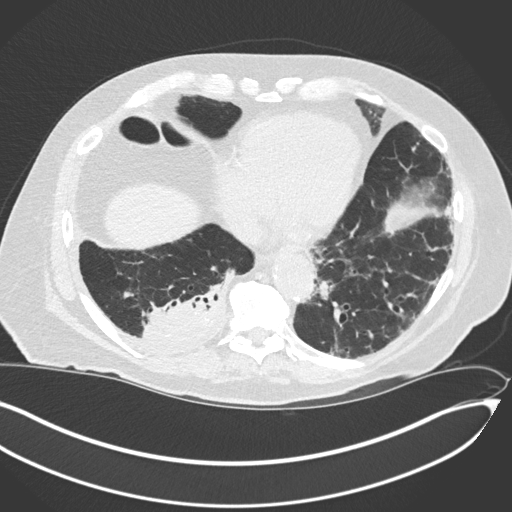
[im 56/144  mediastinal]
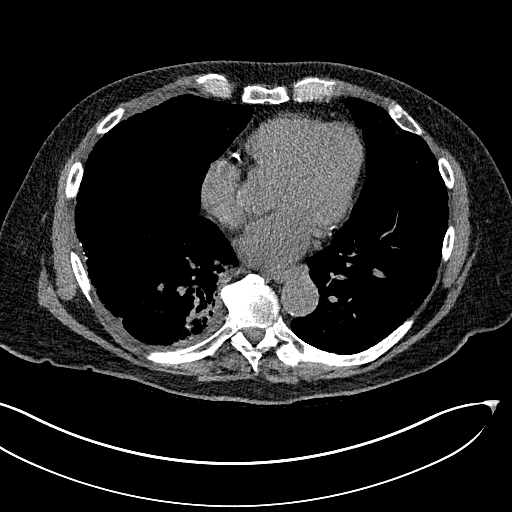
[im 56/144  lung]
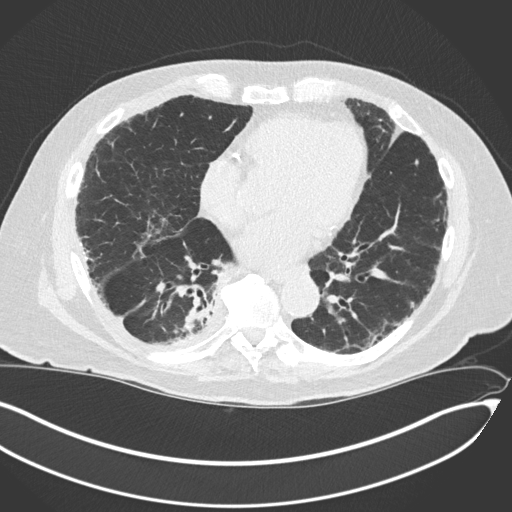
[im 67/144  lung]
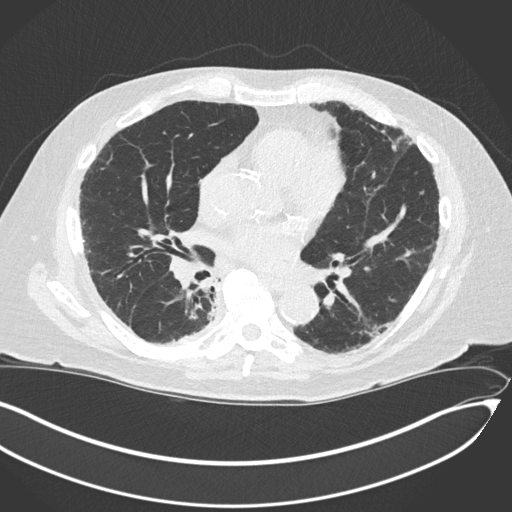
[im 78/144  lung]
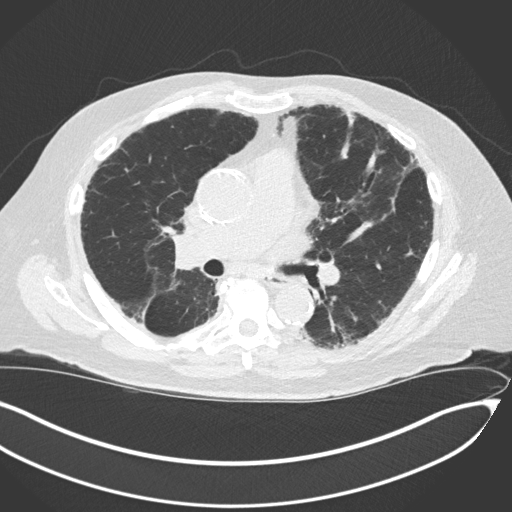
[im 89/144  lung]
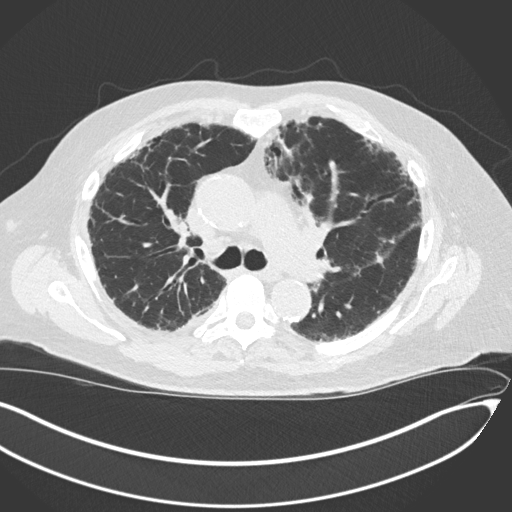
[im 100/144  mediastinal]
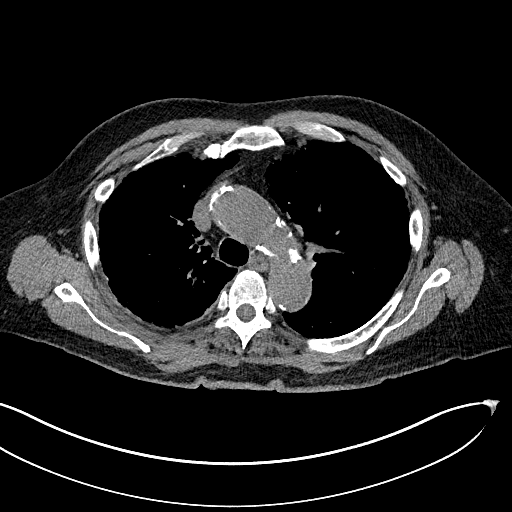
[im 100/144  lung]
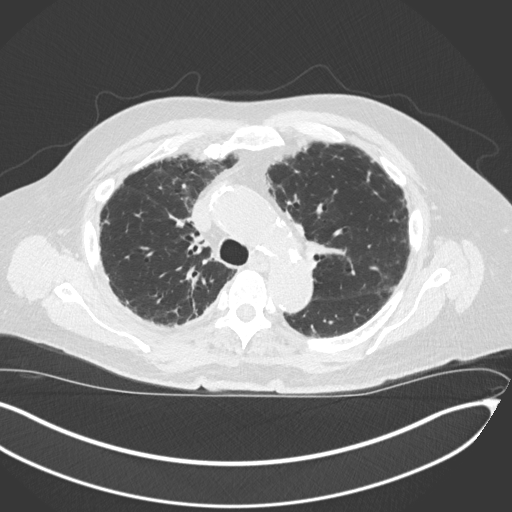
[im 111/144  lung]
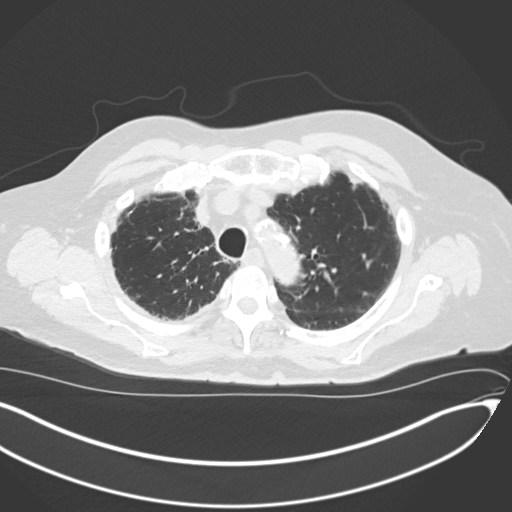
[im 122/144  lung]
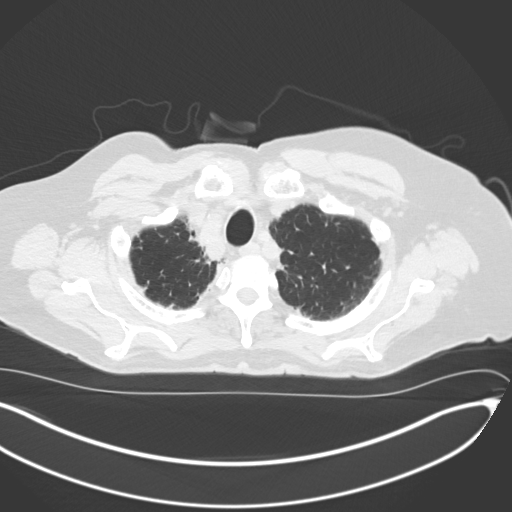
[im 133/144  lung]
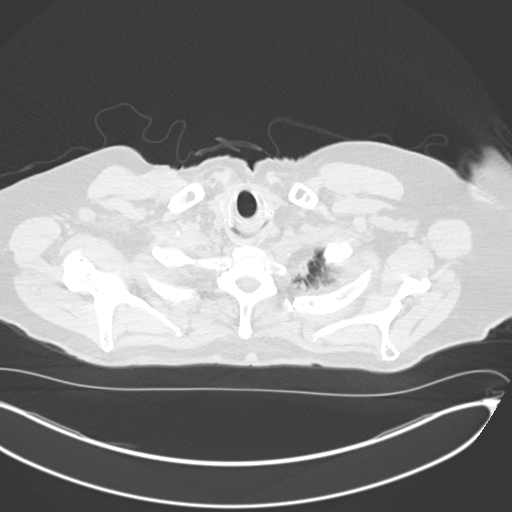

[Series 5: coronal · coronal · 0.67mm/px · 3 of 161 slices shown]
[im 33/161  lung]
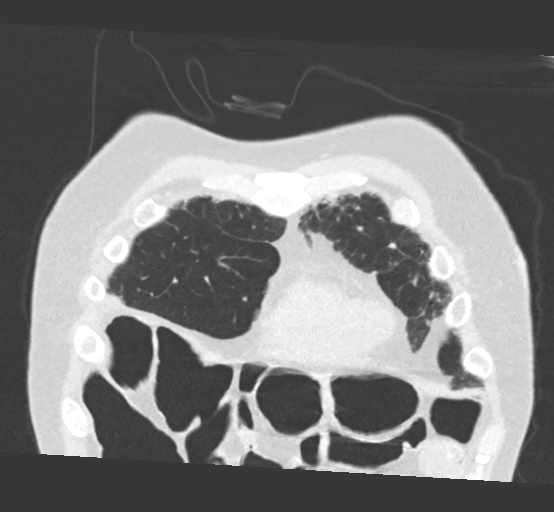
[im 65/161  lung]
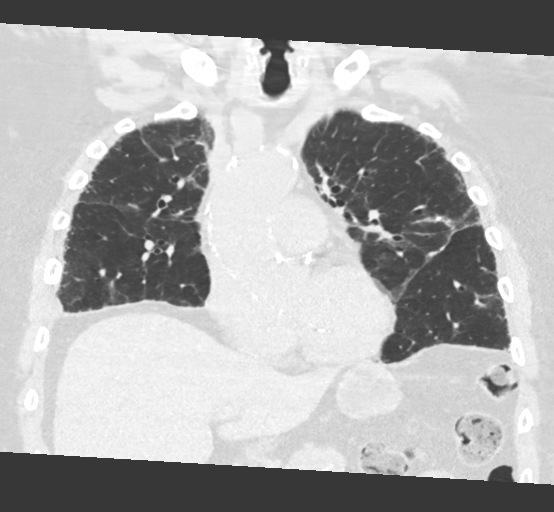
[im 97/161  lung]
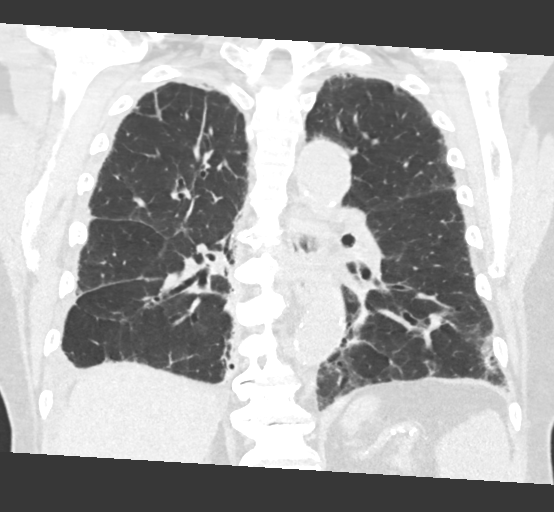

[15 of 36 positions shown; findings below may reference images not displayed]

FINDINGS: Cardiovascular: Extensive three-vessel coronary artery
atherosclerosis with involvement of the aorta and great vessels. No
acute vascular findings on noncontrast imaging. The heart size is
normal. There is no pericardial effusion.

Mediastinum/Nodes: 1.6 cm right infrahilar node on image 76/2
appears slightly larger than on prior abdominal CT. No other
enlarged mediastinal, hilar or axillary lymph nodes identified. The
thyroid gland, trachea and esophagus demonstrate no significant
findings.

Lungs/Pleura: Increased loculated fluid posteriorly at the right
costophrenic angle. There is progressive underlying chronic lung
disease with biapical scarring and scattered subpleural
reticulation. Focal consolidation is again noted dependently in the
right lower lobe with associated volume loss, bronchiectasis and
central calcifications, mildly progressive from recent abdominal CT
and new from remote PET-CT. No other airspace opacities or
suspicious pulmonary nodules.

Upper abdomen: No significant findings within the visualized upper
abdomen.

Musculoskeletal/Chest wall: There is no chest wall mass or
suspicious osseous finding. There are degenerative changes
throughout the spine.
IMPRESSION: 1. Progressive chronic right lower lobe consolidation with
associated volume loss, bronchiectasis and central calcifications
compared with abdominal CT 2.5 months earlier, likely due to chronic
aspiration. The appearance is not suggestive of malignancy.
Recommend radiographic or CT follow-up in 3-6 months.
2. Increased loculated right pleural effusion.
3. Mildly enlarged right infrahilar lymph node, likely reactive.
4. Chronic interstitial lung disease, mildly progressive from remote
PET-CT.
5. Coronary artery and Aortic Atherosclerosis ([J0]-[J0]).

## 2020-04-06 ENCOUNTER — Other Ambulatory Visit: Payer: Medicare PPO

## 2020-04-06 DIAGNOSIS — C61 Malignant neoplasm of prostate: Secondary | ICD-10-CM

## 2020-04-07 LAB — PSA: Prostate Specific Ag, Serum: 7.3 ng/mL — ABNORMAL HIGH (ref 0.0–4.0)

## 2020-04-12 NOTE — Progress Notes (Signed)
04/13/2020 8:08 AM   Cody Carr Mar 18, 1946 [id number]  Referring provider: Monico Blitz, MD [address] [place],  [place] [address]  followup nephrolithiasis and prostate cancer.    HPI: Cody Carr is a 74yo here for followup for nephrolithiasis. He underwent ESWL on 03/06/20. He passed multiple fragments. No gross hematuria. No flank pain. His KUB on 03/20/20 showed no residual stones.  UA is clear.   He had a partially collapsed lung on the CT for the stone and a f/u Chest CT showed similar findings that are felt to be chronic.  I will send that result to his PCP.   He has prostate cancer on surveillance.  His PSA is up to 7.3.  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 His PSA on 8/6 was 5.9 which is up from about 4.5 on 06/17/19 .  He is voiding well without new complaints and he reports his stream is actually improved. He has no bone pain or weight loss.  His IPSS is 5.   He had an MRIP in 8/21 and had a surveillance biopsy and had 4 cores at the left base and mid prostate with Gleason 6 disease with the highest volume 56% at the left base lateral.  His prostate volume was 18ml.   His prior biopsy had 3 cores in the same area.    PMH: Past Medical History:  Diagnosis Date  . Hypertension   . Prostate cancer ([place])   . 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 EXTRACORPOREAL SHOCK WAVE LITHOTRIPSY Right 03/06/2020   Procedure: EXTRACORPOREAL SHOCK WAVE LITHOTRIPSY (ESWL);  Surgeon: Cleon Gustin, MD;  Location: [facility];  Service: Urology;  Laterality: Right;  . throat biopsy  2008    Home Medications:  Allergies as of 04/13/2020   No Known Allergies     Medication List       Accurate as of April 13, 2020 11:59 PM. If you have any questions, ask your nurse or doctor.        STOP taking these  medications   ondansetron 4 MG tablet Commonly known as: Zofran Stopped by: Irine Seal, MD   oxyCODONE-acetaminophen 5-325 MG tablet Commonly known as: Percocet Stopped by: Irine Seal, MD   tamsulosin 0.4 MG Caps capsule Commonly known as: Flomax Stopped by: Irine Seal, MD     TAKE these medications   Biotin 5000 MCG Caps Take 5,000 mcg by mouth daily.   lisinopril 2.5 MG tablet Commonly known as: ZESTRIL Take 2.5 mg by mouth every evening.   multivitamin with minerals Tabs tablet Take 1 tablet by mouth daily.   naproxen sodium 220 MG tablet Commonly known as: ALEVE Take 220-440 mg by mouth daily as needed (pain).   Vitamin B-12 5000 MCG Subl Take 5,000 mcg by mouth daily.   Vitamin D3 50 MCG (2000 UT) Tabs Take 2,000 Units by mouth daily.       Allergies: No Known Allergies  Family History: History reviewed. No pertinent family history.  Social History:  reports that he quit smoking about 41 years ago. His smoking use included cigarettes. He has a 6.00 pack-year smoking history. He has never used smokeless tobacco. He reports current alcohol use. He reports that he does not use drugs.  ROS: All other review of  systems were reviewed and are negative except what is noted above in HPI  Physical Exam: BP (!) 182/79   Pulse 92   Temp 98 F (36.7 C)   Ht 6\' 1"  (1.854 m)   Wt 215 lb (97.5 kg)   BMI 28.37 kg/m    Laboratory Data: No results found for: WBC, HGB, HCT, MCV, PLT  Lab Results  Component Value Date   CREATININE 1.00 01/19/2020    Lab Results  Component Value Date   PSA 5.9 (H) 11/25/2019   PSA 4.4 (H) 06/17/2019   PSA on 04/06/20 was 7.3.  No results found for: TESTOSTERONE  No results found for: HGBA1C  Urinalysis    Component Value Date/Time   APPEARANCEUR Clear 04/13/2020 1544   GLUCOSEU Negative 04/13/2020 1544   BILIRUBINUR Negative 04/13/2020 1544   PROTEINUR Negative 04/13/2020 1544   NITRITE Negative 04/13/2020 1544    LEUKOCYTESUR Negative 04/13/2020 1544    Lab Results  Component Value Date   LABMICR Comment 04/13/2020   WBCUA None seen 02/24/2020   LABEPIT 0-10 02/24/2020   MUCUS Present 12/23/2019   BACTERIA Few 02/24/2020    Pertinent Imaging: CLINICAL DATA:  Ureteral stone. Nephrolithiasis. Additional history provided: History of lithotripsy 2 weeks ago on right side.  EXAM: ABDOMEN - 1 VIEW  COMPARISON:  Prior abdominal radiographs 02/15/2020. CT abdomen/pelvis 01/19/2020.  FINDINGS: Nonobstructive bowel gas pattern. The previously demonstrated right ureteropelvic junction calculus is no longer appreciated. No renal calculus is no acute bony abnormality.  IMPRESSION: The right ureteropelvic junction calculus demonstrated on the prior examination of 02/15/2020 is no longer appreciated.   Electronically Signed   By: Kellie Simmering DO   On: 03/21/2020 14:37  CLINICAL DATA:  Atelectasis. Patient states he had x-ray done last month that showed collapsing right lung. No shortness breath or chest pain. History of throat and prostate cancer.  EXAM: CT CHEST WITHOUT CONTRAST  TECHNIQUE: Multidetector CT imaging of the chest was performed following the standard protocol without IV contrast.  COMPARISON:  No available recent chest imaging. Remote PET-CT 08/10/2006. Abdominal CT 01/19/2020.  FINDINGS: Cardiovascular: Extensive three-vessel coronary artery atherosclerosis with involvement of the aorta and great vessels. No acute vascular findings on noncontrast imaging. The heart size is normal. There is no pericardial effusion.  Mediastinum/Nodes: 1.6 cm right infrahilar node on image 76/2 appears slightly larger than on prior abdominal CT. No other enlarged mediastinal, hilar or axillary lymph nodes identified. The thyroid gland, trachea and esophagus demonstrate no significant findings.  Lungs/Pleura: Increased loculated fluid posteriorly at the  right costophrenic angle. There is progressive underlying chronic lung disease with biapical scarring and scattered subpleural reticulation. Focal consolidation is again noted dependently in the right lower lobe with associated volume loss, bronchiectasis and central calcifications, mildly progressive from recent abdominal CT and new from remote PET-CT. No other airspace opacities or suspicious pulmonary nodules.  Upper abdomen: No significant findings within the visualized upper abdomen.  Musculoskeletal/Chest wall: There is no chest wall mass or suspicious osseous finding. There are degenerative changes throughout the spine.  IMPRESSION: 1. Progressive chronic right lower lobe consolidation with associated volume loss, bronchiectasis and central calcifications compared with abdominal CT 2.5 months earlier, likely due to chronic aspiration. The appearance is not suggestive of malignancy. Recommend radiographic or CT follow-up in 3-6 months. 2. Increased loculated right pleural effusion. 3. Mildly enlarged right infrahilar lymph node, likely reactive. 4. Chronic interstitial lung disease, mildly progressive from remote PET-CT. 5. Coronary artery and Aortic  Atherosclerosis (ICD10-I70.0).   Electronically Signed   By: Richardean Sale M.D.   On: 04/06/2020 11:49  Assessment & Plan:    1.  Ureteral stone He is stone free.   2. Prostate cancer.  His PSA is up some but he is only a few months out from the Cody fusion biopsy, but with the concentrated location of the tumor at this left base I am concerned that this tumor might behave more like an intermediate risk lesion.   I am going to order and Oncotype Dx test and also have him see Dr. Tammi Klippel for a discussion about seeds.   He will otherwise see me in 3 months with a PSA.       Irine Seal, MD  [facility] [place]

## 2020-04-13 ENCOUNTER — Other Ambulatory Visit: Payer: Self-pay

## 2020-04-13 ENCOUNTER — Encounter: Payer: Self-pay | Admitting: Urology

## 2020-04-13 ENCOUNTER — Ambulatory Visit (INDEPENDENT_AMBULATORY_CARE_PROVIDER_SITE_OTHER): Payer: Medicare PPO | Admitting: Urology

## 2020-04-13 VITALS — BP 182/79 | HR 92 | Temp 98.0°F | Ht 73.0 in | Wt 215.0 lb

## 2020-04-13 DIAGNOSIS — R972 Elevated prostate specific antigen [PSA]: Secondary | ICD-10-CM | POA: Diagnosis not present

## 2020-04-13 DIAGNOSIS — C61 Malignant neoplasm of prostate: Secondary | ICD-10-CM | POA: Diagnosis not present

## 2020-04-13 DIAGNOSIS — R918 Other nonspecific abnormal finding of lung field: Secondary | ICD-10-CM | POA: Diagnosis not present

## 2020-04-13 DIAGNOSIS — Z87442 Personal history of urinary calculi: Secondary | ICD-10-CM | POA: Diagnosis not present

## 2020-04-13 LAB — URINALYSIS, ROUTINE W REFLEX MICROSCOPIC
Bilirubin, UA: NEGATIVE
Glucose, UA: NEGATIVE
Leukocytes,UA: NEGATIVE
Nitrite, UA: NEGATIVE
Protein,UA: NEGATIVE
RBC, UA: NEGATIVE
Specific Gravity, UA: >1.03 — ABNORMAL HIGH (ref 1.005–1.030)
Urobilinogen, Ur: 1 mg/dL (ref 0.2–1.0)
pH, UA: 5 (ref 5.0–7.5)

## 2020-04-13 NOTE — Progress Notes (Signed)
Urological Symptom Review  Patient is experiencing the following symptoms: Stream starts and stops Weak stream Erection problems (male only)   Review of Systems  Gastrointestinal (upper)  : Negative for upper GI symptoms  Gastrointestinal (lower) : Negative for lower GI symptoms  Constitutional : Negative for symptoms  Skin: Negative for skin symptoms  Eyes: Negative for eye symptoms  Ear/Nose/Throat : Negative for Ear/Nose/Throat symptoms  Hematologic/Lymphatic: Negative for Hematologic/Lymphatic symptoms  Cardiovascular : Negative for cardiovascular symptoms  Respiratory : Negative for respiratory symptoms  Endocrine: Negative for endocrine symptoms  Musculoskeletal: Negative for musculoskeletal symptoms  Neurological: Negative for neurological symptoms  Psychologic: Negative for psychiatric symptoms

## 2020-04-14 ENCOUNTER — Encounter: Payer: Self-pay | Admitting: Urology

## 2020-05-09 ENCOUNTER — Encounter: Payer: Self-pay | Admitting: Radiation Oncology

## 2020-05-09 NOTE — Progress Notes (Signed)
GU Location of Tumor / Histology: prostatic adenocarcinoma  If Prostate Cancer, Gleason Score is (3 + 3) and PSA was 6 at diagnosis. Prostate volume: 42 mL  Cody Carr was diagnosed with prostate cancer in July 2020 and opted for active surveillance.  Biopsies of prostate (if applicable) revealed:    Past/Anticipated interventions by urology, if any: prostate biopsy, surveillance, rising PSA, referral for consideration of seed implant, oncotype dx ______?  Past/Anticipated interventions by medical oncology, if any: no  Weight changes, if any: denies  Bowel/Bladder complaints, if any: IPSS 6. SHIM 6. Denies dysuria or hematuria. Denies urinary leakage or incontinence. Denies diarrhea or constipation. Denies nausea or vomiting.   Nausea/Vomiting, if any: denies  Pain issues, if any: reports an occasional twinge right lower back near lithotripsy site.  SAFETY ISSUES:  Prior radiation? Yes, to manage H&N carcinoma  Pacemaker/ICD? denies  Possible current pregnancy? no, male patient  Is the patient on methotrexate? denies  Current Complaints / other details:  75 year old male. Married. Former smoker.

## 2020-05-11 ENCOUNTER — Encounter: Payer: Self-pay | Admitting: Medical Oncology

## 2020-05-11 ENCOUNTER — Ambulatory Visit
Admission: RE | Admit: 2020-05-11 | Discharge: 2020-05-11 | Disposition: A | Discharge status: Home / Self Care | Payer: Medicare PPO | Source: Ambulatory Visit | Attending: Radiation Oncology | Admitting: Radiation Oncology

## 2020-05-11 ENCOUNTER — Other Ambulatory Visit: Payer: Self-pay

## 2020-05-11 ENCOUNTER — Encounter: Payer: Self-pay | Admitting: Radiation Oncology

## 2020-05-11 DIAGNOSIS — Z79899 Other long term (current) drug therapy: Secondary | ICD-10-CM | POA: Diagnosis not present

## 2020-05-11 DIAGNOSIS — I10 Essential (primary) hypertension: Secondary | ICD-10-CM | POA: Insufficient documentation

## 2020-05-11 DIAGNOSIS — Z9221 Personal history of antineoplastic chemotherapy: Secondary | ICD-10-CM | POA: Insufficient documentation

## 2020-05-11 DIAGNOSIS — Z923 Personal history of irradiation: Secondary | ICD-10-CM | POA: Insufficient documentation

## 2020-05-11 DIAGNOSIS — C61 Malignant neoplasm of prostate: Secondary | ICD-10-CM

## 2020-05-11 DIAGNOSIS — Z87891 Personal history of nicotine dependence: Secondary | ICD-10-CM | POA: Diagnosis not present

## 2020-05-11 NOTE — Progress Notes (Signed)
Radiation Oncology         [phone] ________________________________  Initial outpatient Consultation  Name: Cody Carr MRN: [id number]  Date: 05/11/2020  DOB: 05-20-1945  JK:DTOI, Weldon Picking, MD  Irine Seal, MD   REFERRING PHYSICIAN: Irine Seal, MD  DIAGNOSIS: 75 y.o. gentleman with Stage T1c adenocarcinoma of the prostate with Gleason score of 3+3, and PSA of 7.3.    ICD-10-CM   1. Prostate cancer ([place])  C61     HISTORY OF PRESENT ILLNESS: Cody Carr is a 75 y.o. male with a diagnosis of prostate cancer. He was initially diagnosed with Gleason 3+3 prostate cancer on 12/03/2018 by Dr. Jeffie Pollock. At that time, he appropriately opted for active surveillance. PSA at diagnosis was 6. His PSA subsequently decreased to 4.4 in 06/2019 but rose again to 5.9 in 11/2019. Digital rectal examination remained benign, showing only a generally firm prostate. He proceeded to prostate MRI on 12/09/2019 showing an 8 mm left lateral mid gland lesion with mild restricted diffusion (PI-RADS 4).  The patient proceeded to MRI fusion biopsy on 02/09/2020.  The prostate volume measured 42.35 cc.  Out of 15 core biopsies, 7 were positive.  The maximum Gleason score was 3+3, and this was seen in all three ROI MRI lesion samples (with PNI), as well as the left base lateral (with PNI), left mid lateral, left base (with PNI), and left mid.  Around the same time, he was also dealing with ureteral stones under the care of Dr. Alyson Ingles. He ultimately underwent ESWL on 03/06/2020. When he returned to Dr. Jeffie Pollock for follow up in 04/2020, a repeat PSA showed further elevation to 7.3. An Oncotype Dx was performed and confirmed low risk disease with a score of 17.  The patient reviewed the biopsy results with his urologist and he has kindly been referred today for discussion of potential radiation treatment options.   PREVIOUS RADIATION THERAPY: Yes 2008: Throat (Dr. Elba Barman)  PAST MEDICAL HISTORY:  Past Medical History:   Diagnosis Date  . Hypertension   . Prostate cancer ([place])   . 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 EXTRACORPOREAL SHOCK WAVE LITHOTRIPSY Right 03/06/2020   Procedure: EXTRACORPOREAL SHOCK WAVE LITHOTRIPSY (ESWL);  Surgeon: Cleon Gustin, MD;  Location: [facility];  Service: Urology;  Laterality: Right;  . throat biopsy  2008    FAMILY HISTORY:  Family History  Problem Relation Age of Onset  . Prostate cancer Neg Hx   . Breast cancer Neg Hx   . Colon cancer Neg Hx   . Pancreatic cancer Neg Hx     SOCIAL HISTORY:  Social History   Socioeconomic History  . Marital status: Married    Spouse name: Not on file  . Number of children: 1  . Years of education: Not on file  . Highest education level: Not on file  Occupational History  . Not on file  Tobacco Use  . Smoking status: Former Smoker    Packs/day: 0.50    Years: 12.00    Pack years: 6.00    Types: Cigarettes    Quit date: 1980    Years since quitting: 42.0  . Smokeless tobacco: Never Used  Vaping Use  . Vaping Use: Never used  Substance and Sexual Activity  . Alcohol use: Yes    Comment: occasional  . Drug use: Never  . Sexual activity:  Not Currently    Comment: erectile dysfunction  Other Topics Concern  . Not on file  Social History Narrative  . Not on file   Social Determinants of Health   Financial Resource Strain: Not on file  Food Insecurity: Not on file  Transportation Needs: Not on file  Physical Activity: Not on file  Stress: Not on file  Social Connections: Not on file  Intimate Partner Violence: Not on file    ALLERGIES: Patient has no known allergies.  MEDICATIONS:  Current Outpatient Medications  Medication Sig Dispense Refill  . Biotin 5000 MCG CAPS Take 5,000 mcg by mouth daily.    . Cholecalciferol (VITAMIN D3) 50 MCG (2000 UT) TABS Take 2,000 Units by mouth daily.    .  Cyanocobalamin (VITAMIN B-12) 5000 MCG SUBL Take 5,000 mcg by mouth daily.    Marland Kitchen lisinopril (ZESTRIL) 2.5 MG tablet Take 2.5 mg by mouth every evening.     . Multiple Vitamin (MULTIVITAMIN WITH MINERALS) TABS tablet Take 1 tablet by mouth daily.    . naproxen sodium (ALEVE) 220 MG tablet Take 220-440 mg by mouth daily as needed (pain).     No current facility-administered medications for this encounter.    REVIEW OF SYSTEMS:  On review of systems, the patient reports that he is doing well overall. He denies any chest pain, shortness of breath, cough, fevers, chills, night sweats, unintended weight changes. He denies any bowel disturbances, and denies abdominal pain, nausea or vomiting. He denies any new musculoskeletal or joint aches or pains. His IPSS was 6, indicating mild urinary symptoms. His SHIM was 6, indicating he has severe erectile dysfunction. A complete review of systems is obtained and is otherwise negative.    PHYSICAL EXAM:  Wt Readings from Last 3 Encounters:  05/11/20 211 lb 12.8 oz (96.1 kg)  04/13/20 215 lb (97.5 kg)  03/20/20 215 lb (97.5 kg)   Temp Readings from Last 3 Encounters:  05/11/20 97.7 F (36.5 C)  04/13/20 98 F (36.7 C)  03/20/20 98.3 F (36.8 C)   BP Readings from Last 3 Encounters:  05/11/20 (!) 157/89  04/13/20 (!) 182/79  03/20/20 137/88   Pulse Readings from Last 3 Encounters:  05/11/20 (!) 109  04/13/20 92  03/20/20 97   Pain Assessment Pain Score: 0-No pain/10  In general this is a well appearing Caucasian male in no acute distress. He's alert and oriented x4 and appropriate throughout the examination. Cardiopulmonary assessment is negative for acute distress, and he exhibits normal effort.     KPS = 100  100 - Normal; no complaints; no evidence of disease. 90   - Able to carry on normal activity; minor signs or symptoms of disease. 80   - Normal activity with effort; some signs or symptoms of disease. 31   - Cares for self; unable  to carry on normal activity or to do active work. 60   - Requires occasional assistance, but is able to care for most of his personal needs. 50   - Requires considerable assistance and frequent medical care. 21   - Disabled; requires special care and assistance. 22   - Severely disabled; hospital admission is indicated although death not imminent. 85   - Very sick; hospital admission necessary; active supportive treatment necessary. 10   - Moribund; fatal processes progressing rapidly. 0     - Dead  Karnofsky DA, Abelmann WH, Craver LS and Burchenal [facility] [phone]) The use of the nitrogen mustards in the  palliative treatment of carcinoma: with particular reference to bronchogenic carcinoma Cancer 1 [phone]  LABORATORY DATA:  No results found for: WBC, HGB, HCT, MCV, PLT Lab Results  Component Value Date   NA 138 11/25/2019   K 4.7 11/25/2019   CL 105 11/25/2019   CO2 26 11/25/2019   No results found for: ALT, AST, GGT, ALKPHOS, BILITOT   RADIOGRAPHY: No results found.    IMPRESSION/PLAN: 1. 75 y.o. gentleman with Stage T1c adenocarcinoma of the prostate with Gleason Score of 3+3, and PSA of 7.3. We discussed the patient's workup and outlined the nature of prostate cancer in this setting. The patient's T stage, Gleason's score, and PSA put him into the low risk group. Accordingly, he is eligible for a variety of potential treatment options including continued active surveillance, brachytherapy, 5.5 weeks of external radiation, or prostatectomy. We discussed the available radiation techniques, and focused on the details and logistics of delivery. We discussed and outlined the risks, benefits, short and long-term effects associated with radiotherapy and compared and contrasted these with prostatectomy. We discussed the role of SpaceOAR gel in reducing the rectal toxicity associated with radiotherapy. He appears to have a good understanding of his disease and our treatment recommendations which are of  curative intent.  He was encouraged to ask questions that were answered to his stated satisfaction.  At the conclusion of our conversation, the patient is interested in moving forward with brachytherapy and use of SpaceOAR gel to reduce rectal toxicity from radiotherapy.  We will share our discussion with Dr. Jeffie Pollock and move forward with scheduling his CT [facility] planning appointment in the near future.  The patient met briefly with Romie Jumper in our office who will be working closely with him to coordinate OR scheduling and pre and post procedure appointments.  We will contact the pharmaceutical rep to ensure that [place] is available at the time of procedure.  We enjoyed meeting him today and look forward to continuing to participate in his care.    Nicholos Johns, PA-C    Tyler Pita, MD  [facility] Direct Dial: [phone]  Fax: [phone] [email or web address]  Skype  [email or web address]   This document serves as a record of services personally performed by Tyler Pita, MD and Freeman Caldron, PA-C. It was created on their behalf by Wilburn Mylar, a trained medical scribe. The creation of this record is based on the scribe's personal observations and the provider's statements to them. This document has been checked and approved by the attending provider.

## 2020-05-11 NOTE — Progress Notes (Signed)
Introduced myself to patient and his wife as the prostate nurse navigator and discussed my role. He has been under active surveillance but not here to discuss his radiation treatment options. I gave him my business card and asked him to call me with questions or concerns. He voiced understanding.

## 2020-05-15 ENCOUNTER — Telehealth: Payer: Self-pay | Admitting: *Deleted

## 2020-05-15 ENCOUNTER — Telehealth: Payer: Self-pay

## 2020-05-15 NOTE — Telephone Encounter (Signed)
Pt called and notified. Pt has seen Dr. Tammi Klippel and wishes to move forward with radiation per pt.

## 2020-05-15 NOTE — Telephone Encounter (Signed)
Called patient to update, spoke with patient

## 2020-05-15 NOTE — Telephone Encounter (Signed)
-----   Message from Irine Seal, MD sent at 05/10/2020  4:50 PM EST ----- Regarding: Oncotype Dx score His Oncotype GPS score is 17 which is below the median score of 25 for his risk group which is consistent with a lower risk of high grade disease.   I would like for him to see Dr. Tammi Klippel as we had discussed and then we could get him back in for follow with me to discuss the options further if he would like.

## 2020-05-16 ENCOUNTER — Other Ambulatory Visit: Payer: Self-pay | Admitting: Urology

## 2020-05-16 DIAGNOSIS — C61 Malignant neoplasm of prostate: Secondary | ICD-10-CM

## 2020-05-16 NOTE — Telephone Encounter (Signed)
Sure will.   Cody Carr

## 2020-05-16 NOTE — Telephone Encounter (Signed)
Cody Carr,  Can you let me know when this patient gets scheduled so I can make his appt for [place].

## 2020-05-16 NOTE — Telephone Encounter (Signed)
Yes I have seen the note and am waiting for Dr. Tammi Klippel to get things scheduled for the seeds.   Cody Carr will need a f/u appointment with [place] for a preop check 1-2 weeks prior to surgery when that is scheduled.

## 2020-05-17 ENCOUNTER — Telehealth: Payer: Self-pay | Admitting: *Deleted

## 2020-05-17 NOTE — Telephone Encounter (Signed)
Called patient to inform of pre-seed appts. and implant date, spoke with patient and he is aware of these appts.

## 2020-05-31 NOTE — Telephone Encounter (Signed)
Pre op is March 10th

## 2020-06-06 ENCOUNTER — Telehealth: Payer: Self-pay | Admitting: *Deleted

## 2020-06-06 NOTE — Telephone Encounter (Signed)
CALLED PATIENT TO REMIND OF PRE-SEED APPTS. FOR 06-07-20, LVM FOR A RETURN CALL

## 2020-06-07 ENCOUNTER — Ambulatory Visit
Admission: RE | Admit: 2020-06-07 | Discharge: 2020-06-07 | Disposition: A | Discharge status: Home / Self Care | Payer: Medicare PPO | Source: Ambulatory Visit | Attending: Urology | Admitting: Urology

## 2020-06-07 ENCOUNTER — Ambulatory Visit
Admission: RE | Admit: 2020-06-07 | Discharge: 2020-06-07 | Disposition: A | Discharge status: Home / Self Care | Payer: Medicare PPO | Source: Ambulatory Visit | Attending: Radiation Oncology | Admitting: Radiation Oncology

## 2020-06-07 ENCOUNTER — Encounter: Payer: Self-pay | Admitting: Medical Oncology

## 2020-06-07 ENCOUNTER — Ambulatory Visit (HOSPITAL_COMMUNITY)
Admission: RE | Admit: 2020-06-07 | Discharge: 2020-06-07 | Disposition: A | Discharge status: Home / Self Care | Payer: Medicare PPO | Source: Ambulatory Visit | Attending: Urology | Admitting: Urology

## 2020-06-07 ENCOUNTER — Other Ambulatory Visit: Payer: Self-pay

## 2020-06-07 ENCOUNTER — Encounter (HOSPITAL_COMMUNITY)
Admission: RE | Admit: 2020-06-07 | Discharge: 2020-06-07 | Disposition: A | Discharge status: Home / Self Care | Payer: Medicare PPO | Source: Ambulatory Visit | Attending: Urology | Admitting: Urology

## 2020-06-07 DIAGNOSIS — C61 Malignant neoplasm of prostate: Secondary | ICD-10-CM | POA: Diagnosis not present

## 2020-06-07 IMAGING — CR DG CHEST 2V
2 series · 2 of 2 positions shown · non-contrast
Comparison: CT chest [DATE]

CLINICAL DATA: Preop prostate seed placement.

EXAM:
CHEST - 2 VIEW

[w chest pa]
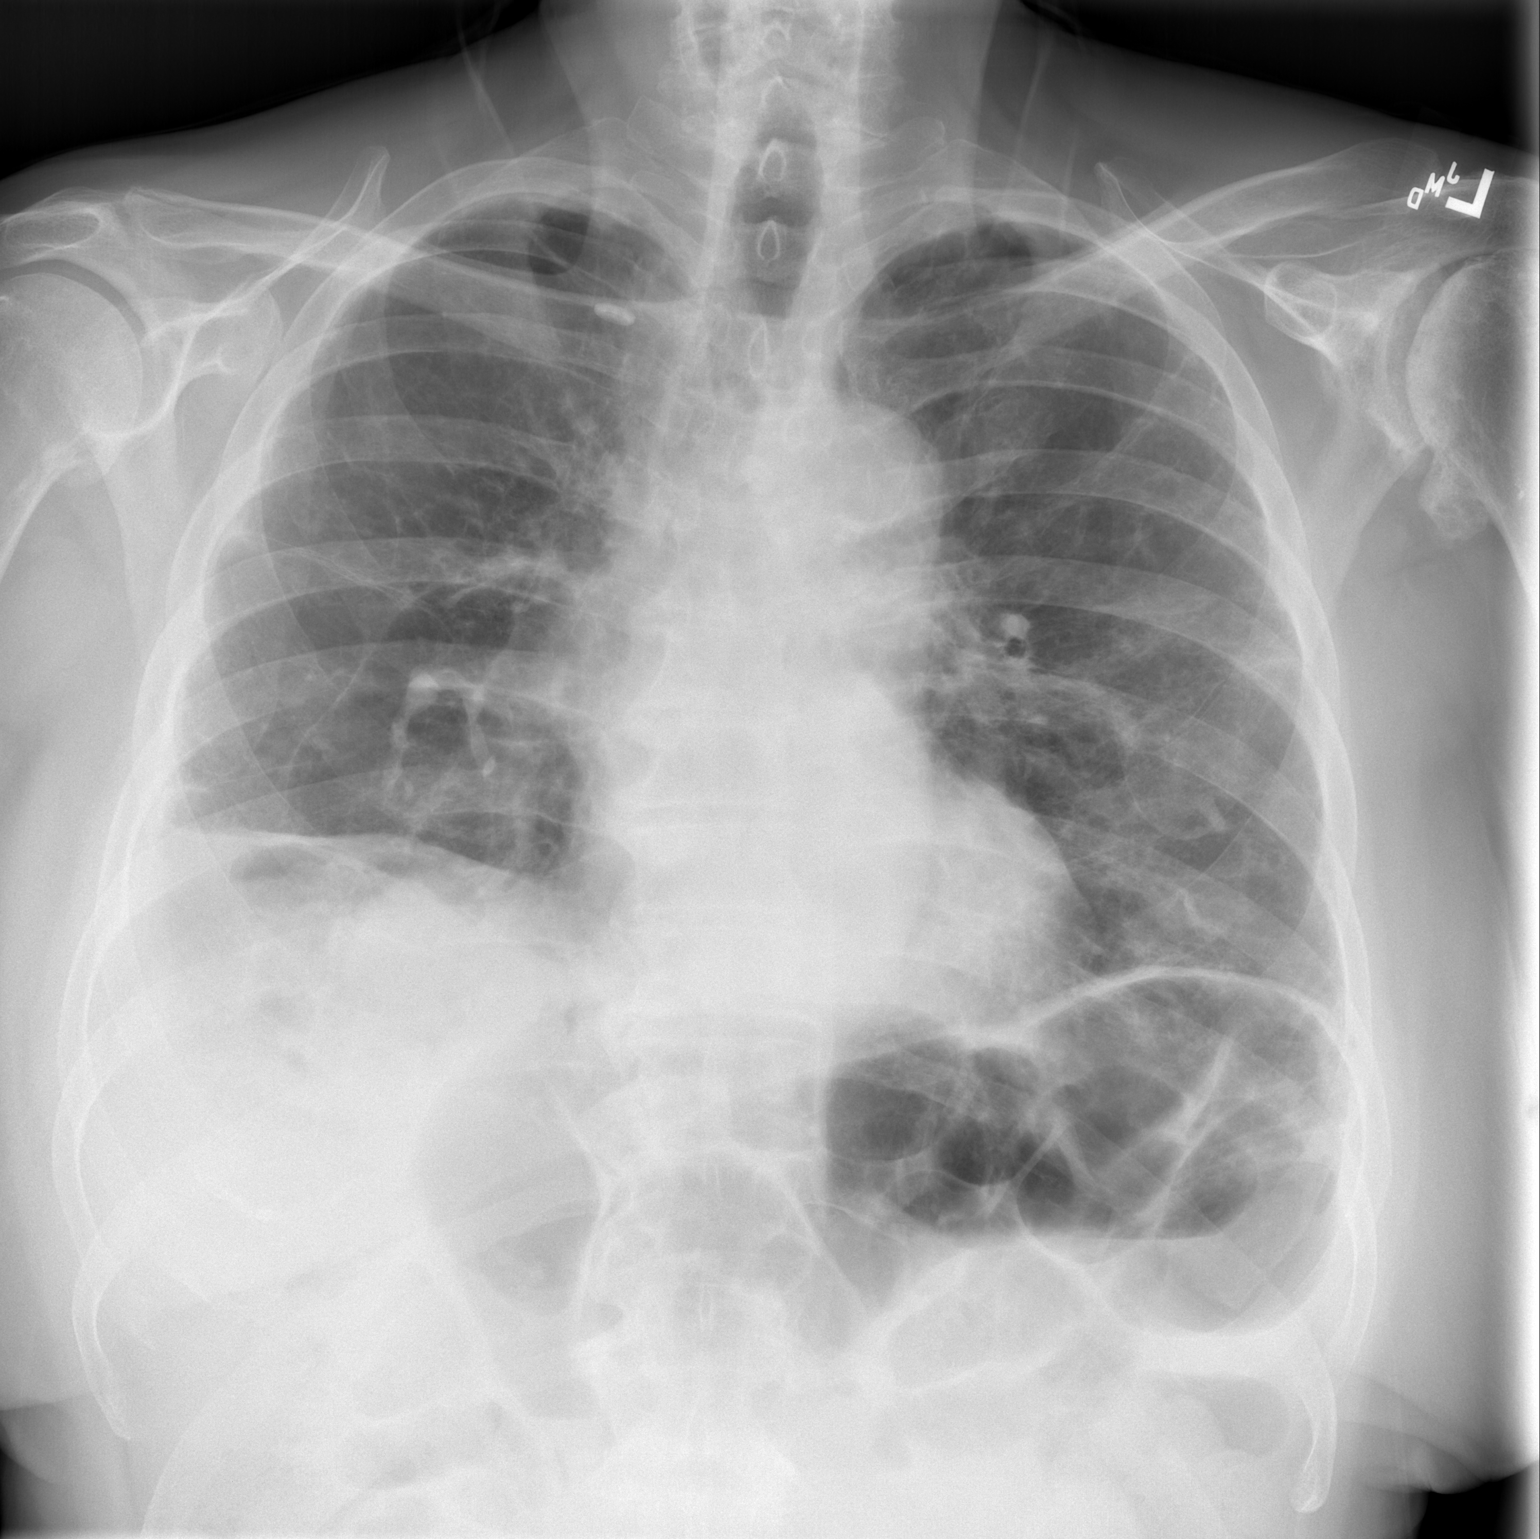

[w chest lat]
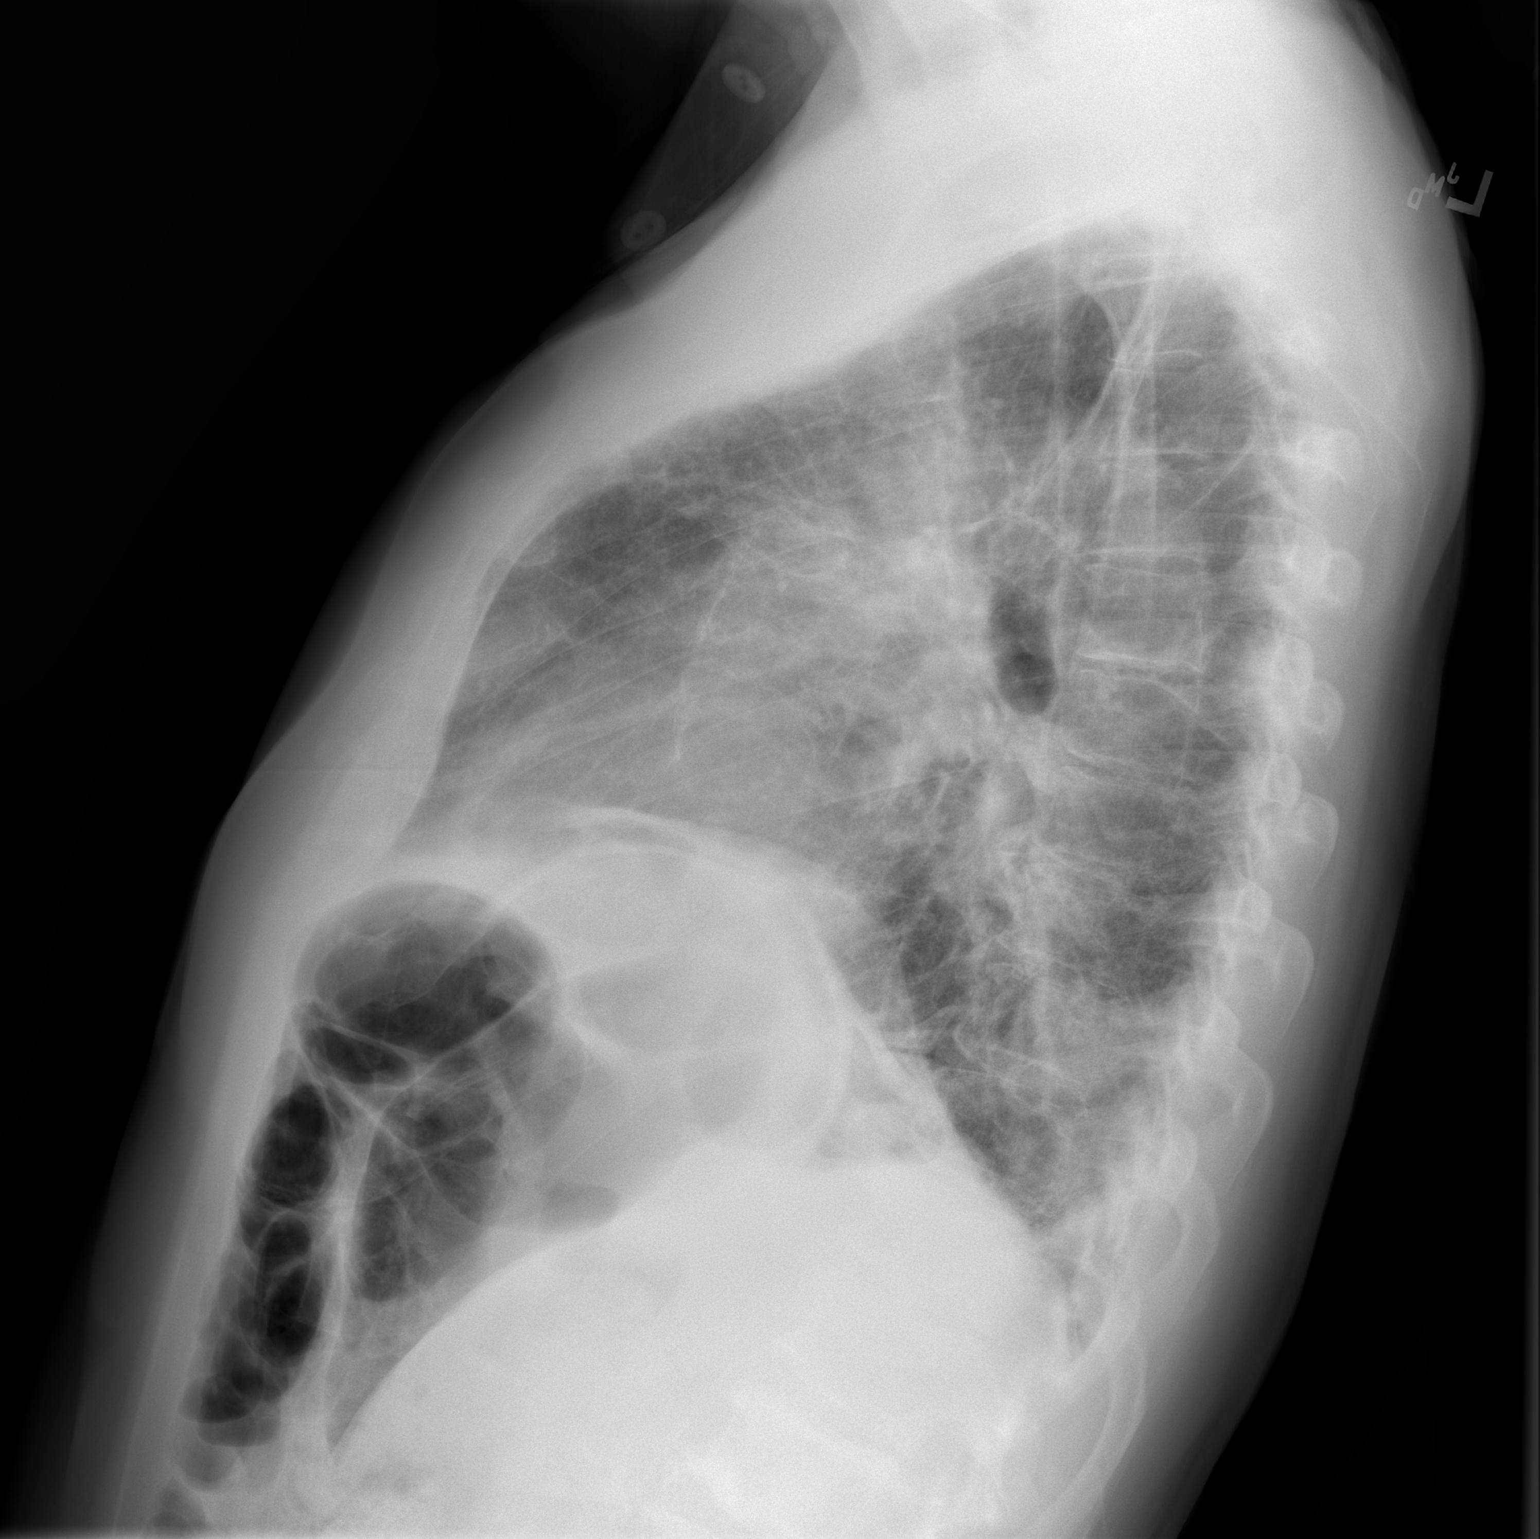

[2 of 2 positions shown; findings below may reference images not displayed]

FINDINGS: The heart size appears normal. Decreased lung volumes. Chronic
linear opacities within both lungs, likely reflecting
postinflammatory scarring. Persistent posterior right lower lobe
masslike opacification is identified and appears similar to recent
CT from [DATE]. No new findings.
IMPRESSION: 1. Persistent posterior right lower lobe masslike opacification.
Etiology is indeterminate. This may represent sequelae of recurrent
aspiration or pneumonia. The CT appearance on [DATE] was not
typical for malignancy. Consider follow-up imaging with repeat CT of
the chest to assess for any temporal change in the appearance of
this abnormality.

## 2020-06-07 NOTE — Progress Notes (Signed)
  Radiation Oncology         [phone] ________________________________  Name: Cody Carr MRN: [id number]  Date: 06/07/2020  DOB: November 16, 1945  SIMULATION AND TREATMENT PLANNING NOTE PUBIC ARCH STUDY  NW:GNFA, Weldon Picking, MD  Irine Seal, MD  DIAGNOSIS: 74 y.o. gentleman with Stage T1c adenocarcinoma of the prostate with Gleason score of 3+3, and PSA of 7.3.  Oncology History  Prostate cancer ([place])  12/09/2019 Initial Diagnosis   Prostate cancer ([place])   02/09/2020 Cancer Staging   Staging form: Prostate, AJCC 8th Edition - Clinical stage from 02/09/2020: Stage I (cT1c, cN0, cM0, PSA: 7.3, Grade Group: 1) - Signed by Freeman Caldron, PA-C on 05/11/2020       ICD-10-CM   1. Prostate cancer ([place])  C61     COMPLEX SIMULATION:  The patient presented today for evaluation for possible prostate seed implant. He was brought to the radiation planning suite and placed supine on the CT couch. A 3-dimensional image study set was obtained in upload to the planning computer. There, on each axial slice, I contoured the prostate gland. Then, using three-dimensional radiation planning tools I reconstructed the prostate in view of the structures from the transperineal needle pathway to assess for possible pubic arch interference. In doing so, I did not appreciate any pubic arch interference. Also, the patient's prostate volume was estimated based on the drawn structure. The volume was 43 cc.  Given the pubic arch appearance and prostate volume, patient remains a good candidate to proceed with prostate seed implant. Today, he freely provided informed written consent to proceed.    PLAN: The patient will undergo prostate seed implant.   ________________________________  Sheral Apley. Tammi Klippel, M.D.

## 2020-06-08 NOTE — Progress Notes (Signed)
Faxed chest xray results from 06-07-2020 to pam gibson, fax confirmation received and placed on chart. Left pam voicemail message saying chest xray results faxed from 06-07-2020 and fax me a copy of chest ct if dr Jeffie Pollock orders a chest ct

## 2020-07-11 NOTE — H&P (View-Only) (Signed)
07/12/2020 3:09 PM   Linus Mako Wogan May 04, 1946 [id number]  Referring provider: Monico Blitz, MD [address] [place],  [place] [address]  followup nephrolithiasis and prostate cancer.    Alven returns today in f/u.  He is scheduled for a prostate seed implant on 07/27/20.  He is voiding well since we got rid of the  stone.  His UA is clear. His IPSS is 7.    HPI: Mr Marinos is a 75yo here for followup for nephrolithiasis. He underwent ESWL on 03/06/20. He passed multiple fragments. No gross hematuria. No flank pain. His KUB on 03/20/20 showed no residual stones.  UA is clear.   He had a partially collapsed lung on the CT for the stone and a f/u Chest CT showed similar findings that are felt to be chronic.  I will send that result to his PCP.   He has prostate cancer on surveillance.  His PSA is up to 7.3.  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 His PSA on 8/6 was 5.9 which is up from about 4.5 on 06/17/19 .  He is voiding well without new complaints and he reports his stream is actually improved. He has no bone pain or weight loss.  His IPSS is 5.   He had an MRIP in 8/21 and had a surveillance biopsy and had 4 cores at the left base and mid prostate with Gleason 6 disease with the highest volume 56% at the left base lateral.  His prostate volume was 68ml.   His prior biopsy had 3 cores in the same area.    PMH: Past Medical History:  Diagnosis Date  . Hypertension   . Prostate cancer ([place])   . 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 EXTRACORPOREAL SHOCK WAVE LITHOTRIPSY Right 03/06/2020   Procedure: EXTRACORPOREAL SHOCK WAVE LITHOTRIPSY (ESWL);  Surgeon: Cleon Gustin, MD;  Location: [facility];  Service: Urology;  Laterality: Right;  . throat biopsy  2008    Home Medications:  Allergies as of 07/12/2020   No Known  Allergies     Medication List       Accurate as of July 12, 2020  3:09 PM. If you have any questions, ask your nurse or doctor.        Biotin 5000 MCG Caps Take 5,000 mcg by mouth daily.   lisinopril 2.5 MG tablet Commonly known as: ZESTRIL Take 2.5 mg by mouth every evening.   multivitamin with minerals Tabs tablet Take 1 tablet by mouth daily.   naproxen sodium 220 MG tablet Commonly known as: ALEVE Take 220-440 mg by mouth daily as needed (pain).   rosuvastatin 10 MG tablet Commonly known as: CRESTOR Take 10 mg by mouth daily.   Vitamin B-12 5000 MCG Subl Take 5,000 mcg by mouth daily.   Vitamin D3 50 MCG (2000 UT) Tabs Take 2,000 Units by mouth daily.       Allergies: No Known Allergies  Family History: Family History  Problem Relation Age of Onset  . Prostate cancer Neg Hx   . Breast cancer Neg Hx   . Colon cancer Neg Hx   . Pancreatic cancer Neg Hx     Social History:  reports that he quit smoking about 42 years ago. His smoking use included cigarettes. He  has a 6.00 pack-year smoking history. He has never used smokeless tobacco. He reports current alcohol use. He reports that he does not use drugs.  ROS: All other review of systems were reviewed and are negative except what is noted above in HPI  Physical Exam: BP (!) 161/77   Pulse 88   Temp 97.9 F (36.6 C)   Ht 6\' 1"  (1.854 m)   Wt 202 lb (91.6 kg)   BMI 26.65 kg/m   Gen WD,WN NAD A/O x 3. Lungs CTA CV RRR without murmur.   Laboratory Data: No results found for: WBC, HGB, HCT, MCV, PLT  Lab Results  Component Value Date   CREATININE 1.00 01/19/2020    Lab Results  Component Value Date   PSA 5.9 (H) 11/25/2019   PSA 4.4 (H) 06/17/2019   PSA on 04/06/20 was 7.3.  No results found for: TESTOSTERONE  No results found for: HGBA1C  Urinalysis    Component Value Date/Time   APPEARANCEUR Clear 04/13/2020 1544   GLUCOSEU Negative 04/13/2020 1544   BILIRUBINUR Negative  04/13/2020 1544   PROTEINUR Negative 04/13/2020 1544   NITRITE Negative 04/13/2020 1544   LEUKOCYTESUR Negative 04/13/2020 1544    Lab Results  Component Value Date   LABMICR Comment 04/13/2020   WBCUA None seen 02/24/2020   LABEPIT 0-10 02/24/2020   MUCUS Present 12/23/2019   BACTERIA Few 02/24/2020   UA is clear.  Pertinent Imaging: CLINICAL DATA:  Ureteral stone. Nephrolithiasis. Additional history provided: History of lithotripsy 2 weeks ago on right side.  EXAM: ABDOMEN - 1 VIEW  COMPARISON:  Prior abdominal radiographs 02/15/2020. CT abdomen/pelvis 01/19/2020.  FINDINGS: Nonobstructive bowel gas pattern. The previously demonstrated right ureteropelvic junction calculus is no longer appreciated. No renal calculus is no acute bony abnormality.  IMPRESSION: The right ureteropelvic junction calculus demonstrated on the prior examination of 02/15/2020 is no longer appreciated.   Electronically Signed   By: Kellie Simmering DO   On: 03/21/2020 14:37  CLINICAL DATA:  Atelectasis. Patient states he had x-ray done last month that showed collapsing right lung. No shortness breath or chest pain. History of throat and prostate cancer.  EXAM: CT CHEST WITHOUT CONTRAST  TECHNIQUE: Multidetector CT imaging of the chest was performed following the standard protocol without IV contrast.  COMPARISON:  No available recent chest imaging. Remote PET-CT 08/10/2006. Abdominal CT 01/19/2020.  FINDINGS: Cardiovascular: Extensive three-vessel coronary artery atherosclerosis with involvement of the aorta and great vessels. No acute vascular findings on noncontrast imaging. The heart size is normal. There is no pericardial effusion.  Mediastinum/Nodes: 1.6 cm right infrahilar node on image 76/2 appears slightly larger than on prior abdominal CT. No other enlarged mediastinal, hilar or axillary lymph nodes identified. The thyroid gland, trachea and esophagus demonstrate  no significant findings.  Lungs/Pleura: Increased loculated fluid posteriorly at the right costophrenic angle. There is progressive underlying chronic lung disease with biapical scarring and scattered subpleural reticulation. Focal consolidation is again noted dependently in the right lower lobe with associated volume loss, bronchiectasis and central calcifications, mildly progressive from recent abdominal CT and new from remote PET-CT. No other airspace opacities or suspicious pulmonary nodules.  Upper abdomen: No significant findings within the visualized upper abdomen.  Musculoskeletal/Chest wall: There is no chest wall mass or suspicious osseous finding. There are degenerative changes throughout the spine.  IMPRESSION: 1. Progressive chronic right lower lobe consolidation with associated volume loss, bronchiectasis and central calcifications compared with abdominal CT 2.5 months earlier, likely due to  chronic aspiration. The appearance is not suggestive of malignancy. Recommend radiographic or CT follow-up in 3-6 months. 2. Increased loculated right pleural effusion. 3. Mildly enlarged right infrahilar lymph node, likely reactive. 4. Chronic interstitial lung disease, mildly progressive from remote PET-CT. 5. Coronary artery and Aortic Atherosclerosis (ICD10-I70.0).   Electronically Signed   By: Richardean Sale M.D.   On: 04/06/2020 11:49  Assessment & Plan:    . Prostate cancer.   He has decided to procedure with a seed implant for his prostate cancer diagnosis with higher volume Gleason 6 disease.  He is voiding without major complaints.   We discussed the procedure and some of the possible complications.  I also discussed the use of SpaceOAR and the associated risks with that including infection, bowel injury and thrombotic events.      Irine Seal, MD  [facility] [place]

## 2020-07-11 NOTE — Progress Notes (Signed)
07/12/2020 3:09 PM   Cody Carr 1946/04/07 [id number]  Referring provider: Monico Blitz, MD [address] [place],  [place] [address]  followup nephrolithiasis and prostate cancer.    Cody Carr returns today in f/u.  He is scheduled for a prostate seed implant on 07/27/20.  He is voiding well since we got rid of the  stone.  His UA is clear. His IPSS is 7.    HPI: [name] is a 75yo here for followup for nephrolithiasis. He underwent ESWL on 03/06/20. He passed multiple fragments. No gross hematuria. No flank pain. His KUB on 03/20/20 showed no residual stones.  UA is clear.   He had a partially collapsed lung on the CT for the stone and a f/u Chest CT showed similar findings that are felt to be chronic.  I will send that result to his PCP.   He has prostate cancer on surveillance.  His PSA is up to 7.3.  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 His PSA on 8/6 was 5.9 which is up from about 4.5 on 06/17/19 .  He is voiding well without new complaints and he reports his stream is actually improved. He has no bone pain or weight loss.  His IPSS is 5.   He had an MRIP in 8/21 and had a surveillance biopsy and had 4 cores at the left base and mid prostate with Gleason 6 disease with the highest volume 56% at the left base lateral.  His prostate volume was 22ml.   His prior biopsy had 3 cores in the same area.    PMH: Past Medical History:  Diagnosis Date  . Hypertension   . Prostate cancer ([place])   . 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 EXTRACORPOREAL SHOCK WAVE LITHOTRIPSY Right 03/06/2020   Procedure: EXTRACORPOREAL SHOCK WAVE LITHOTRIPSY (ESWL);  Surgeon: Cody Gustin, MD;  Location: [facility];  Service: Urology;  Laterality: Right;  . throat biopsy  2008    Home Medications:  Allergies as of 07/12/2020   No Known  Allergies     Medication List       Accurate as of July 12, 2020  3:09 PM. If you have any questions, ask your nurse or doctor.        Biotin 5000 MCG Caps Take 5,000 mcg by mouth daily.   lisinopril 2.5 MG tablet Commonly known as: ZESTRIL Take 2.5 mg by mouth every evening.   multivitamin with minerals Tabs tablet Take 1 tablet by mouth daily.   naproxen sodium 220 MG tablet Commonly known as: ALEVE Take 220-440 mg by mouth daily as needed (pain).   rosuvastatin 10 MG tablet Commonly known as: CRESTOR Take 10 mg by mouth daily.   Vitamin B-12 5000 MCG Subl Take 5,000 mcg by mouth daily.   Vitamin D3 50 MCG (2000 UT) Tabs Take 2,000 Units by mouth daily.       Allergies: No Known Allergies  Family History: Family History  Problem Relation Age of Onset  . Prostate cancer Neg Hx   . Breast cancer Neg Hx   . Colon cancer Neg Hx   . Pancreatic cancer Neg Hx     Social History:  reports that he quit smoking about 42 years ago. His smoking use included cigarettes. He  has a 6.00 pack-year smoking history. He has never used smokeless tobacco. He reports current alcohol use. He reports that he does not use drugs.  ROS: All other review of systems were reviewed and are negative except what is noted above in HPI  Physical Exam: BP (!) 161/77   Pulse 88   Temp 97.9 F (36.6 C)   Ht 6\' 1"  (1.854 m)   Wt 202 lb (91.6 kg)   BMI 26.65 kg/m   Gen WD,WN NAD A/O x 3. Lungs CTA CV RRR without murmur.   Laboratory Data: No results found for: WBC, HGB, HCT, MCV, PLT  Lab Results  Component Value Date   CREATININE 1.00 01/19/2020    Lab Results  Component Value Date   PSA 5.9 (H) 11/25/2019   PSA 4.4 (H) 06/17/2019   PSA on 04/06/20 was 7.3.  No results found for: TESTOSTERONE  No results found for: HGBA1C  Urinalysis    Component Value Date/Time   APPEARANCEUR Clear 04/13/2020 1544   GLUCOSEU Negative 04/13/2020 1544   BILIRUBINUR Negative  04/13/2020 1544   PROTEINUR Negative 04/13/2020 1544   NITRITE Negative 04/13/2020 1544   LEUKOCYTESUR Negative 04/13/2020 1544    Lab Results  Component Value Date   LABMICR Comment 04/13/2020   WBCUA None seen 02/24/2020   LABEPIT 0-10 02/24/2020   MUCUS Present 12/23/2019   BACTERIA Few 02/24/2020   UA is clear.  Pertinent Imaging: CLINICAL DATA:  Ureteral stone. Nephrolithiasis. Additional history provided: History of lithotripsy 2 weeks ago on right side.  EXAM: ABDOMEN - 1 VIEW  COMPARISON:  Prior abdominal radiographs 02/15/2020. CT abdomen/pelvis 01/19/2020.  FINDINGS: Nonobstructive bowel gas pattern. The previously demonstrated right ureteropelvic junction calculus is no longer appreciated. No renal calculus is no acute bony abnormality.  IMPRESSION: The right ureteropelvic junction calculus demonstrated on the prior examination of 02/15/2020 is no longer appreciated.   Electronically Signed   By: Cody Simmering DO   On: 03/21/2020 14:37  CLINICAL DATA:  Atelectasis. Patient states he had x-ray done last month that showed collapsing right lung. No shortness breath or chest pain. History of throat and prostate cancer.  EXAM: CT CHEST WITHOUT CONTRAST  TECHNIQUE: Multidetector CT imaging of the chest was performed following the standard protocol without IV contrast.  COMPARISON:  No available recent chest imaging. Remote PET-CT 08/10/2006. Abdominal CT 01/19/2020.  FINDINGS: Cardiovascular: Extensive three-vessel coronary artery atherosclerosis with involvement of the aorta and great vessels. No acute vascular findings on noncontrast imaging. The heart size is normal. There is no pericardial effusion.  Mediastinum/Nodes: 1.6 cm right infrahilar node on image 76/2 appears slightly larger than on prior abdominal CT. No other enlarged mediastinal, hilar or axillary lymph nodes identified. The thyroid gland, trachea and esophagus demonstrate  no significant findings.  Lungs/Pleura: Increased loculated fluid posteriorly at the right costophrenic angle. There is progressive underlying chronic lung disease with biapical scarring and scattered subpleural reticulation. Focal consolidation is again noted dependently in the right lower lobe with associated volume loss, bronchiectasis and central calcifications, mildly progressive from recent abdominal CT and new from remote PET-CT. No other airspace opacities or suspicious pulmonary nodules.  Upper abdomen: No significant findings within the visualized upper abdomen.  Musculoskeletal/Chest wall: There is no chest wall mass or suspicious osseous finding. There are degenerative changes throughout the spine.  IMPRESSION: 1. Progressive chronic right lower lobe consolidation with associated volume loss, bronchiectasis and central calcifications compared with abdominal CT 2.5 months earlier, likely due to  chronic aspiration. The appearance is not suggestive of malignancy. Recommend radiographic or CT follow-up in 3-6 months. 2. Increased loculated right pleural effusion. 3. Mildly enlarged right infrahilar lymph node, likely reactive. 4. Chronic interstitial lung disease, mildly progressive from remote PET-CT. 5. Coronary artery and Aortic Atherosclerosis (ICD10-I70.0).   Electronically Signed   By: Richardean Sale M.D.   On: 04/06/2020 11:49  Assessment & Plan:    . Prostate cancer.   He has decided to procedure with a seed implant for his prostate cancer diagnosis with higher volume Gleason 6 disease.  He is voiding without major complaints.   We discussed the procedure and some of the possible complications.  I also discussed the use of SpaceOAR and the associated risks with that including infection, bowel injury and thrombotic events.      Irine Seal, MD  [facility] [place]

## 2020-07-12 ENCOUNTER — Ambulatory Visit (INDEPENDENT_AMBULATORY_CARE_PROVIDER_SITE_OTHER): Payer: Medicare PPO | Admitting: Urology

## 2020-07-12 ENCOUNTER — Other Ambulatory Visit: Payer: Self-pay

## 2020-07-12 VITALS — BP 161/77 | HR 88 | Temp 97.9°F | Ht 73.0 in | Wt 202.0 lb

## 2020-07-12 DIAGNOSIS — C61 Malignant neoplasm of prostate: Secondary | ICD-10-CM | POA: Diagnosis not present

## 2020-07-12 LAB — URINALYSIS, ROUTINE W REFLEX MICROSCOPIC
Bilirubin, UA: NEGATIVE
Glucose, UA: NEGATIVE
Ketones, UA: NEGATIVE
Leukocytes,UA: NEGATIVE
Nitrite, UA: NEGATIVE
Protein,UA: NEGATIVE
RBC, UA: NEGATIVE
Specific Gravity, UA: <1.005 — ABNORMAL LOW (ref 1.005–1.030)
Urobilinogen, Ur: 0.2 mg/dL (ref 0.2–1.0)
pH, UA: 6 (ref 5.0–7.5)

## 2020-07-12 NOTE — Progress Notes (Signed)
Urological Symptom Review  Patient is experiencing the following symptoms: Get up at night to urinate Stream starts and stops Weak stream Erection problems (male only)   Review of Systems  Gastrointestinal (upper)  : Negative for upper GI symptoms  Gastrointestinal (lower) : Diarrhea (occasional)  Constitutional : Negative for symptoms  Skin: Negative for skin symptoms  Eyes: Negative for eye symptoms  Ear/Nose/Throat : Negative for Ear/Nose/Throat symptoms  Hematologic/Lymphatic: Negative for Hematologic/Lymphatic symptoms  Cardiovascular : Negative for cardiovascular symptoms  Respiratory : Shortness of breath(during exercise)  Endocrine: Negative for endocrine symptoms  Musculoskeletal: Negative for musculoskeletal symptoms  Neurological: Dizziness  Psychologic: Negative for psychiatric symptoms

## 2020-07-16 NOTE — Progress Notes (Addendum)
Routed chest xray results  From 06-07-2020 to dr wrenn epic ib and left messahe with shirley to have dr Tammi Klippel review chest xray results

## 2020-07-18 ENCOUNTER — Telehealth: Payer: Self-pay

## 2020-07-18 ENCOUNTER — Other Ambulatory Visit: Payer: Self-pay

## 2020-07-18 DIAGNOSIS — R918 Other nonspecific abnormal finding of lung field: Secondary | ICD-10-CM

## 2020-07-18 DIAGNOSIS — C61 Malignant neoplasm of prostate: Secondary | ICD-10-CM

## 2020-07-18 NOTE — Telephone Encounter (Signed)
-----   Message from Irine Seal, MD sent at 07/17/2020  8:30 AM EDT ----- His preop CXR shows the lung mass that was evaluated on Chest CT in 12/21 and further f/u was recommended.   We need to get him scheduled for a CT Chest in the June time frame to assess stability of the lesion which is not felt to be more likely benign than malignant.   He has his seed implant coming up next week.

## 2020-07-18 NOTE — Telephone Encounter (Signed)
Ct ordered placed.

## 2020-07-23 ENCOUNTER — Telehealth: Payer: Self-pay | Admitting: *Deleted

## 2020-07-23 NOTE — Telephone Encounter (Signed)
Called patient to remind of lab and Covid testing for 07-25-20, spoke with patient and he is aware of these appts.

## 2020-07-24 ENCOUNTER — Encounter (HOSPITAL_BASED_OUTPATIENT_CLINIC_OR_DEPARTMENT_OTHER): Payer: Self-pay | Admitting: Urology

## 2020-07-24 ENCOUNTER — Other Ambulatory Visit: Payer: Self-pay

## 2020-07-24 NOTE — Progress Notes (Addendum)
Spoke w/ via phone for pre-op interview---pt Lab needs dos----   ekg 06-07-2020 epic, chest xray 06-07-2020 abnormal             Lab results------has lab appt 07-25-2020 900 am for cbc cmp  Pt ptt COVID test ------07-25-2020 1030 Arrive at -------530 am 07-27-2020  NPO after MN NO Solid Food.  Clear liquids from MN until---430 am then npo Med rec completed Medications to take morning of surgery -----none Diabetic medication -----n/a Patient instructed to bring photo id and insurance card day of surgery Patient aware to have Driver (ride ) / caregiver  Wife lynn will stay   for 24 hours after surgery  Patient Special Instructions -----fleets enema am of surgery Pre-Op special Istructions -----none Patient verbalized understanding of instructions that were given at this phone interview. Patient denies shortness of breath, chest pain, fever, cough at this phone interview.  Addendum : requested lov note dr Weldon Picking shah on 07-25-2020 and 07-26-2020 with medical records voice mail message x 2 dr Manuella Ghazi following pt for abnromal chest xray 06-07-2020, note from wr wrenn 06-08-2020 on pt chart for pt pcp to follow up on abnormal chest xray  ADDENDUM: LOV DR ASHISH [facility] 05-23-2020 RECEIVED BY FAX AND PLACED ON PT CHART.

## 2020-07-25 ENCOUNTER — Encounter (HOSPITAL_COMMUNITY)
Admission: RE | Admit: 2020-07-25 | Discharge: 2020-07-25 | Disposition: A | Discharge status: Home / Self Care | Payer: Medicare PPO | Source: Ambulatory Visit | Attending: Urology | Admitting: Urology

## 2020-07-25 ENCOUNTER — Other Ambulatory Visit (HOSPITAL_COMMUNITY)
Admission: RE | Admit: 2020-07-25 | Discharge: 2020-07-25 | Disposition: A | Discharge status: Home / Self Care | Payer: Medicare PPO | Source: Ambulatory Visit | Attending: Urology | Admitting: Urology

## 2020-07-25 DIAGNOSIS — Z20822 Contact with and (suspected) exposure to covid-19: Secondary | ICD-10-CM | POA: Insufficient documentation

## 2020-07-25 DIAGNOSIS — Z01812 Encounter for preprocedural laboratory examination: Secondary | ICD-10-CM | POA: Diagnosis present

## 2020-07-25 LAB — CBC
HCT: 40.4 % (ref 39.0–52.0)
Hemoglobin: 12.6 g/dL — ABNORMAL LOW (ref 13.0–17.0)
MCH: 27.7 pg (ref 26.0–34.0)
MCHC: 31.2 g/dL (ref 30.0–36.0)
MCV: 88.8 fL (ref 80.0–100.0)
Platelets: 332 10*3/uL (ref 150–400)
RBC: 4.55 MIL/uL (ref 4.22–5.81)
RDW: 14.6 % (ref 11.5–15.5)
WBC: 10.2 10*3/uL (ref 4.0–10.5)
nRBC: 0 % (ref 0.0–0.2)

## 2020-07-25 LAB — PROTIME-INR
INR: 1.1 (ref 0.8–1.2)
Prothrombin Time: 14 seconds (ref 11.4–15.2)

## 2020-07-25 LAB — COMPREHENSIVE METABOLIC PANEL
ALT: 22 U/L (ref 0–44)
AST: 18 U/L (ref 15–41)
Albumin: 3.9 g/dL (ref 3.5–5.0)
Alkaline Phosphatase: 93 U/L (ref 38–126)
Anion gap: 8 (ref 5–15)
BUN: 23 mg/dL (ref 8–23)
CO2: 25 mmol/L (ref 22–32)
Calcium: 10.1 mg/dL (ref 8.9–10.3)
Chloride: 106 mmol/L (ref 98–111)
Creatinine, Ser: 0.9 mg/dL (ref 0.61–1.24)
GFR, Estimated: >60 mL/min (ref >60)
Glucose, Bld: 107 mg/dL — ABNORMAL HIGH (ref 70–99)
Potassium: 4.5 mmol/L (ref 3.5–5.1)
Sodium: 139 mmol/L (ref 135–145)
Total Bilirubin: 0.8 mg/dL (ref 0.3–1.2)
Total Protein: 7.6 g/dL (ref 6.5–8.1)

## 2020-07-25 LAB — SARS CORONAVIRUS 2 (TAT 6-24 HRS): SARS Coronavirus 2: NEGATIVE

## 2020-07-25 LAB — APTT: aPTT: 37 seconds — ABNORMAL HIGH (ref 24–36)

## 2020-07-25 NOTE — Progress Notes (Signed)
Patient states nervous at pre op bp right arm is 175/95 and left arm 171/67.

## 2020-07-26 ENCOUNTER — Telehealth: Payer: Self-pay | Admitting: *Deleted

## 2020-07-26 NOTE — Anesthesia Preprocedure Evaluation (Addendum)
Anesthesia Evaluation  Patient identified by MRN, date of birth, ID band Patient awake    Reviewed: Allergy & Precautions, NPO status , Patient's Chart, lab work & pertinent test results  Airway Mallampati: II  TM Distance: >3 FB Neck ROM: Full    Dental  (+) Edentulous Upper, Edentulous Lower, Dental Advisory Given   Pulmonary shortness of breath, former smoker,    Pulmonary exam normal breath sounds clear to auscultation       Cardiovascular hypertension, Pt. on medications Normal cardiovascular exam Rhythm:Regular Rate:Normal     Neuro/Psych negative neurological ROS     GI/Hepatic negative GI ROS, Neg liver ROS,   Endo/Other  negative endocrine ROS  Renal/GU negative Renal ROS     Musculoskeletal negative musculoskeletal ROS (+)   Abdominal   Peds  Hematology negative hematology ROS (+)   Anesthesia Other Findings   Reproductive/Obstetrics                            Anesthesia Physical Anesthesia Plan  ASA: II  Anesthesia Plan: General   Post-op Pain Management:    Induction: Intravenous  PONV Risk Score and Plan: Ondansetron, Dexamethasone, Treatment may vary due to age or medical condition and Diphenhydramine  Airway Management Planned: LMA  Additional Equipment: None  Intra-op Plan:   Post-operative Plan: Extubation in OR  Informed Consent: I have reviewed the patients History and Physical, chart, labs and discussed the procedure including the risks, benefits and alternatives for the proposed anesthesia with the patient or authorized representative who has indicated his/her understanding and acceptance.     Dental advisory given  Plan Discussed with: CRNA  Anesthesia Plan Comments:        Anesthesia Quick Evaluation

## 2020-07-26 NOTE — Telephone Encounter (Signed)
Called patient to remind of procedure for 07-27-20, spoke with  patient and he is aware of this procedure.

## 2020-07-26 NOTE — Progress Notes (Signed)
  Radiation Oncology         [phone] ________________________________  Name: Cody Carr MRN: [id number]  Date: 07/26/2020  DOB: 08-08-1945       Prostate Seed Implant  KP:TWSF, Cody Picking, MD  No ref. provider found  DIAGNOSIS:  75 y.o. gentleman with Stage T1c adenocarcinoma of the prostate with Gleason score of 3+3, and PSA of 7.3.  Oncology History  Prostate cancer ([place])  12/09/2019 Initial Diagnosis   Prostate cancer ([place])   02/09/2020 Cancer Staging   Staging form: Prostate, AJCC 8th Edition - Clinical stage from 02/09/2020: Stage I (cT1c, cN0, cM0, PSA: 7.3, Grade Group: 1) - Signed by Freeman Caldron, PA-C on 05/11/2020     No diagnosis found.  PROCEDURE: Insertion of radioactive I-125 seeds into the prostate gland.  RADIATION DOSE: 145 Gy, definitive therapy.  TECHNIQUE: Cody Carr was brought to the operating room with the urologist. He was placed in the dorsolithotomy position. He was catheterized and a rectal tube was inserted. The perineum was shaved, prepped and draped. The ultrasound probe was then introduced into the rectum to see the prostate gland.  TREATMENT DEVICE: A needle grid was attached to the ultrasound probe stand and anchor needles were placed.  3D PLANNING: The prostate was imaged in 3D using a sagittal sweep of the prostate probe. These images were transferred to the planning computer. There, the prostate, urethra and rectum were defined on each axial reconstructed image. Then, the software created an optimized 3D plan and a few seed positions were adjusted. The quality of the plan was reviewed using [facility] information for the target and the following two organs at risk:  Urethra and Rectum.  Then the accepted plan was printed and handed off to the radiation therapist.  Under my supervision, the custom loading of the seeds and spacers was carried out and loaded into sealed vicryl sleeves.  These pre-loaded needles were then placed into the needle  holder.Marland Kitchen  PROSTATE VOLUME STUDY:  Using transrectal ultrasound the volume of the prostate was verified to be 36.6 cc.  SPECIAL TREATMENT PROCEDURE/SUPERVISION AND HANDLING: The pre-loaded needles were then delivered under sagittal guidance. A total of 19 needles were used to deposit 63 seeds in the prostate gland. The individual seed activity was 0.485 mCi.  SpaceOAR:  Yes  COMPLEX SIMULATION: At the end of the procedure, an anterior radiograph of the pelvis was obtained to document seed positioning and count. Cystoscopy was performed to check the urethra and bladder.  MICRODOSIMETRY: At the end of the procedure, the patient was emitting 0.137 mR/hr at 1 meter. Accordingly, he was considered safe for hospital discharge.  PLAN: The patient will return to the radiation oncology clinic for post implant CT dosimetry in three weeks.   ________________________________  Sheral Apley Tammi Klippel, M.D.

## 2020-07-27 ENCOUNTER — Ambulatory Visit (HOSPITAL_BASED_OUTPATIENT_CLINIC_OR_DEPARTMENT_OTHER): Payer: Medicare PPO | Admitting: Anesthesiology

## 2020-07-27 ENCOUNTER — Encounter (HOSPITAL_BASED_OUTPATIENT_CLINIC_OR_DEPARTMENT_OTHER): Payer: Self-pay | Admitting: Urology

## 2020-07-27 ENCOUNTER — Ambulatory Visit (HOSPITAL_BASED_OUTPATIENT_CLINIC_OR_DEPARTMENT_OTHER)
Admission: RE | Admit: 2020-07-27 | Discharge: 2020-07-27 | Disposition: A | Discharge status: Home / Self Care | Payer: Medicare PPO | Source: Ambulatory Visit | Attending: Urology | Admitting: Urology

## 2020-07-27 ENCOUNTER — Encounter (HOSPITAL_BASED_OUTPATIENT_CLINIC_OR_DEPARTMENT_OTHER)
Admission: RE | Disposition: A | Discharge status: Home / Self Care | Payer: Self-pay | Source: Ambulatory Visit | Attending: Urology

## 2020-07-27 ENCOUNTER — Other Ambulatory Visit: Payer: Self-pay

## 2020-07-27 ENCOUNTER — Ambulatory Visit (HOSPITAL_COMMUNITY): Payer: Medicare PPO

## 2020-07-27 DIAGNOSIS — Z87891 Personal history of nicotine dependence: Secondary | ICD-10-CM | POA: Diagnosis not present

## 2020-07-27 DIAGNOSIS — C61 Malignant neoplasm of prostate: Secondary | ICD-10-CM | POA: Insufficient documentation

## 2020-07-27 DIAGNOSIS — Z79899 Other long term (current) drug therapy: Secondary | ICD-10-CM | POA: Diagnosis not present

## 2020-07-27 HISTORY — PX: RADIOACTIVE SEED IMPLANT: SHX5150

## 2020-07-27 HISTORY — PX: SPACE OAR INSTILLATION: SHX6769

## 2020-07-27 HISTORY — DX: Personal history of urinary calculi: Z87.442

## 2020-07-27 HISTORY — DX: Dyspnea, unspecified: R06.00

## 2020-07-27 HISTORY — DX: Hyperlipidemia, unspecified: E78.5

## 2020-07-27 SURGERY — INSERTION, RADIATION SOURCE, PROSTATE
Anesthesia: General | Site: Rectum

## 2020-07-27 MED ORDER — SODIUM CHLORIDE (PF) 0.9 % IJ SOLN
INTRAMUSCULAR | Status: DC | PRN
  Administered 2020-07-27: 10 mL

## 2020-07-27 MED ORDER — AMISULPRIDE (ANTIEMETIC) 5 MG/2ML IV SOLN: 10.0000 mg | Freq: Once | INTRAVENOUS | Status: DC | PRN

## 2020-07-27 MED ORDER — LIDOCAINE 2% (20 MG/ML) 5 ML SYRINGE
INTRAMUSCULAR | Status: AC
  Filled 2020-07-27: qty 10

## 2020-07-27 MED ORDER — LACTATED RINGERS IV SOLN: INTRAVENOUS | Status: DC

## 2020-07-27 MED ORDER — DEXAMETHASONE SODIUM PHOSPHATE 4 MG/ML IJ SOLN
INTRAMUSCULAR | Status: DC | PRN
  Administered 2020-07-27: 8 mg via INTRAVENOUS

## 2020-07-27 MED ORDER — PROPOFOL 10 MG/ML IV BOLUS
INTRAVENOUS | Status: DC | PRN
  Administered 2020-07-27: 130 mg via INTRAVENOUS
  Administered 2020-07-27: 50 mg via INTRAVENOUS

## 2020-07-27 MED ORDER — FENTANYL CITRATE (PF) 100 MCG/2ML IJ SOLN
INTRAMUSCULAR | Status: AC
  Filled 2020-07-27: qty 2

## 2020-07-27 MED ORDER — ONDANSETRON HCL 4 MG/2ML IJ SOLN
INTRAMUSCULAR | Status: AC
  Filled 2020-07-27: qty 2

## 2020-07-27 MED ORDER — ARTIFICIAL TEARS OPHTHALMIC OINT
TOPICAL_OINTMENT | OPHTHALMIC | Status: AC
  Filled 2020-07-27: qty 3.5

## 2020-07-27 MED ORDER — KETOROLAC TROMETHAMINE 30 MG/ML IJ SOLN
INTRAMUSCULAR | Status: AC
  Filled 2020-07-27: qty 1

## 2020-07-27 MED ORDER — ONDANSETRON HCL 4 MG/2ML IJ SOLN
INTRAMUSCULAR | Status: DC | PRN
  Administered 2020-07-27: 4 mg via INTRAVENOUS

## 2020-07-27 MED ORDER — HYDROCODONE-ACETAMINOPHEN 5-325 MG PO TABS: 1.0000 | ORAL_TABLET | Freq: Four times a day (QID) | ORAL | 0 refills | Status: DC | PRN

## 2020-07-27 MED ORDER — CIPROFLOXACIN IN D5W 400 MG/200ML IV SOLN
INTRAVENOUS | Status: AC
  Filled 2020-07-27: qty 200

## 2020-07-27 MED ORDER — SODIUM CHLORIDE 0.9 % IR SOLN
Status: DC | PRN
  Administered 2020-07-27: 1000 mL via INTRAVESICAL

## 2020-07-27 MED ORDER — DEXAMETHASONE SODIUM PHOSPHATE 10 MG/ML IJ SOLN
INTRAMUSCULAR | Status: AC
  Filled 2020-07-27: qty 1

## 2020-07-27 MED ORDER — PROPOFOL 10 MG/ML IV BOLUS
INTRAVENOUS | Status: AC
  Filled 2020-07-27: qty 40

## 2020-07-27 MED ORDER — SODIUM CHLORIDE 0.9% FLUSH: 3.0000 mL | Freq: Two times a day (BID) | INTRAVENOUS | Status: DC

## 2020-07-27 MED ORDER — FLEET ENEMA 7-19 GM/118ML RE ENEM: 1.0000 | ENEMA | Freq: Once | RECTAL | Status: DC

## 2020-07-27 MED ORDER — EPHEDRINE SULFATE 50 MG/ML IJ SOLN
INTRAMUSCULAR | Status: DC | PRN
  Administered 2020-07-27 (×6): 5 mg via INTRAVENOUS

## 2020-07-27 MED ORDER — PHENYLEPHRINE HCL (PRESSORS) 10 MG/ML IV SOLN
INTRAVENOUS | Status: DC | PRN
  Administered 2020-07-27 (×4): 40 ug via INTRAVENOUS

## 2020-07-27 MED ORDER — FENTANYL CITRATE (PF) 100 MCG/2ML IJ SOLN
INTRAMUSCULAR | Status: DC | PRN
  Administered 2020-07-27: 25 ug via INTRAVENOUS
  Administered 2020-07-27: 50 ug via INTRAVENOUS

## 2020-07-27 MED ORDER — LIDOCAINE HCL (CARDIAC) PF 100 MG/5ML IV SOSY
PREFILLED_SYRINGE | INTRAVENOUS | Status: DC | PRN
  Administered 2020-07-27: 40 mg via INTRAVENOUS
  Administered 2020-07-27: 100 mg via INTRAVENOUS

## 2020-07-27 MED ORDER — CIPROFLOXACIN IN D5W 400 MG/200ML IV SOLN
400.0000 mg | INTRAVENOUS | Status: AC
  Administered 2020-07-27: 400 mg via INTRAVENOUS

## 2020-07-27 MED ORDER — IOHEXOL 300 MG/ML  SOLN
INTRAMUSCULAR | Status: DC | PRN
  Administered 2020-07-27: 7 mL

## 2020-07-27 MED ORDER — FENTANYL CITRATE (PF) 100 MCG/2ML IJ SOLN: 25.0000 ug | INTRAMUSCULAR | Status: DC | PRN

## 2020-07-27 SURGICAL SUPPLY — 40 items
BAG DRN RND TRDRP ANRFLXCHMBR (UROLOGICAL SUPPLIES) ×2
BAG URINE DRAIN 2000ML AR STRL (UROLOGICAL SUPPLIES) ×3 IMPLANT
BLADE CLIPPER SENSICLIP SURGIC (BLADE) ×3 IMPLANT
CATH FOLEY 2WAY SLVR  5CC 16FR (CATHETERS) ×3
CATH FOLEY 2WAY SLVR 5CC 16FR (CATHETERS) ×2 IMPLANT
CATH ROBINSON RED A/P 16FR (CATHETERS) IMPLANT
CATH ROBINSON RED A/P 20FR (CATHETERS) ×3 IMPLANT
CLOTH BEACON ORANGE TIMEOUT ST (SAFETY) ×3 IMPLANT
CNTNR URN SCR LID CUP LEK RST (MISCELLANEOUS) ×4 IMPLANT
CONT SPEC 4OZ STRL OR WHT (MISCELLANEOUS) ×6
COVER BACK TABLE 60X90IN (DRAPES) ×3 IMPLANT
COVER MAYO STAND STRL (DRAPES) ×3 IMPLANT
DRAPE C-ARM 35X43 STRL (DRAPES) ×3 IMPLANT
DRSG TEGADERM 4X4.75 (GAUZE/BANDAGES/DRESSINGS) ×5 IMPLANT
DRSG TEGADERM 8X12 (GAUZE/BANDAGES/DRESSINGS) ×6 IMPLANT
GAUZE SPONGE 4X4 12PLY STRL (GAUZE/BANDAGES/DRESSINGS) ×1 IMPLANT
GLOVE SURG ENC MOIS LTX SZ6.5 (GLOVE) ×3 IMPLANT
GLOVE SURG ENC MOIS LTX SZ7.5 (GLOVE) IMPLANT
GLOVE SURG ENC MOIS LTX SZ8 (GLOVE) IMPLANT
GLOVE SURG ORTHO LTX SZ8.5 (GLOVE) ×3 IMPLANT
GLOVE SURG POLYISO LF SZ6.5 (GLOVE) IMPLANT
GLOVE SURG POLYISO LF SZ8 (GLOVE) ×6 IMPLANT
GOWN STRL REUS W/TWL LRG LVL3 (GOWN DISPOSABLE) ×3 IMPLANT
GOWN STRL REUS W/TWL XL LVL3 (GOWN DISPOSABLE) ×3 IMPLANT
HOLDER FOLEY CATH W/STRAP (MISCELLANEOUS) ×3 IMPLANT
IMPL SPACEOAR VUE SYSTEM (Spacer) IMPLANT
IMPLANT SPACEOAR VUE SYSTEM (Spacer) ×3 IMPLANT
ISEED AGX 100 ×63 IMPLANT
IV NS 1000ML (IV SOLUTION) ×3
IV NS 1000ML BAXH (IV SOLUTION) ×2 IMPLANT
KIT TURNOVER CYSTO (KITS) ×3 IMPLANT
MARKER SKIN DUAL TIP RULER LAB (MISCELLANEOUS) ×3 IMPLANT
PACK CYSTO (CUSTOM PROCEDURE TRAY) ×3 IMPLANT
SURGILUBE 2OZ TUBE FLIPTOP (MISCELLANEOUS) IMPLANT
SUT BONE WAX W31G (SUTURE) IMPLANT
SYR 10ML LL (SYRINGE) ×1 IMPLANT
TOWEL OR 17X26 10 PK STRL BLUE (TOWEL DISPOSABLE) ×3 IMPLANT
UNDERPAD 30X36 HEAVY ABSORB (UNDERPADS AND DIAPERS) ×6 IMPLANT
WATER STERILE IRR 3000ML UROMA (IV SOLUTION) ×3 IMPLANT
WATER STERILE IRR 500ML POUR (IV SOLUTION) ×3 IMPLANT

## 2020-07-27 NOTE — Anesthesia Procedure Notes (Signed)
Procedure Name: LMA Insertion Date/Time: 07/27/2020 7:40 AM Performed by: Georgeanne Nim, CRNA Pre-anesthesia Checklist: Patient identified, Emergency Drugs available, Suction available, Patient being monitored and Timeout performed Patient Re-evaluated:Patient Re-evaluated prior to induction Oxygen Delivery Method: Circle system utilized Preoxygenation: Pre-oxygenation with 100% oxygen Induction Type: IV induction Ventilation: Mask ventilation without difficulty LMA: LMA inserted LMA Size: 5.0 Number of attempts: 1 Placement Confirmation: positive ETCO2,  CO2 detector and breath sounds checked- equal and bilateral Tube secured with: Tape Dental Injury: Teeth and Oropharynx as per pre-operative assessment

## 2020-07-27 NOTE — Anesthesia Postprocedure Evaluation (Signed)
Anesthesia Post Note  Patient: Cody Carr  Procedure(s) Performed: RADIOACTIVE SEED IMPLANT/BRACHYTHERAPY IMPLANT (N/A Prostate) SPACE OAR INSTILLATION (N/A Rectum)     Patient location during evaluation: PACU Anesthesia Type: General Level of consciousness: sedated and patient cooperative Pain management: pain level controlled Vital Signs Assessment: post-procedure vital signs reviewed and stable Respiratory status: spontaneous breathing Cardiovascular status: stable Anesthetic complications: no   No complications documented.  Last Vitals:  Vitals:   07/27/20 0915 07/27/20 1015  BP: 129/74 (!) 145/84  Pulse: 80 83  Resp: 16 16  Temp:  36.5 C  SpO2: 98% 100%    Last Pain:  Vitals:   07/27/20 1015  TempSrc:   PainSc: 0-No pain                 Nolon Nations

## 2020-07-27 NOTE — Op Note (Signed)
PATIENT:  Cody Carr  PRE-OPERATIVE DIAGNOSIS:  Adenocarcinoma of the prostate  POST-OPERATIVE DIAGNOSIS:  Same  PROCEDURE:  Procedure(s): 1. I-125 radioactive seed implantation 2. SpaceOAR implantation. 3.  Cystoscopy  SURGEON:  Surgeon(s): Irine Seal MD  Radiation oncologist: Dr. Tyler Pita  ANESTHESIA:  General  EBL:  3ml  DRAINS: none  INDICATION: [name] is a 75 y.o. with Stage T1c, Gleason 6 prostate cancer who has elected brachytherapy for treatment.  Description of procedure: After informed consent the patient was brought to the major OR, placed on the table and administered general anesthesia. He was then moved to the modified lithotomy position with his perineum perpendicular to the floor. His perineum and genitalia were then sterilely prepped. An official timeout was then performed. A 16 French Foley catheter was then placed in the bladder and filled with dilute contrast, a rectal tube was placed in the rectum and the transrectal ultrasound probe was placed in the rectum and affixed to the stand. He was then sterilely draped.  The sterile grid was installed.   Anchor needles were then placed.   Real time ultrasonography was used along with the seed planning software spot-pro version 3.1-00. This was used to develop the seed plan including the number of needles as well as number of seeds required for complete and adequate coverage. Real-time ultrasonography was then used along with the previously developed plan  to implant a total of 63 seeds using 19 needles for a target dose of 145 Gy. This proceeded without difficulty or complication.  The anchor needles and guide were removed and the SpaceOAR needle was passed under US guidance into the fat stripe posterior to the prostate with the tip in the midline at mid prostate. A puff of NS confirmed appropriate positioning and the SpaceOAR view polymer was then injected over 12 seconds into the space with excellent  distribution.     A Foley catheter was then removed as well as the transrectal ultrasound probe and rectal probe. Flexible cystoscopy was then performed using the 17 French flexible scope which revealed a normal urethra throughout its length down to the sphincter which appeared intact. The prostatic urethra was 2cm with bilobar hyperplasia with some coaptation. The bladder was then entered and fully and systematically inspected. There was mild trabeculation.  The ureteral orifices were noted to be of normal configuration and position. The mucosa revealed no evidence of tumors. There were also no stones identified within the bladder.  There was a small fresh blood clot. No seeds or spacers were seen and/or removed from the bladder.  The cystoscope was then removed.  The drapes were removed.  The perineum was cleaned and dressed.  He was taken out of the lithotomy position and was awakened and taken to recovery room in stable and satisfactory condition. He tolerated procedure well and there were no intraoperative complications.

## 2020-07-27 NOTE — Interval H&P Note (Signed)
History and Physical Interval Note: no change  07/27/2020 7:13 AM  Cody Carr  has presented today for surgery, with the diagnosis of PROSTATE CANCER.  The various methods of treatment have been discussed with the patient and family. After consideration of risks, benefits and other options for treatment, the patient has consented to  Procedure(s): RADIOACTIVE SEED IMPLANT/BRACHYTHERAPY IMPLANT (N/A) SPACE OAR INSTILLATION (N/A) as a surgical intervention.  The patient's history has been reviewed, patient examined, no change in status, stable for surgery.  I have reviewed the patient's chart and labs.  Questions were answered to the patient's satisfaction.     Irine Seal

## 2020-07-27 NOTE — Transfer of Care (Signed)
Immediate Anesthesia Transfer of Care Note  Patient: Cody Carr  Procedure(s) Performed: RADIOACTIVE SEED IMPLANT/BRACHYTHERAPY IMPLANT (N/A Prostate) SPACE OAR INSTILLATION (N/A Rectum)  Patient Location: PACU  Anesthesia Type:General  Level of Consciousness: awake, alert , oriented and patient cooperative  Airway & Oxygen Therapy: Patient Spontanous Breathing and Patient connected to nasal cannula oxygen  Post-op Assessment: Report given to RN and Post -op Vital signs reviewed and stable  Post vital signs: Reviewed and stable  Last Vitals:  Vitals Value Taken Time  BP    Temp    Pulse 83 07/27/20 0904  Resp 13 07/27/20 0904  SpO2 97 % 07/27/20 0904  Vitals shown include unvalidated device data.  Last Pain:  Vitals:   07/27/20 0619  TempSrc: Oral  PainSc: 0-No pain      Patients Stated Pain Goal: 6 (44/45/84 8350)  Complications: No complications documented.

## 2020-07-27 NOTE — Discharge Instructions (Signed)
Brachytherapy for Prostate Cancer, Care After This sheet gives you information about how to care for yourself after your procedure. Your health care provider may also give you more specific instructions. If you have problems or questions, contact your health care provider. What can I expect after the procedure? After the procedure, it is common to have:  Urinary symptoms. These may include: ? Trouble passing urine. ? Blood in the urine or semen. ? Frequent feeling of an urgent need to urinate.  Constipation, nausea, or bloating and gas.  Bruising, swelling, and tenderness of the area beneath the scrotum (perineum).  Tiredness (fatigue).  Burning or pain in the rectum.  Problems getting or keeping an erection (erectile dysfunction). Follow these instructions at home: Eating and drinking  Drink enough fluid to keep your urine pale yellow.  Eat a healthy, balanced diet. This includes lean proteins, whole grains, and plenty of fruits and vegetables.  If you drink alcohol: ? Limit how much you have to 0-2 drinks a day. ? Be aware of how much alcohol is in your drink. In the [place] one drink equals one 12 oz bottle of beer (355 mL), one 5 oz glass of wine (148 mL), or one 1 oz glass of hard liquor (44 mL).   Managing pain, stiffness, and swelling  If directed, put ice on the affected area. To do this: ? Put ice in a plastic bag. ? Place a towel between your skin and the bag. ? Leave the ice on for 20 minutes, 2-3 times a day.  Try not to sit directly on the perineum. A soft cushion can help with discomfort.   Activity  If you were given a sedative during the procedure, it can affect you for several hours. Do not drive or operate machinery until your health care provider says that it is safe.  Do not lift anything that is heavier than 10 lb (4.5 kg), or the limit that you are told, until your health care provider says that it is safe.  Rest as told by your health care  provider.  Return to your normal activities as told by your health care provider. Most people can return to normal activities a few days or weeks after the procedure. Ask your health care provider what activities are safe for you.   Treatment area care Check your treatment area every day for signs of infection. Check for:  Redness, swelling, or pain.  Fluid or blood.  Warmth.  Pus or a bad smell. Managing constipation Your procedure may cause constipation. To prevent or treat constipation, you may need to:  Take over-the-counter or prescription medicines.  Eat foods that are high in fiber, such as beans, whole grains, and fresh fruits and vegetables.  Limit foods that are high in fat and processed sugars, such as fried or sweet foods. General instructions  Take over-the-counter and prescription medicines only as told by your health care provider.  Do not take baths, swim, or use a hot tub until your health care provider approves. Shower and wash the perineum gently.  Do not have sex for one week after the treatment, or until your health care provider approves.  Do not use any products that contain nicotine or tobacco, such as cigarettes, e-cigarettes, and chewing tobacco. If you need help quitting, ask your health care provider.  If you have permanent, low-dose brachytherapy implants: ? Limit close contact with children and pregnant women for 2 months or as told by your health care provider. This  is important because of the radiation that is still active in the prostate. ? You may set off radioactive sensors, such as at airport screenings. Ask your health care provider for a document that explains your treatment. ? You may be told to use a condom during sex for the first 2 months after low-dose brachytherapy.  Keep all follow-up visits as told by your health care provider. This is important. You may still need additional treatment. Contact a health care provider if you:  Have a  fever or chills.  Have any of these signs of infection in the treatment area: ? Redness, swelling, or pain. ? Fluid or blood. ? Warmth. ? Pus or a bad smell.  Have no bowel movements for 3-4 days after the procedure.  Have diarrhea for 3-4 days after the procedure.  Develop any new symptoms, such as problems with urinating or erectile dysfunction.  Have pain in your abdomen.  Have more blood in your urine.  Have swelling or pain in your legs. Get help right away if:  You cannot urinate.  You have a lot of bleeding from your rectum.  You have unusual drainage coming from your rectum.  You have severe pain in the treated area that does not go away with pain medicine.  You have severe nausea or vomiting.  You have difficulty breathing. Summary  Talk with your health care provider about your risk of brachytherapy side effects, such as erectile dysfunction or urinary problems.  If you have permanent, low-dose brachytherapy implants, limit close contact with children and pregnant women for 2 months or as told by your health care provider. This is important because of the radiation that is still active in the prostate.  You may be told to use a condom during sex for the first 2 months after low-dose brachytherapy.  Keep all follow-up visits as told by your health care provider. This is important. You may need additional treatment. This information is not intended to replace advice given to you by your health care provider. Make sure you discuss any questions you have with your health care provider. Document Revised: 02/21/2019 Document Reviewed: 02/21/2019 Elsevier Patient Education  2021 [place] Instructions  Activity: Get plenty of rest for the remainder of the day. A responsible individual must stay with you for 24 hours following the procedure.  For the next 24 hours, DO NOT: -Drive a car -Paediatric nurse -Drink alcoholic  beverages -Take any medication unless instructed by your physician -Make any legal decisions or sign important papers.  Meals: Start with liquid foods such as gelatin or soup. Progress to regular foods as tolerated. Avoid greasy, spicy, heavy foods. If nausea and/or vomiting occur, drink only clear liquids until the nausea and/or vomiting subsides. Call your physician if vomiting continues.  Special Instructions/Symptoms: Your throat may feel dry or sore from the anesthesia or the breathing tube placed in your throat during surgery. If this causes discomfort, gargle with warm salt water. The discomfort should disappear within 24 hours.  If you had a scopolamine patch placed behind your ear for the management of post- operative nausea and/or vomiting:  1. The medication in the patch is effective for 72 hours, after which it should be removed.  Wrap patch in a tissue and discard in the trash. Wash hands thoroughly with soap and water. 2. You may remove the patch earlier than 72 hours if you experience unpleasant side effects which may include dry mouth, dizziness  or visual disturbances. 3. Avoid touching the patch. Wash your hands with soap and water after contact with the patch.

## 2020-07-30 ENCOUNTER — Encounter (HOSPITAL_BASED_OUTPATIENT_CLINIC_OR_DEPARTMENT_OTHER): Payer: Self-pay | Admitting: Urology

## 2020-08-09 ENCOUNTER — Other Ambulatory Visit: Payer: Self-pay

## 2020-08-09 ENCOUNTER — Ambulatory Visit (INDEPENDENT_AMBULATORY_CARE_PROVIDER_SITE_OTHER): Payer: Medicare PPO | Admitting: Urology

## 2020-08-09 ENCOUNTER — Encounter: Payer: Self-pay | Admitting: Urology

## 2020-08-09 VITALS — BP 161/103 | HR 86 | Temp 97.8°F | Ht 73.0 in | Wt 197.2 lb

## 2020-08-09 DIAGNOSIS — N201 Calculus of ureter: Secondary | ICD-10-CM | POA: Diagnosis not present

## 2020-08-09 DIAGNOSIS — C61 Malignant neoplasm of prostate: Secondary | ICD-10-CM

## 2020-08-09 DIAGNOSIS — Z87442 Personal history of urinary calculi: Secondary | ICD-10-CM

## 2020-08-09 LAB — URINALYSIS, ROUTINE W REFLEX MICROSCOPIC
Bilirubin, UA: NEGATIVE
Glucose, UA: NEGATIVE
Leukocytes,UA: NEGATIVE
Nitrite, UA: NEGATIVE
Protein,UA: NEGATIVE
Specific Gravity, UA: 1.025 (ref 1.005–1.030)
Urobilinogen, Ur: 0.2 mg/dL (ref 0.2–1.0)
pH, UA: 6 (ref 5.0–7.5)

## 2020-08-09 LAB — MICROSCOPIC EXAMINATION
Bacteria, UA: NONE SEEN
Epithelial Cells (non renal): NONE SEEN /hpf (ref 0–10)
Renal Epithel, UA: NONE SEEN /hpf
WBC, UA: NONE SEEN /hpf (ref 0–5)

## 2020-08-09 NOTE — Progress Notes (Signed)
Urological Symptom Review  Patient is experiencing the following symptoms: Urine stream starts and stops occasionally Weak Stream Erection problems   Review of Systems  Gastrointestinal (upper)  : Negative for upper GI symptoms  Gastrointestinal (lower) : Constipation  Constitutional : Negative for symptoms  Skin: Negative for skin symptoms  Eyes: Negative for eye symptoms  Ear/Nose/Throat : Negative for Ear/Nose/Throat symptoms  Hematologic/Lymphatic: Negative for Hematologic/Lymphatic symptoms  Cardiovascular : Negative for cardiovascular symptoms  Respiratory : Shortness of breath  Endocrine: Negative for endocrine symptoms  Musculoskeletal: Negative for musculoskeletal symptoms  Neurological: Negative for neurological symptoms  Psychologic: Negative for psychiatric symptoms

## 2020-08-09 NOTE — Progress Notes (Signed)
08/09/2020 2:59 PM   Cody Carr 26-Oct-1945 [id number]  Referring provider: Monico Blitz, MD [address] [place],  [place] [address]  followup nephrolithiasis and prostate cancer.    08/09/20: Cody Carr returns today in f/u.  He is s/p a prostate seed implant on 07/27/20.  He is voiding well with an IPSS of 4.   He has a history of stones and had ESWL on 03/06/20 and has had no flank pain or hematuria.      HPI: Cody Carr is a 75yo here for followup for nephrolithiasis. He underwent ESWL on 03/06/20. He passed multiple fragments. No gross hematuria. No flank pain. His KUB on 03/20/20 showed no residual stones.  UA is clear.   He had a partially collapsed lung on the CT for the stone and a f/u Chest CT showed similar findings that are felt to be chronic.  I will send that result to his PCP.   He has prostate cancer on surveillance.  His PSA is up to 7.3.  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 His PSA on 8/6 was 5.9 which is up from about 4.5 on 06/17/19 .  He is voiding well without new complaints and he reports his stream is actually improved. He has no bone pain or weight loss.  His IPSS is 5.   He had an MRIP in 8/21 and had a surveillance biopsy and had 4 cores at the left base and mid prostate with Gleason 6 disease with the highest volume 56% at the left base lateral.  His prostate volume was 52ml.   His prior biopsy had 3 cores in the same area.    PMH: Past Medical History:  Diagnosis Date  . Dyspnea    with exercise  . History of kidney stones   . Hyperlipemia   . Hypertension   . Prostate cancer ([place])   . 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 dental implants  2008  . EXTRACORPOREAL SHOCK WAVE LITHOTRIPSY Right 03/06/2020   Procedure: EXTRACORPOREAL SHOCK WAVE LITHOTRIPSY (ESWL);  Surgeon: Cleon Gustin, MD;  Location: [facility];  Service: Urology;  Laterality: Right;  . EYE SURGERY Bilateral 2021   ioc lens for cataracts  . RADIOACTIVE SEED IMPLANT N/A 07/27/2020   Procedure: RADIOACTIVE SEED IMPLANT/BRACHYTHERAPY IMPLANT;  Surgeon: Irine Seal, MD;  Location: [facility];  Service: Urology;  Laterality: N/A;  63 SEEDS  . SPACE OAR INSTILLATION N/A 07/27/2020   Procedure: SPACE OAR INSTILLATION;  Surgeon: Irine Seal, MD;  Location: [facility];  Service: Urology;  Laterality: N/A;  . throat biopsy  2008    Home Medications:  Allergies as of 08/09/2020   No Known Allergies     Medication List       Accurate as of August 09, 2020  2:59 PM. If you have any questions, ask your nurse or doctor.        Biotin 5000 MCG Caps Take 5,000 mcg by mouth daily.   HYDROcodone-acetaminophen 5-325 MG tablet Commonly known as: NORCO/VICODIN Take 1 tablet by mouth every 6 (six) hours as needed for moderate pain.   lisinopril 2.5 MG tablet Commonly known as: ZESTRIL Take 2.5 mg by mouth every evening.   multivitamin with minerals Tabs tablet Take 1 tablet by  mouth daily.   naproxen sodium 220 MG tablet Commonly known as: ALEVE Take 220-440 mg by mouth daily as needed (pain).   rosuvastatin 10 MG tablet Commonly known as: CRESTOR Take 5 mg by mouth every evening.   Vitamin B-12 5000 MCG Subl Take 5,000 mcg by mouth daily.   Vitamin D3 50 MCG (2000 UT) Tabs Take 2,000 Units by mouth daily.       Allergies: No Known Allergies  Family History: Family History  Problem Relation Age of Onset  . Prostate cancer Neg Hx   . Breast cancer Neg Hx   . Colon cancer Neg Hx   . Pancreatic cancer Neg Hx     Social History:  reports that he quit smoking about 42 years ago. His smoking use included cigarettes. He has a 6.00 pack-year smoking history. He has never used smokeless tobacco. He reports previous alcohol use. He reports that he does not use  drugs.  ROS: All other review of systems were reviewed and are negative except what is noted above in HPI  Physical Exam: BP (!) 161/103   Pulse 86   Temp 97.8 F (36.6 C) (Oral)   Ht 6\' 1"  (1.854 m)   Wt 197 lb 3.2 oz (89.4 kg)   BMI 26.02 kg/m   Gen WD,WN NAD A/O x 3. Lungs CTA CV RRR without murmur.   Laboratory Data: Lab Results  Component Value Date   WBC 10.2 07/25/2020   HGB 12.6 (L) 07/25/2020   HCT 40.4 07/25/2020   MCV 88.8 07/25/2020   PLT 332 07/25/2020    Lab Results  Component Value Date   CREATININE 0.90 07/25/2020    Lab Results  Component Value Date   PSA 5.9 (H) 11/25/2019   PSA 4.4 (H) 06/17/2019   PSA on 04/06/20 was 7.3.  No results found for: TESTOSTERONE  No results found for: HGBA1C  Urinalysis    Component Value Date/Time   APPEARANCEUR Clear 07/12/2020 1425   GLUCOSEU Negative 07/12/2020 1425   BILIRUBINUR Negative 07/12/2020 1425   PROTEINUR Negative 07/12/2020 1425   NITRITE Negative 07/12/2020 1425   LEUKOCYTESUR Negative 07/12/2020 1425    Lab Results  Component Value Date   LABMICR Comment 07/12/2020   WBCUA None seen 02/24/2020   LABEPIT 0-10 02/24/2020   MUCUS Present 12/23/2019   BACTERIA Few 02/24/2020   UA has 0-2 RBC's.   Assessment & Plan:    Prostate cancer.   He is doing very well following a seed implant and SpaceOAR.   I will have him return in 3 months with a PSA.  History of stones.  I will get a KUB prior to f/u.     Irine Seal, MD  [facility] Patient ID: Cody Carr, male   DOB: 02-19-1946, 75 y.o.   MRN: [id number]

## 2020-08-23 ENCOUNTER — Telehealth: Payer: Self-pay | Admitting: *Deleted

## 2020-08-23 NOTE — Progress Notes (Signed)
  Radiation Oncology         [phone] ________________________________  Name: Cody Carr MRN: [id number]  Date: 08/24/2020  DOB: [birth year]  COMPLEX SIMULATION NOTE  NARRATIVE:  The patient was brought to the [facility] suite today following prostate seed implantation approximately one month ago.  Identity was confirmed.  All relevant records and images related to the planned course of therapy were reviewed.  Then, the patient was set-up supine.  CT images were obtained.  The CT images were loaded into the planning software.  Then the prostate and rectum were contoured.  Treatment planning then occurred.  The implanted iodine 125 seeds were identified by the physics staff for projection of radiation distribution  I have requested : 3D Simulation  I have requested a DVH of the following structures: Prostate and rectum.    ________________________________  Sheral Apley Tammi Klippel, M.D.

## 2020-08-23 NOTE — Telephone Encounter (Signed)
CALLED PATIENT TO REMIND OF POST SEED APPTS. FOR 08-24-20, SPOKE WITH PATIENT AND HE IS AWARE OF THESE APPTS.

## 2020-08-24 ENCOUNTER — Other Ambulatory Visit: Payer: Self-pay

## 2020-08-24 ENCOUNTER — Encounter: Payer: Self-pay | Admitting: Urology

## 2020-08-24 ENCOUNTER — Ambulatory Visit
Admission: RE | Admit: 2020-08-24 | Discharge: 2020-08-24 | Disposition: A | Discharge status: Home / Self Care | Payer: Medicare PPO | Source: Ambulatory Visit | Attending: Radiation Oncology | Admitting: Radiation Oncology

## 2020-08-24 ENCOUNTER — Ambulatory Visit
Admission: RE | Admit: 2020-08-24 | Discharge: 2020-08-24 | Disposition: A | Discharge status: Home / Self Care | Payer: Medicare PPO | Source: Ambulatory Visit | Attending: Urology | Admitting: Urology

## 2020-08-24 VITALS — BP 147/77 | HR 79 | Temp 97.7°F | Resp 20 | Ht 73.0 in | Wt 193.0 lb

## 2020-08-24 DIAGNOSIS — C61 Malignant neoplasm of prostate: Secondary | ICD-10-CM | POA: Diagnosis not present

## 2020-08-24 NOTE — Progress Notes (Signed)
Radiation Oncology         [phone] ________________________________  Name: Cody Carr MRN: [id number]  Date: 08/24/2020  DOB: 26-Jul-1945  Post-Seed Follow-Up Visit Note  CC: Monico Blitz, MD  Irine Seal, MD  Diagnosis:   75 y.o. gentleman with Stage T1c adenocarcinoma of the prostate with Gleason score of 3+3, and PSA of 7.3.    ICD-10-CM   1. Prostate cancer ([place])  C61     Interval Since Last Radiation:  4 weeks 07/27/20:  Insertion of radioactive I-125 seeds into the prostate gland; 145 Gy, definitive therapy with placement of SpaceOAR gel.  Narrative:  The patient returns today for routine follow-up.  He is complaining of increased urinary frequency and urinary hesitation symptoms. He filled out a questionnaire regarding urinary function today providing and overall IPSS score of 5 characterizing his symptoms as mild with weak stream and intermittency. His pre-implant score was 6. He specifically denies dysuria, gross hematuria, straining to void, excessive daytime frequency, urgency, incomplete emptying or incontinence. He denies any abdominal pain or bowel symptoms and has not noted any significant impact on his stamina/energy level. He is very pleased with his progress to date.  ALLERGIES:  has No Known Allergies.  Meds: Current Outpatient Medications  Medication Sig Dispense Refill  . Biotin 5000 MCG CAPS Take 5,000 mcg by mouth daily.    . Cholecalciferol (VITAMIN D3) 50 MCG (2000 UT) TABS Take 2,000 Units by mouth daily.    . Cyanocobalamin (VITAMIN B-12) 5000 MCG SUBL Take 5,000 mcg by mouth daily.    Marland Kitchen lisinopril (ZESTRIL) 2.5 MG tablet Take 2.5 mg by mouth every evening.     . Multiple Vitamin (MULTIVITAMIN WITH MINERALS) TABS tablet Take 1 tablet by mouth daily.    . rosuvastatin (CRESTOR) 10 MG tablet Take 5 mg by mouth every evening.    Marland Kitchen HYDROcodone-acetaminophen (NORCO/VICODIN) 5-325 MG tablet Take 1 tablet by mouth every 6 (six) hours as needed for  moderate pain. (Patient not taking: Reported on 08/24/2020) 6 tablet 0  . naproxen sodium (ALEVE) 220 MG tablet Take 220-440 mg by mouth daily as needed (pain). (Patient not taking: Reported on 08/24/2020)     No current facility-administered medications for this encounter.    Physical Findings: In general this is a well appearing Caucasian male in no acute distress. He's alert and oriented x4 and appropriate throughout the examination. Cardiopulmonary assessment is negative for acute distress and he exhibits normal effort.   Lab Findings: Lab Results  Component Value Date   WBC 10.2 07/25/2020   HGB 12.6 (L) 07/25/2020   HCT 40.4 07/25/2020   MCV 88.8 07/25/2020   PLT 332 07/25/2020    Radiographic Findings:  Patient underwent CT imaging in our clinic for post implant dosimetry. The CT will be reviewed by Dr. Tammi Klippel to confirm there is an adequate distribution of radioactive seeds throughout the prostate gland and ensure that there are no seeds in or near the rectum. We suspect the final radiation plan and dosimetry will show appropriate coverage of the prostate gland. He understands that we will call and inform him of any unexpected findings on further review of his imaging and dosimetry.  Impression/Plan: 75 y.o. gentleman with Stage T1c adenocarcinoma of the prostate with Gleason score of 3+3, and PSA of 7.3. The patient is recovering from the effects of radiation. His urinary symptoms should gradually improve over the next 4-6 months. We talked about this today. He is encouraged by his improvement  already and is otherwise pleased with his outcome. We also talked about long-term follow-up for prostate cancer following seed implant. He understands that ongoing PSA determinations and digital rectal exams will help perform surveillance to rule out disease recurrence. He saw Dr. Jeffie Pollock on 08/09/20 and has his next follow up appointment scheduled for labs on 10/31/20 and will see Dr. Jeffie Pollock on  11/08/20. He understands what to expect with his PSA measures. Patient was also educated today about some of the long-term effects from radiation including a small risk for rectal bleeding and possibly erectile dysfunction. We talked about some of the general management approaches to these potential complications. However, I did encourage the patient to contact our office or return at any point if he has questions or concerns related to his previous radiation and prostate cancer.    Nicholos Johns, PA-C

## 2020-09-07 DIAGNOSIS — Z299 Encounter for prophylactic measures, unspecified: Secondary | ICD-10-CM | POA: Diagnosis not present

## 2020-09-07 DIAGNOSIS — I700 Atherosclerosis of aorta: Secondary | ICD-10-CM | POA: Diagnosis not present

## 2020-09-07 DIAGNOSIS — I10 Essential (primary) hypertension: Secondary | ICD-10-CM | POA: Diagnosis not present

## 2020-09-07 DIAGNOSIS — C61 Malignant neoplasm of prostate: Secondary | ICD-10-CM | POA: Diagnosis not present

## 2020-09-07 DIAGNOSIS — J849 Interstitial pulmonary disease, unspecified: Secondary | ICD-10-CM | POA: Diagnosis not present

## 2020-09-21 ENCOUNTER — Encounter: Payer: Self-pay | Admitting: Radiation Oncology

## 2020-09-21 ENCOUNTER — Ambulatory Visit
Admission: RE | Admit: 2020-09-21 | Discharge: 2020-09-21 | Disposition: A | Discharge status: Home / Self Care | Payer: Medicare PPO | Source: Ambulatory Visit | Attending: Radiation Oncology | Admitting: Radiation Oncology

## 2020-09-21 DIAGNOSIS — C61 Malignant neoplasm of prostate: Secondary | ICD-10-CM | POA: Diagnosis not present

## 2020-09-24 NOTE — Progress Notes (Signed)
  Radiation Oncology         ([phone]) [phone] ________________________________  Name: Cody Carr MRN: [id number]  Date: 09/21/2020  DOB: 02/23/1946  3D Planning Note   Prostate Brachytherapy Post-Implant Dosimetry  Diagnosis: 75 y.o. gentleman with Stage T1c adenocarcinoma of the prostate with Gleason score of 3+3, and PSA of 7.3  Narrative: On a previous date, Cody Carr returned following prostate seed implantation for post implant planning. He underwent CT scan complex simulation to delineate the three-dimensional structures of the pelvis and demonstrate the radiation distribution.  Since that time, the seed localization, and complex isodose planning with dose volume histograms have now been completed.  Results:   Prostate Coverage - The dose of radiation delivered to the 90% or more of the prostate gland (D90) was 98.37% of the prescription dose. This exceeds our goal of greater than 90%. Rectal Sparing - The volume of rectal tissue receiving the prescription dose or higher was 0.0 cc. This falls under our thresholds tolerance of 1.0 cc.  Impression: The prostate seed implant appears to show adequate target coverage and appropriate rectal sparing.  Plan:  The patient will continue to follow with urology for ongoing PSA determinations. I would anticipate a high likelihood for local tumor control with minimal risk for rectal morbidity.  ________________________________  Sheral Apley Tammi Klippel, M.D.

## 2020-10-03 ENCOUNTER — Other Ambulatory Visit (HOSPITAL_COMMUNITY): Payer: Self-pay | Admitting: Urology

## 2020-10-03 DIAGNOSIS — R918 Other nonspecific abnormal finding of lung field: Secondary | ICD-10-CM

## 2020-10-31 ENCOUNTER — Other Ambulatory Visit: Payer: Self-pay

## 2020-10-31 ENCOUNTER — Other Ambulatory Visit: Payer: Medicare PPO

## 2020-10-31 DIAGNOSIS — R918 Other nonspecific abnormal finding of lung field: Secondary | ICD-10-CM

## 2020-10-31 DIAGNOSIS — C61 Malignant neoplasm of prostate: Secondary | ICD-10-CM | POA: Diagnosis not present

## 2020-11-01 ENCOUNTER — Ambulatory Visit (HOSPITAL_COMMUNITY)
Admission: RE | Admit: 2020-11-01 | Discharge: 2020-11-01 | Disposition: A | Discharge status: Home / Self Care | Payer: Medicare PPO | Source: Ambulatory Visit | Attending: Urology | Admitting: Urology

## 2020-11-01 DIAGNOSIS — J90 Pleural effusion, not elsewhere classified: Secondary | ICD-10-CM | POA: Diagnosis not present

## 2020-11-01 DIAGNOSIS — J849 Interstitial pulmonary disease, unspecified: Secondary | ICD-10-CM | POA: Diagnosis not present

## 2020-11-01 DIAGNOSIS — R918 Other nonspecific abnormal finding of lung field: Secondary | ICD-10-CM | POA: Diagnosis not present

## 2020-11-01 DIAGNOSIS — I700 Atherosclerosis of aorta: Secondary | ICD-10-CM | POA: Diagnosis not present

## 2020-11-01 LAB — PSA: Prostate Specific Ag, Serum: 2.6 ng/mL (ref 0.0–4.0)

## 2020-11-01 IMAGING — CT CT CHEST W/O CM
2 of 4 series · 15 of 36 positions shown, 18 images · non-contrast
Comparison: Chest CT [DATE]

CLINICAL DATA: Lung mass.

EXAM:
CT CHEST WITHOUT CONTRAST
TECHNIQUE: Multidetector CT imaging of the chest was performed following the
standard protocol without IV contrast.

[Series 2: routine chest without · axial · non-contrast · 0.80mm/px · z∈[-415,-129]mm · 12 of 169 slices shown, 15 images]
[im 13/169  mediastinal]
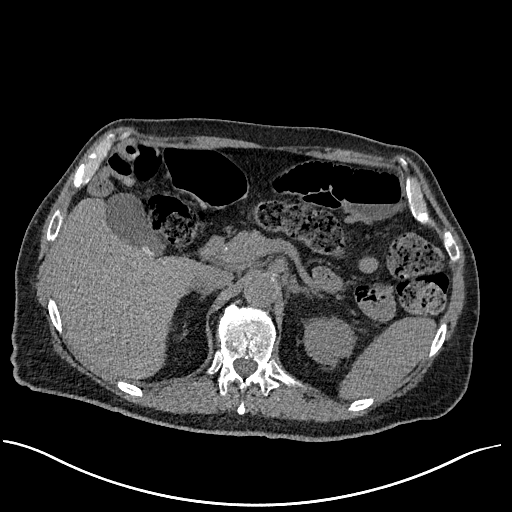
[im 13/169  lung]
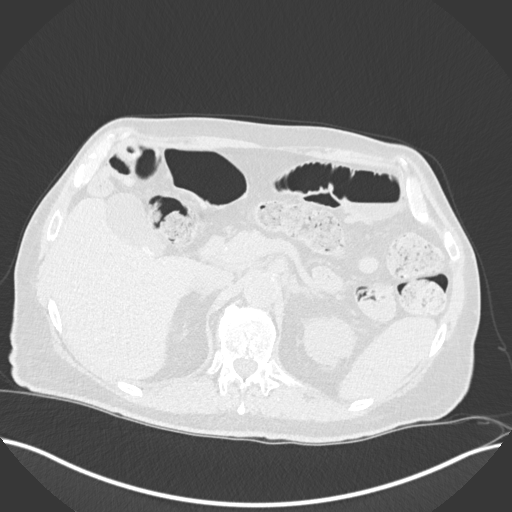
[im 26/169  lung]
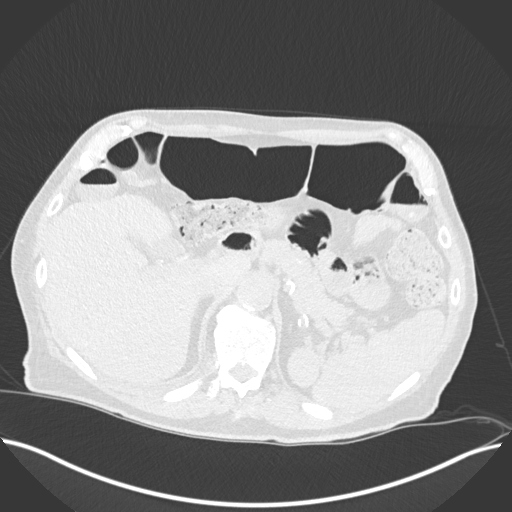
[im 39/169  lung]
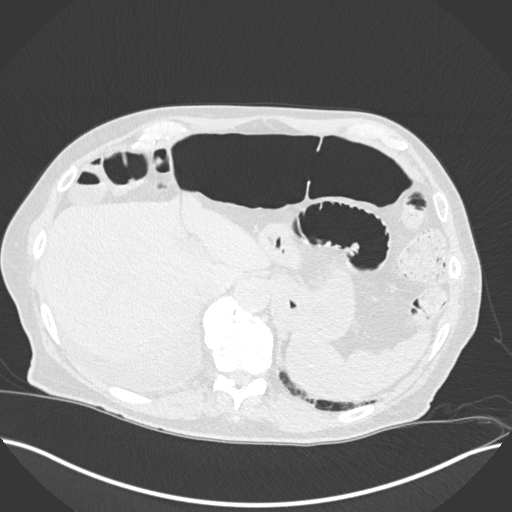
[im 52/169  lung]
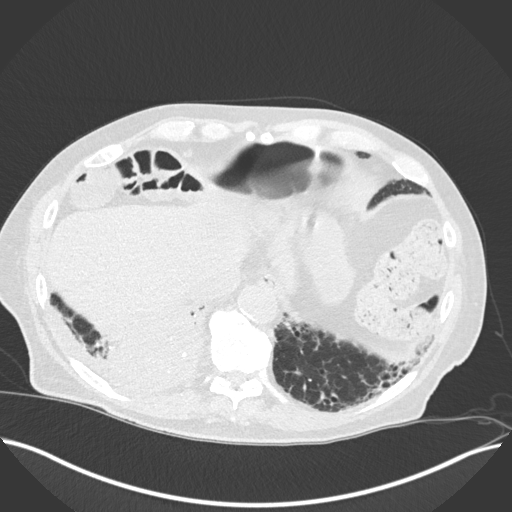
[im 65/169  mediastinal]
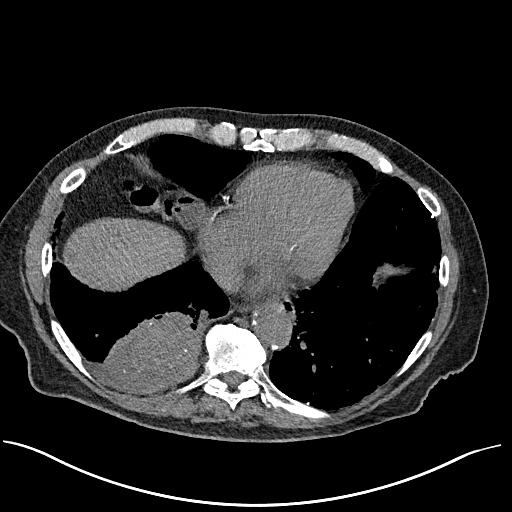
[im 65/169  lung]
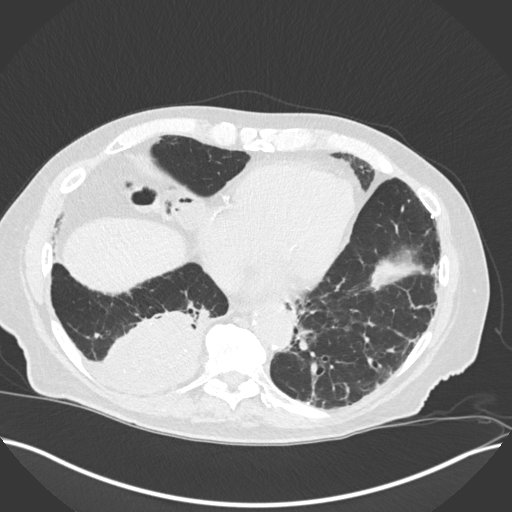
[im 78/169  lung]
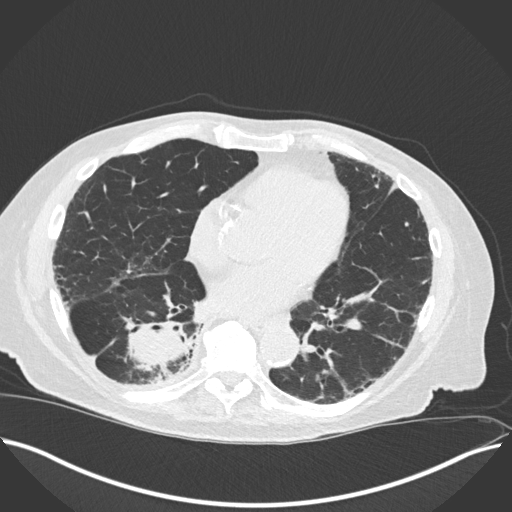
[im 91/169  lung]
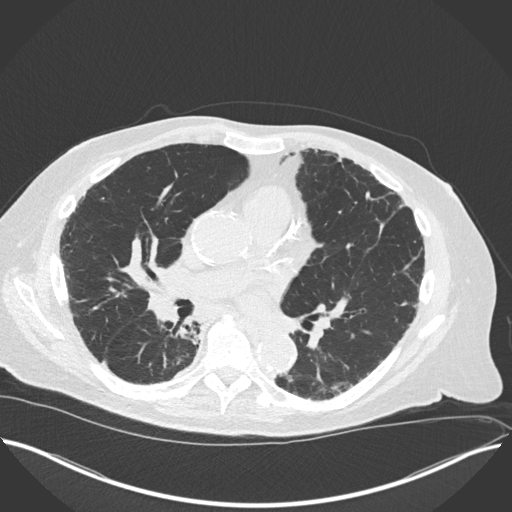
[im 104/169  lung]
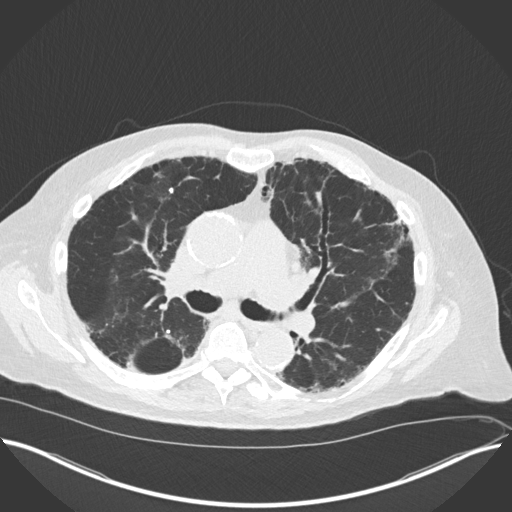
[im 117/169  mediastinal]
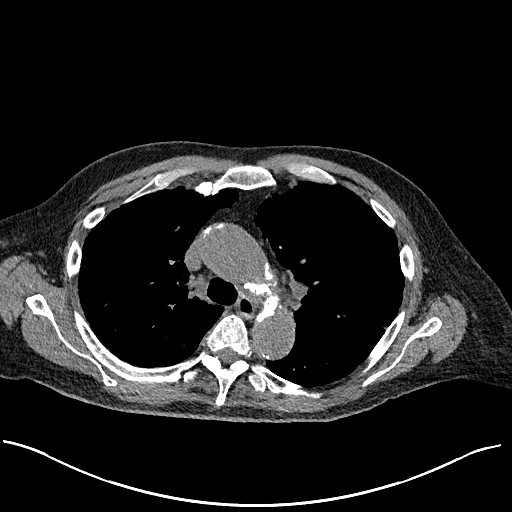
[im 117/169  lung]
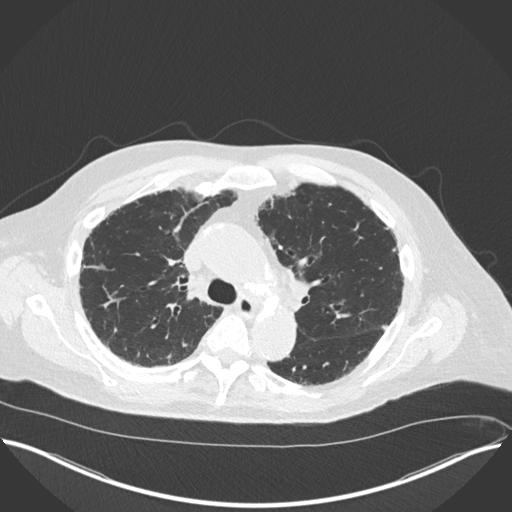
[im 130/169  lung]
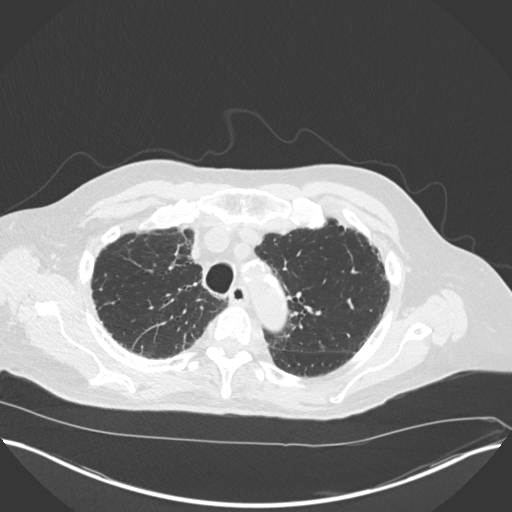
[im 143/169  lung]
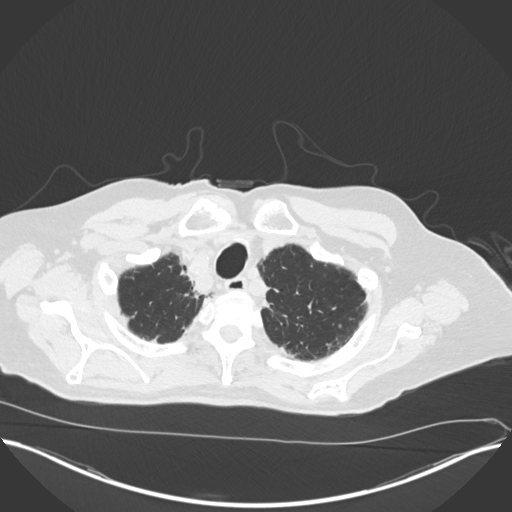
[im 156/169  lung]
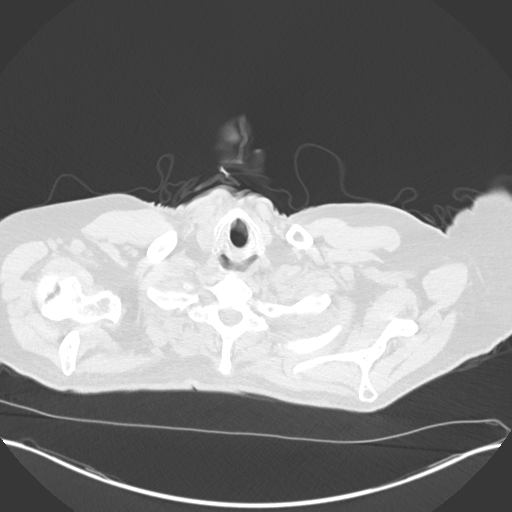

[Series 5: coronal · coronal · 0.70mm/px · 3 of 149 slices shown]
[im 30/149  lung]
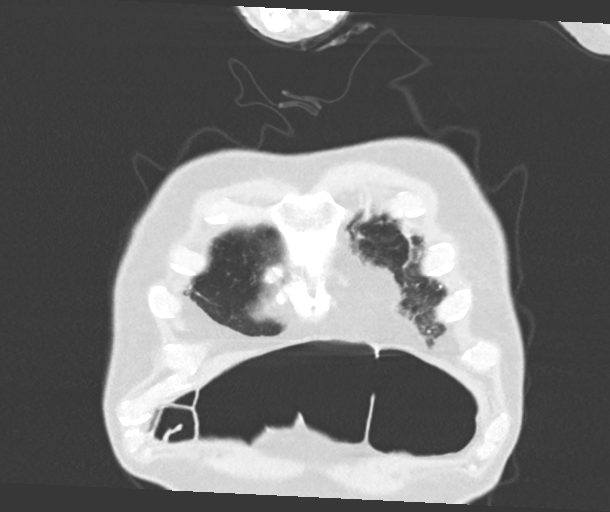
[im 60/149  lung]
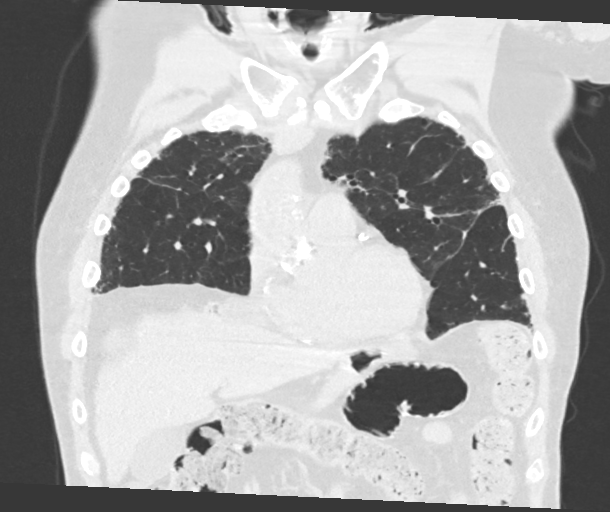
[im 89/149  lung]
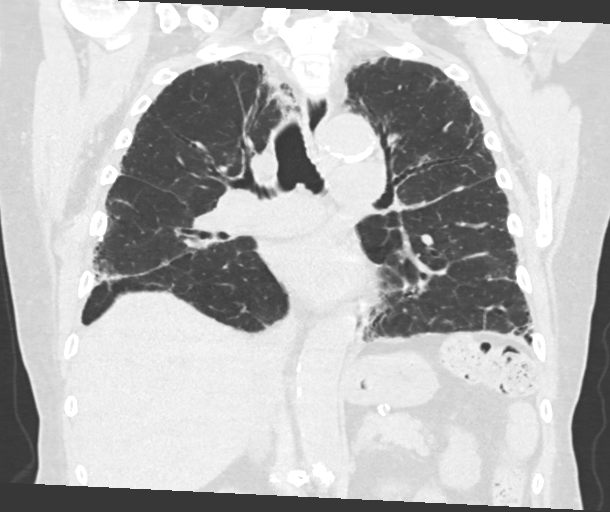

[15 of 36 positions shown; findings below may reference images not displayed]

FINDINGS: Cardiovascular: The heart size is normal. No substantial pericardial
effusion. Coronary artery calcification is evident. Atherosclerotic
calcification is noted in the wall of the thoracic aorta.

Mediastinum/Nodes: Similar appearance 16 mm short axis subcarinal
node. Hilar regions not well evaluated due to lack of intravenous
contrast material. The esophagus has normal imaging features. There
is no axillary lymphadenopathy.

Lungs/Pleura: Subpleural reticulation and chronic interstitial
changes again noted bilaterally. Process in the right lower lobe has
now become more masslike and is progressive measuring 8.5 x 5.4 cm
today compared to 7.3 x 3.9 cm previously. Soft tissue windows show
numerous tiny calcifications distributed through this process. Tiny
right pleural effusion associated.

Upper Abdomen: Unremarkable

Musculoskeletal: No worrisome lytic or sclerotic osseous
abnormality.
IMPRESSION: 1. Interval progression of the masslike consolidative opacity in the
right lower lobe with numerous tiny calcifications distributed
throughout. Imaging features remain nonspecific and may reflect
chronic infectious/inflammatory etiology. Given continued
progression, neoplasm is not excluded.
2. Similar appearance of mild subcarinal lymphadenopathy.
3. Chronic interstitial lung disease.
4. Aortic Atherosclerosis ([Q4]-[Q4]).

## 2020-11-08 ENCOUNTER — Ambulatory Visit: Payer: Medicare PPO | Admitting: Urology

## 2020-12-24 DIAGNOSIS — Z79899 Other long term (current) drug therapy: Secondary | ICD-10-CM | POA: Diagnosis not present

## 2020-12-24 DIAGNOSIS — Z1339 Encounter for screening examination for other mental health and behavioral disorders: Secondary | ICD-10-CM | POA: Diagnosis not present

## 2020-12-24 DIAGNOSIS — R5383 Other fatigue: Secondary | ICD-10-CM | POA: Diagnosis not present

## 2020-12-24 DIAGNOSIS — C61 Malignant neoplasm of prostate: Secondary | ICD-10-CM | POA: Diagnosis not present

## 2020-12-24 DIAGNOSIS — M549 Dorsalgia, unspecified: Secondary | ICD-10-CM | POA: Diagnosis not present

## 2020-12-24 DIAGNOSIS — M545 Low back pain, unspecified: Secondary | ICD-10-CM | POA: Diagnosis not present

## 2020-12-24 DIAGNOSIS — C140 Malignant neoplasm of pharynx, unspecified: Secondary | ICD-10-CM | POA: Diagnosis not present

## 2020-12-24 DIAGNOSIS — I10 Essential (primary) hypertension: Secondary | ICD-10-CM | POA: Diagnosis not present

## 2020-12-24 DIAGNOSIS — Z1331 Encounter for screening for depression: Secondary | ICD-10-CM | POA: Diagnosis not present

## 2020-12-24 DIAGNOSIS — Z0000 Encounter for general adult medical examination without abnormal findings: Secondary | ICD-10-CM | POA: Diagnosis not present

## 2020-12-24 DIAGNOSIS — Z9181 History of falling: Secondary | ICD-10-CM | POA: Diagnosis not present

## 2020-12-24 DIAGNOSIS — E661 Drug-induced obesity: Secondary | ICD-10-CM | POA: Diagnosis not present

## 2020-12-24 DIAGNOSIS — Z7189 Other specified counseling: Secondary | ICD-10-CM | POA: Diagnosis not present

## 2020-12-28 DIAGNOSIS — C61 Malignant neoplasm of prostate: Secondary | ICD-10-CM | POA: Diagnosis not present

## 2020-12-28 DIAGNOSIS — R0689 Other abnormalities of breathing: Secondary | ICD-10-CM | POA: Diagnosis not present

## 2020-12-28 DIAGNOSIS — E870 Hyperosmolality and hypernatremia: Secondary | ICD-10-CM | POA: Diagnosis not present

## 2020-12-28 DIAGNOSIS — M545 Low back pain, unspecified: Secondary | ICD-10-CM | POA: Diagnosis not present

## 2020-12-28 DIAGNOSIS — R109 Unspecified abdominal pain: Secondary | ICD-10-CM | POA: Diagnosis not present

## 2020-12-28 DIAGNOSIS — Z20822 Contact with and (suspected) exposure to covid-19: Secondary | ICD-10-CM | POA: Diagnosis not present

## 2020-12-28 DIAGNOSIS — E876 Hypokalemia: Secondary | ICD-10-CM | POA: Diagnosis not present

## 2020-12-28 DIAGNOSIS — D72829 Elevated white blood cell count, unspecified: Secondary | ICD-10-CM | POA: Diagnosis not present

## 2020-12-28 DIAGNOSIS — K5900 Constipation, unspecified: Secondary | ICD-10-CM | POA: Diagnosis not present

## 2020-12-28 DIAGNOSIS — I10 Essential (primary) hypertension: Secondary | ICD-10-CM | POA: Diagnosis not present

## 2020-12-28 DIAGNOSIS — R197 Diarrhea, unspecified: Secondary | ICD-10-CM | POA: Diagnosis not present

## 2020-12-28 DIAGNOSIS — K625 Hemorrhage of anus and rectum: Secondary | ICD-10-CM | POA: Diagnosis not present

## 2020-12-28 DIAGNOSIS — M549 Dorsalgia, unspecified: Secondary | ICD-10-CM | POA: Diagnosis not present

## 2020-12-29 DIAGNOSIS — Z79899 Other long term (current) drug therapy: Secondary | ICD-10-CM | POA: Diagnosis not present

## 2020-12-29 DIAGNOSIS — E876 Hypokalemia: Secondary | ICD-10-CM | POA: Diagnosis not present

## 2020-12-29 DIAGNOSIS — I10 Essential (primary) hypertension: Secondary | ICD-10-CM | POA: Diagnosis not present

## 2020-12-29 DIAGNOSIS — C9110 Chronic lymphocytic leukemia of B-cell type not having achieved remission: Secondary | ICD-10-CM | POA: Diagnosis not present

## 2020-12-29 DIAGNOSIS — E870 Hyperosmolality and hypernatremia: Secondary | ICD-10-CM | POA: Diagnosis not present

## 2020-12-29 DIAGNOSIS — Z8546 Personal history of malignant neoplasm of prostate: Secondary | ICD-10-CM | POA: Diagnosis not present

## 2020-12-29 DIAGNOSIS — M545 Low back pain, unspecified: Secondary | ICD-10-CM | POA: Diagnosis not present

## 2020-12-29 DIAGNOSIS — K5900 Constipation, unspecified: Secondary | ICD-10-CM | POA: Diagnosis not present

## 2020-12-30 DIAGNOSIS — K5900 Constipation, unspecified: Secondary | ICD-10-CM | POA: Diagnosis not present

## 2020-12-30 DIAGNOSIS — D72829 Elevated white blood cell count, unspecified: Secondary | ICD-10-CM | POA: Diagnosis not present

## 2020-12-30 DIAGNOSIS — C61 Malignant neoplasm of prostate: Secondary | ICD-10-CM | POA: Diagnosis not present

## 2020-12-30 DIAGNOSIS — E876 Hypokalemia: Secondary | ICD-10-CM | POA: Diagnosis not present

## 2020-12-30 DIAGNOSIS — I10 Essential (primary) hypertension: Secondary | ICD-10-CM | POA: Diagnosis not present

## 2020-12-30 DIAGNOSIS — M549 Dorsalgia, unspecified: Secondary | ICD-10-CM | POA: Diagnosis not present

## 2020-12-31 DIAGNOSIS — M549 Dorsalgia, unspecified: Secondary | ICD-10-CM | POA: Diagnosis not present

## 2020-12-31 DIAGNOSIS — I10 Essential (primary) hypertension: Secondary | ICD-10-CM | POA: Diagnosis not present

## 2020-12-31 DIAGNOSIS — Z299 Encounter for prophylactic measures, unspecified: Secondary | ICD-10-CM | POA: Diagnosis not present

## 2020-12-31 DIAGNOSIS — D72829 Elevated white blood cell count, unspecified: Secondary | ICD-10-CM | POA: Diagnosis not present

## 2020-12-31 DIAGNOSIS — R42 Dizziness and giddiness: Secondary | ICD-10-CM | POA: Diagnosis not present

## 2021-01-03 DIAGNOSIS — K5903 Drug induced constipation: Secondary | ICD-10-CM | POA: Diagnosis not present

## 2021-01-03 DIAGNOSIS — C7801 Secondary malignant neoplasm of right lung: Secondary | ICD-10-CM | POA: Diagnosis not present

## 2021-01-03 DIAGNOSIS — I10 Essential (primary) hypertension: Secondary | ICD-10-CM | POA: Diagnosis not present

## 2021-01-03 DIAGNOSIS — D638 Anemia in other chronic diseases classified elsewhere: Secondary | ICD-10-CM | POA: Diagnosis not present

## 2021-01-03 DIAGNOSIS — T402X5D Adverse effect of other opioids, subsequent encounter: Secondary | ICD-10-CM | POA: Diagnosis not present

## 2021-01-03 DIAGNOSIS — E785 Hyperlipidemia, unspecified: Secondary | ICD-10-CM | POA: Diagnosis not present

## 2021-01-03 DIAGNOSIS — D72829 Elevated white blood cell count, unspecified: Secondary | ICD-10-CM | POA: Diagnosis not present

## 2021-01-03 DIAGNOSIS — E876 Hypokalemia: Secondary | ICD-10-CM | POA: Diagnosis not present

## 2021-01-03 DIAGNOSIS — M549 Dorsalgia, unspecified: Secondary | ICD-10-CM | POA: Diagnosis not present

## 2021-01-04 DIAGNOSIS — Z299 Encounter for prophylactic measures, unspecified: Secondary | ICD-10-CM | POA: Diagnosis not present

## 2021-01-04 DIAGNOSIS — M549 Dorsalgia, unspecified: Secondary | ICD-10-CM | POA: Diagnosis not present

## 2021-01-04 DIAGNOSIS — K5900 Constipation, unspecified: Secondary | ICD-10-CM | POA: Diagnosis not present

## 2021-01-08 ENCOUNTER — Emergency Department (HOSPITAL_COMMUNITY): Payer: Medicare PPO

## 2021-01-08 ENCOUNTER — Encounter (HOSPITAL_COMMUNITY): Payer: Self-pay

## 2021-01-08 ENCOUNTER — Other Ambulatory Visit: Payer: Self-pay

## 2021-01-08 ENCOUNTER — Inpatient Hospital Stay (HOSPITAL_COMMUNITY)
Admission: EM | Admit: 2021-01-08 | Discharge: 2021-01-24 | DRG: 180 | Disposition: A | Discharge status: Hospice | Payer: Medicare PPO | Attending: Internal Medicine | Admitting: Internal Medicine

## 2021-01-08 DIAGNOSIS — Z7401 Bed confinement status: Secondary | ICD-10-CM

## 2021-01-08 DIAGNOSIS — Z515 Encounter for palliative care: Secondary | ICD-10-CM

## 2021-01-08 DIAGNOSIS — M549 Dorsalgia, unspecified: Secondary | ICD-10-CM

## 2021-01-08 DIAGNOSIS — C799 Secondary malignant neoplasm of unspecified site: Secondary | ICD-10-CM | POA: Diagnosis present

## 2021-01-08 DIAGNOSIS — Z6821 Body mass index (BMI) 21.0-21.9, adult: Secondary | ICD-10-CM

## 2021-01-08 DIAGNOSIS — C3431 Malignant neoplasm of lower lobe, right bronchus or lung: Secondary | ICD-10-CM | POA: Diagnosis not present

## 2021-01-08 DIAGNOSIS — I10 Essential (primary) hypertension: Secondary | ICD-10-CM | POA: Diagnosis present

## 2021-01-08 DIAGNOSIS — K7689 Other specified diseases of liver: Secondary | ICD-10-CM | POA: Diagnosis not present

## 2021-01-08 DIAGNOSIS — E876 Hypokalemia: Secondary | ICD-10-CM | POA: Diagnosis not present

## 2021-01-08 DIAGNOSIS — C7951 Secondary malignant neoplasm of bone: Secondary | ICD-10-CM | POA: Diagnosis present

## 2021-01-08 DIAGNOSIS — K5900 Constipation, unspecified: Secondary | ICD-10-CM | POA: Diagnosis present

## 2021-01-08 DIAGNOSIS — C3491 Malignant neoplasm of unspecified part of right bronchus or lung: Secondary | ICD-10-CM | POA: Diagnosis present

## 2021-01-08 DIAGNOSIS — Z961 Presence of intraocular lens: Secondary | ICD-10-CM | POA: Diagnosis present

## 2021-01-08 DIAGNOSIS — E785 Hyperlipidemia, unspecified: Secondary | ICD-10-CM | POA: Diagnosis present

## 2021-01-08 DIAGNOSIS — N281 Cyst of kidney, acquired: Secondary | ICD-10-CM | POA: Diagnosis not present

## 2021-01-08 DIAGNOSIS — D638 Anemia in other chronic diseases classified elsewhere: Secondary | ICD-10-CM | POA: Diagnosis present

## 2021-01-08 DIAGNOSIS — E440 Moderate protein-calorie malnutrition: Secondary | ICD-10-CM | POA: Diagnosis present

## 2021-01-08 DIAGNOSIS — E43 Unspecified severe protein-calorie malnutrition: Secondary | ICD-10-CM | POA: Diagnosis present

## 2021-01-08 DIAGNOSIS — Z923 Personal history of irradiation: Secondary | ICD-10-CM

## 2021-01-08 DIAGNOSIS — Z299 Encounter for prophylactic measures, unspecified: Secondary | ICD-10-CM | POA: Diagnosis not present

## 2021-01-08 DIAGNOSIS — D72829 Elevated white blood cell count, unspecified: Secondary | ICD-10-CM | POA: Diagnosis not present

## 2021-01-08 DIAGNOSIS — Z66 Do not resuscitate: Secondary | ICD-10-CM | POA: Diagnosis not present

## 2021-01-08 DIAGNOSIS — R490 Dysphonia: Secondary | ICD-10-CM | POA: Diagnosis present

## 2021-01-08 DIAGNOSIS — M48061 Spinal stenosis, lumbar region without neurogenic claudication: Secondary | ICD-10-CM | POA: Diagnosis present

## 2021-01-08 DIAGNOSIS — T402X5A Adverse effect of other opioids, initial encounter: Secondary | ICD-10-CM | POA: Diagnosis present

## 2021-01-08 DIAGNOSIS — N200 Calculus of kidney: Secondary | ICD-10-CM | POA: Diagnosis not present

## 2021-01-08 DIAGNOSIS — K5903 Drug induced constipation: Secondary | ICD-10-CM | POA: Diagnosis not present

## 2021-01-08 DIAGNOSIS — Z85810 Personal history of malignant neoplasm of tongue: Secondary | ICD-10-CM

## 2021-01-08 DIAGNOSIS — C7801 Secondary malignant neoplasm of right lung: Secondary | ICD-10-CM | POA: Diagnosis not present

## 2021-01-08 DIAGNOSIS — L8990 Pressure ulcer of unspecified site, unspecified stage: Secondary | ICD-10-CM | POA: Diagnosis present

## 2021-01-08 DIAGNOSIS — L89152 Pressure ulcer of sacral region, stage 2: Secondary | ICD-10-CM | POA: Diagnosis present

## 2021-01-08 DIAGNOSIS — C61 Malignant neoplasm of prostate: Secondary | ICD-10-CM | POA: Diagnosis present

## 2021-01-08 DIAGNOSIS — Z9221 Personal history of antineoplastic chemotherapy: Secondary | ICD-10-CM

## 2021-01-08 DIAGNOSIS — J9601 Acute respiratory failure with hypoxia: Secondary | ICD-10-CM | POA: Diagnosis not present

## 2021-01-08 DIAGNOSIS — K222 Esophageal obstruction: Secondary | ICD-10-CM

## 2021-01-08 DIAGNOSIS — M4726 Other spondylosis with radiculopathy, lumbar region: Secondary | ICD-10-CM | POA: Diagnosis present

## 2021-01-08 DIAGNOSIS — E8809 Other disorders of plasma-protein metabolism, not elsewhere classified: Secondary | ICD-10-CM | POA: Diagnosis not present

## 2021-01-08 DIAGNOSIS — R079 Chest pain, unspecified: Secondary | ICD-10-CM | POA: Diagnosis not present

## 2021-01-08 DIAGNOSIS — Z9842 Cataract extraction status, left eye: Secondary | ICD-10-CM

## 2021-01-08 DIAGNOSIS — R1310 Dysphagia, unspecified: Secondary | ICD-10-CM | POA: Diagnosis present

## 2021-01-08 DIAGNOSIS — Z87442 Personal history of urinary calculi: Secondary | ICD-10-CM

## 2021-01-08 DIAGNOSIS — J910 Malignant pleural effusion: Secondary | ICD-10-CM | POA: Diagnosis present

## 2021-01-08 DIAGNOSIS — Z9841 Cataract extraction status, right eye: Secondary | ICD-10-CM

## 2021-01-08 DIAGNOSIS — J189 Pneumonia, unspecified organism: Secondary | ICD-10-CM

## 2021-01-08 DIAGNOSIS — R0603 Acute respiratory distress: Secondary | ICD-10-CM | POA: Diagnosis present

## 2021-01-08 DIAGNOSIS — T402X5D Adverse effect of other opioids, subsequent encounter: Secondary | ICD-10-CM | POA: Diagnosis not present

## 2021-01-08 DIAGNOSIS — I712 Thoracic aortic aneurysm, without rupture: Secondary | ICD-10-CM | POA: Diagnosis not present

## 2021-01-08 DIAGNOSIS — R918 Other nonspecific abnormal finding of lung field: Secondary | ICD-10-CM

## 2021-01-08 DIAGNOSIS — R911 Solitary pulmonary nodule: Secondary | ICD-10-CM | POA: Diagnosis not present

## 2021-01-08 DIAGNOSIS — R590 Localized enlarged lymph nodes: Secondary | ICD-10-CM | POA: Diagnosis present

## 2021-01-08 DIAGNOSIS — Z87891 Personal history of nicotine dependence: Secondary | ICD-10-CM

## 2021-01-08 DIAGNOSIS — Z20822 Contact with and (suspected) exposure to covid-19: Secondary | ICD-10-CM | POA: Diagnosis present

## 2021-01-08 DIAGNOSIS — J84112 Idiopathic pulmonary fibrosis: Secondary | ICD-10-CM | POA: Diagnosis present

## 2021-01-08 DIAGNOSIS — Z79899 Other long term (current) drug therapy: Secondary | ICD-10-CM

## 2021-01-08 DIAGNOSIS — W19XXXA Unspecified fall, initial encounter: Secondary | ICD-10-CM | POA: Diagnosis present

## 2021-01-08 DIAGNOSIS — C7989 Secondary malignant neoplasm of other specified sites: Secondary | ICD-10-CM | POA: Diagnosis present

## 2021-01-08 DIAGNOSIS — C787 Secondary malignant neoplasm of liver and intrahepatic bile duct: Secondary | ICD-10-CM | POA: Diagnosis present

## 2021-01-08 DIAGNOSIS — R404 Transient alteration of awareness: Secondary | ICD-10-CM | POA: Diagnosis not present

## 2021-01-08 DIAGNOSIS — M545 Low back pain, unspecified: Secondary | ICD-10-CM | POA: Diagnosis not present

## 2021-01-08 DIAGNOSIS — N210 Calculus in bladder: Secondary | ICD-10-CM | POA: Diagnosis not present

## 2021-01-08 HISTORY — DX: Dysphagia, unspecified: R13.10

## 2021-01-08 HISTORY — DX: Malignant neoplasm of unspecified part of unspecified bronchus or lung: C34.90

## 2021-01-08 LAB — COMPREHENSIVE METABOLIC PANEL
ALT: 22 U/L (ref 0–44)
AST: 19 U/L (ref 15–41)
Albumin: 2.2 g/dL — ABNORMAL LOW (ref 3.5–5.0)
Alkaline Phosphatase: 175 U/L — ABNORMAL HIGH (ref 38–126)
Anion gap: 7 (ref 5–15)
BUN: 28 mg/dL — ABNORMAL HIGH (ref 8–23)
CO2: 28 mmol/L (ref 22–32)
Calcium: 10.7 mg/dL — ABNORMAL HIGH (ref 8.9–10.3)
Chloride: 101 mmol/L (ref 98–111)
Creatinine, Ser: 0.73 mg/dL (ref 0.61–1.24)
GFR, Estimated: >60 mL/min (ref >60)
Glucose, Bld: 93 mg/dL (ref 70–99)
Potassium: 3.7 mmol/L (ref 3.5–5.1)
Sodium: 136 mmol/L (ref 135–145)
Total Bilirubin: 1.3 mg/dL — ABNORMAL HIGH (ref 0.3–1.2)
Total Protein: 5.7 g/dL — ABNORMAL LOW (ref 6.5–8.1)

## 2021-01-08 LAB — CBC WITH DIFFERENTIAL/PLATELET
Abs Immature Granulocytes: 0.18 10*3/uL — ABNORMAL HIGH (ref 0.00–0.07)
Basophils Absolute: 0.1 10*3/uL (ref 0.0–0.1)
Basophils Relative: 0 %
Eosinophils Absolute: 0.2 10*3/uL (ref 0.0–0.5)
Eosinophils Relative: 1 %
HCT: 38 % — ABNORMAL LOW (ref 39.0–52.0)
Hemoglobin: 11.8 g/dL — ABNORMAL LOW (ref 13.0–17.0)
Immature Granulocytes: 1 %
Lymphocytes Relative: 3 %
Lymphs Abs: 0.8 10*3/uL (ref 0.7–4.0)
MCH: 27.1 pg (ref 26.0–34.0)
MCHC: 31.1 g/dL (ref 30.0–36.0)
MCV: 87.2 fL (ref 80.0–100.0)
Monocytes Absolute: 1.1 10*3/uL — ABNORMAL HIGH (ref 0.1–1.0)
Monocytes Relative: 5 %
Neutro Abs: 20.6 10*3/uL — ABNORMAL HIGH (ref 1.7–7.7)
Neutrophils Relative %: 90 %
Platelets: 563 10*3/uL — ABNORMAL HIGH (ref 150–400)
RBC: 4.36 MIL/uL (ref 4.22–5.81)
RDW: 15.9 % — ABNORMAL HIGH (ref 11.5–15.5)
WBC: 22.9 10*3/uL — ABNORMAL HIGH (ref 4.0–10.5)
nRBC: 0 % (ref 0.0–0.2)

## 2021-01-08 LAB — URINALYSIS, ROUTINE W REFLEX MICROSCOPIC
Bilirubin Urine: NEGATIVE
Glucose, UA: NEGATIVE mg/dL
Hgb urine dipstick: NEGATIVE
Ketones, ur: NEGATIVE mg/dL
Leukocytes,Ua: NEGATIVE
Nitrite: NEGATIVE
Protein, ur: NEGATIVE mg/dL
Specific Gravity, Urine: 1.025 (ref 1.005–1.030)
pH: 6 (ref 5.0–8.0)

## 2021-01-08 LAB — LIPASE, BLOOD: Lipase: 24 U/L (ref 11–51)

## 2021-01-08 IMAGING — CT CT L SPINE W/O CM
3 series · 9 of 33 positions shown, 11 images · IV contrast (agent unspecified)
Comparison: CT abdomen and pelvis [DATE]

CLINICAL DATA: Back pain.

EXAM:
CT LUMBAR SPINE WITH CONTRAST
TECHNIQUE: 
TECHNIQUE: Multiplanar CT images of the lumbar spine were
reconstructed from contemporary CT of the Abdomen and Pelvis.
CONTRAST:  No additional

[Series 1: l spine axial · axial · 0.31mm/px · z∈[+890,+890]mm · 1 of 148 slices shown, 2 images]
[im 80/148  soft-tissue]
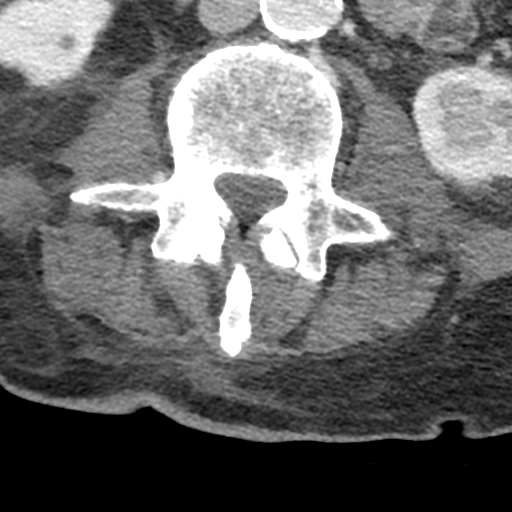
[im 80/148  bone]
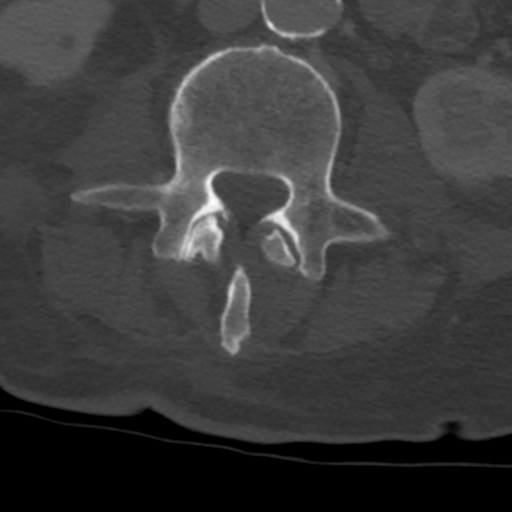

[Series 4: l spine · coronal · 0.42mm/px · 3 of 130 slices shown (1 of 2)]
[im 26/130  bone]
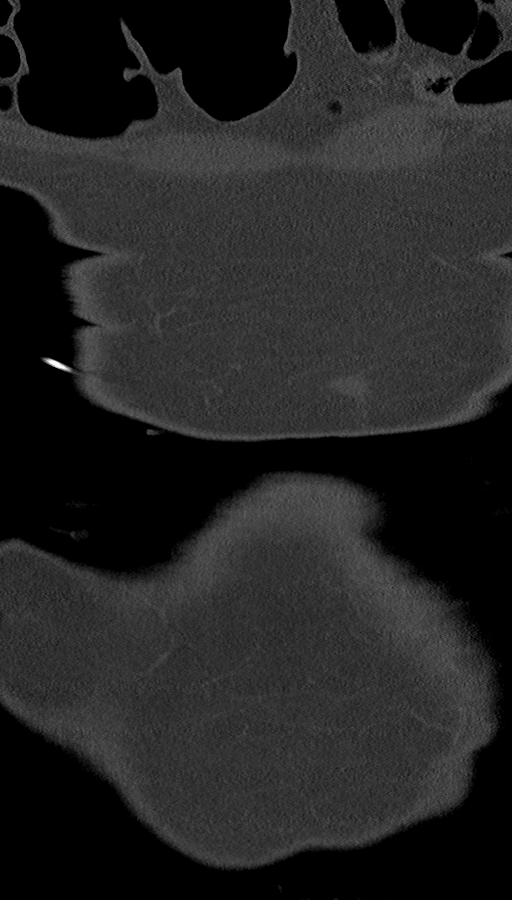
[im 52/130  bone]
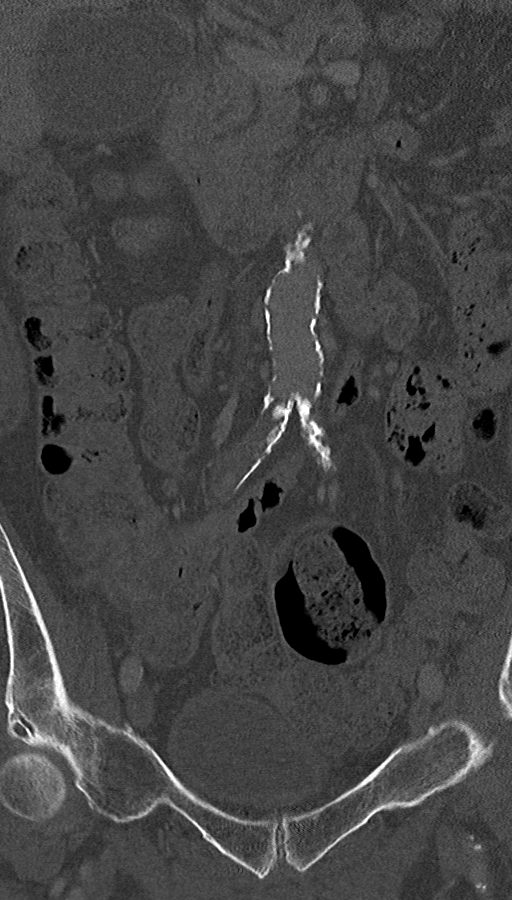
[im 78/130  bone]
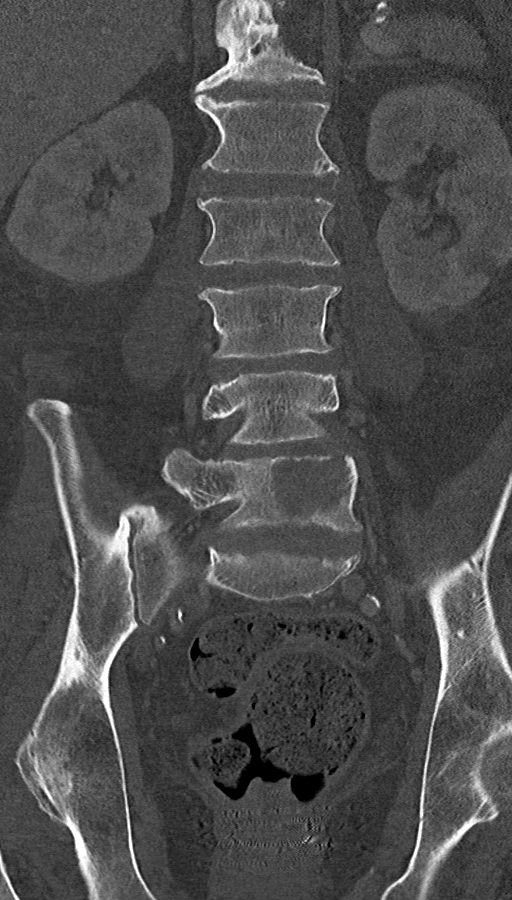

[Series 5: l spine · sagittal · 0.35mm/px · 5 of 83 slices shown, 6 images (2 of 2)]
[im 28/83  bone]
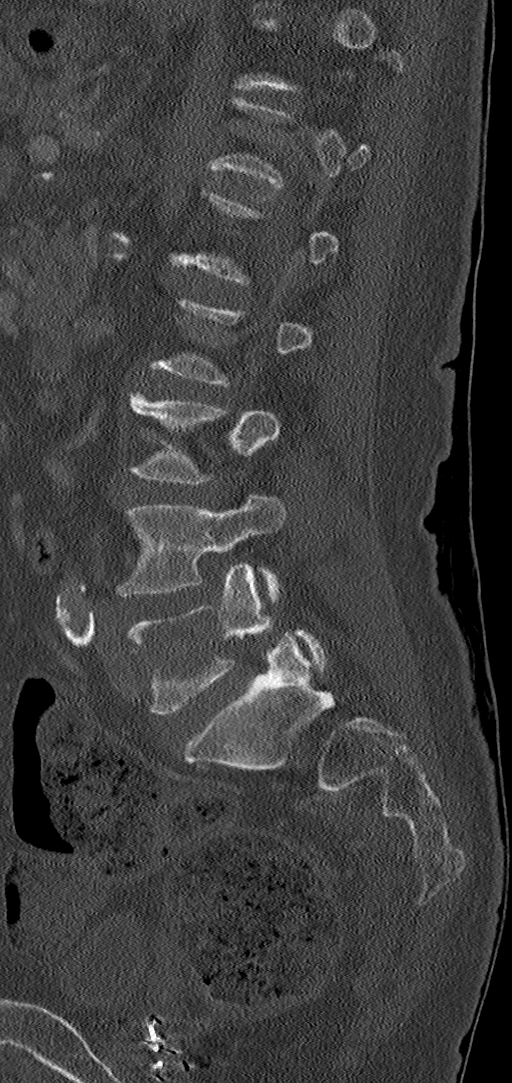
[im 35/83  bone]
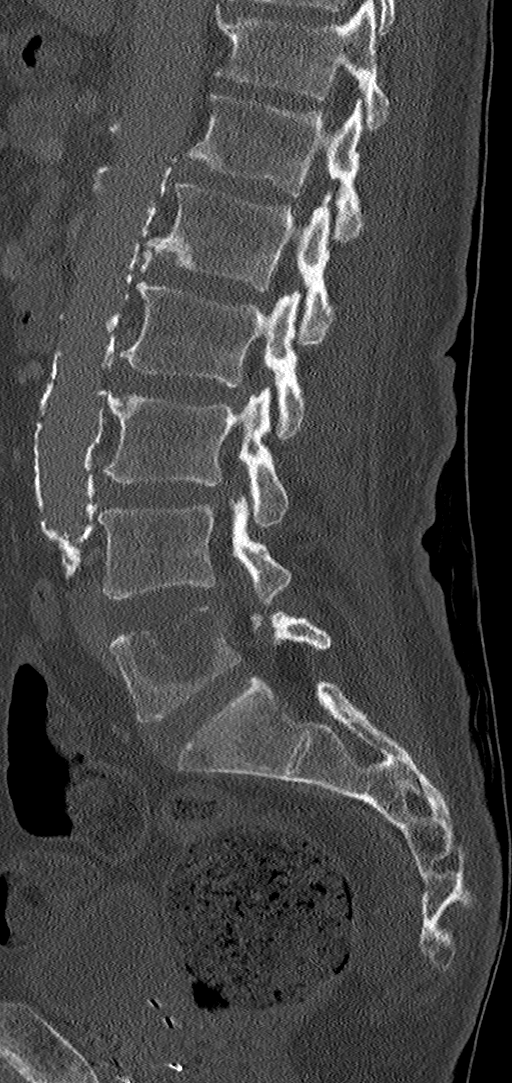
[im 42/83  soft-tissue]
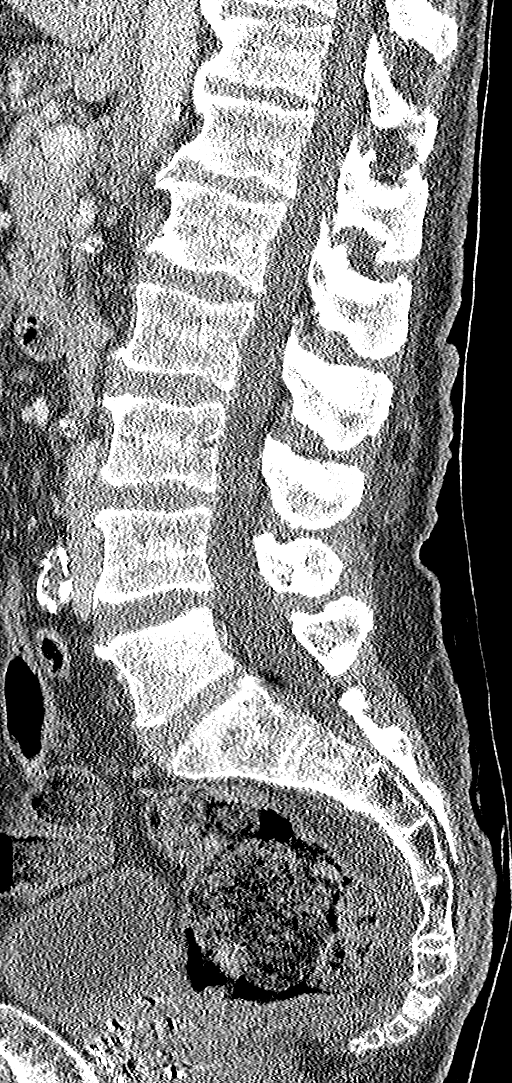
[im 42/83  bone]
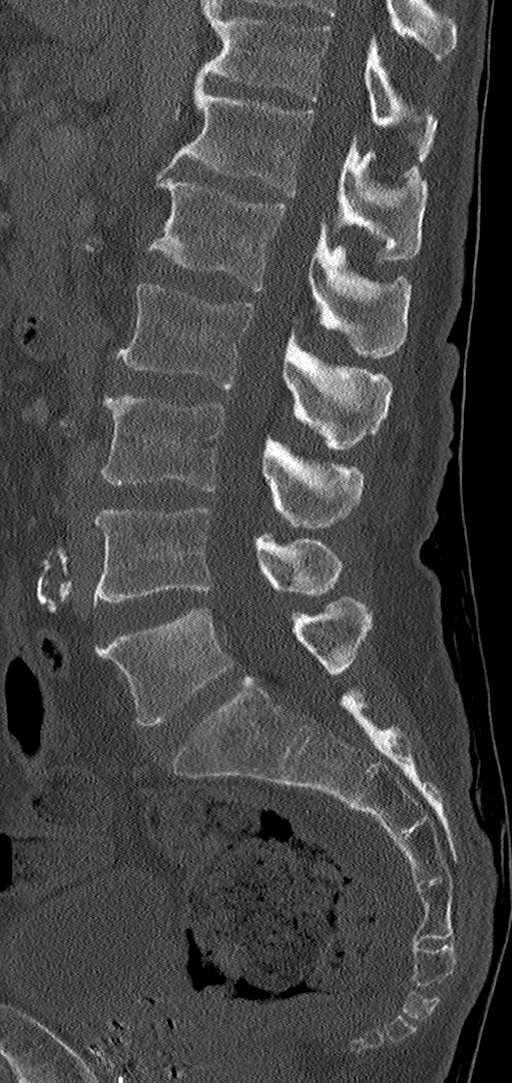
[im 48/83  bone]
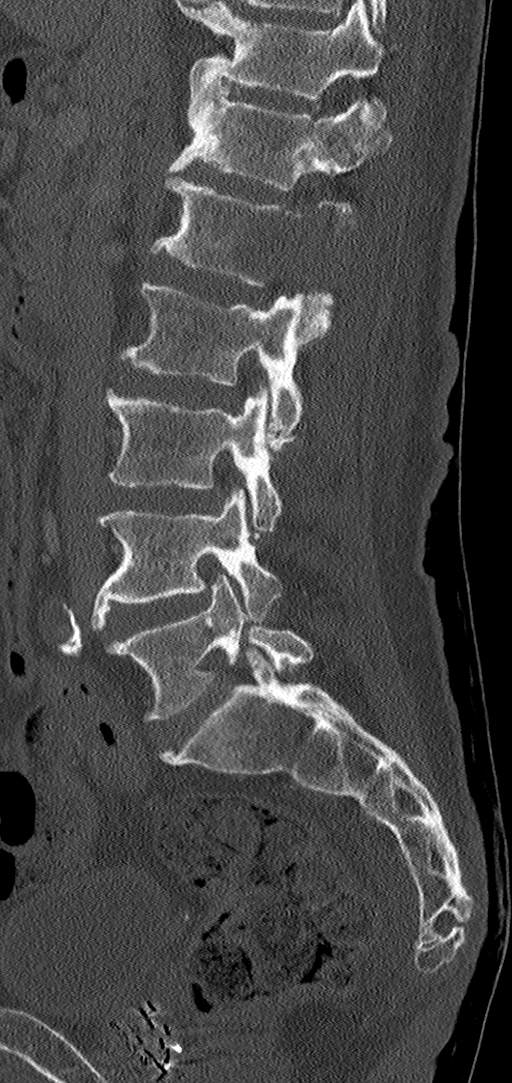
[im 55/83  bone]
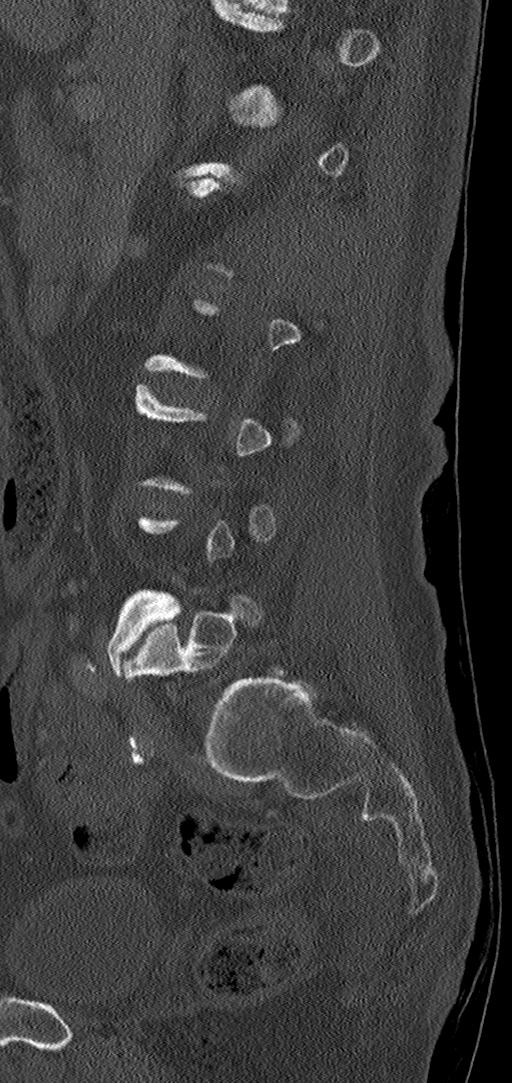

[9 of 33 positions shown; findings below may reference images not displayed]

FINDINGS: Segmentation: 5 lumbar type vertebrae.

Alignment: Normal.

Vertebrae: Chronic bilateral L5 pars defects. 5 cm destructive mass
involving the right-sided posterior elements and posterior vertebral
body of L1 mildly encroaching upon the right L1-2 neural foramen.
3.7 cm lytic lesion in the left aspect of the L5 vertebral body with
focal disruption of the posterior vertebral body cortex and likely
small volume epidural tumor in the left lateral recess. Preserved
vertebral body heights.

Paraspinal and other soft tissues: 2.5 cm hypodense mass in the
paraspinal soft tissues posterior to the right L2 transverse
process. Intra-abdominal and pelvic contents reported separately.

Disc levels: Mild lumbar spondylosis and facet arthrosis without
high-grade spinal stenosis. Mild-to-moderate multilevel neural
foraminal stenosis.
IMPRESSION: 1. Destructive bone lesions at L1 and L5 consistent with metastatic
disease. Suspected small volume epidural tumor in the left lateral
recess at L5.
2. 2.5 cm paraspinal soft tissue mass posterior to the right L2
transverse process also concerning for metastatic disease.

## 2021-01-08 IMAGING — CT CT ANGIO CHEST
2 of 6 series · 17 of 46 positions shown · IV contrast (Omnipaque or Isovue)
Comparison: Prior CT scan of the chest [DATE]

CLINICAL DATA: Chest pain.  High probability for PE.

EXAM:
CT ANGIOGRAPHY CHEST WITH CONTRAST
TECHNIQUE: Multidetector CT imaging of the chest was performed using the
standard protocol during bolus administration of intravenous
contrast. Multiplanar CT image reconstructions and MIPs were
obtained to evaluate the vascular anatomy.
CONTRAST:  100mL OMNIPAQUE IOHEXOL 350 MG/ML SOLN

[Series 5: pe axial thins · axial · 0.88mm/px · z∈[+1002,+1282]mm · 14 of 383 slices shown]
[im 16/383  lung]
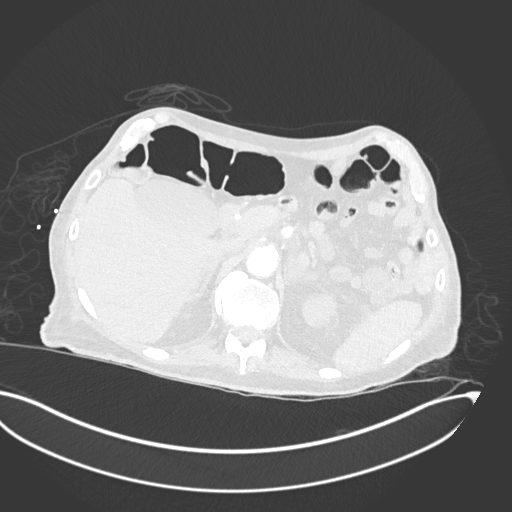
[im 48/383  soft-tissue]
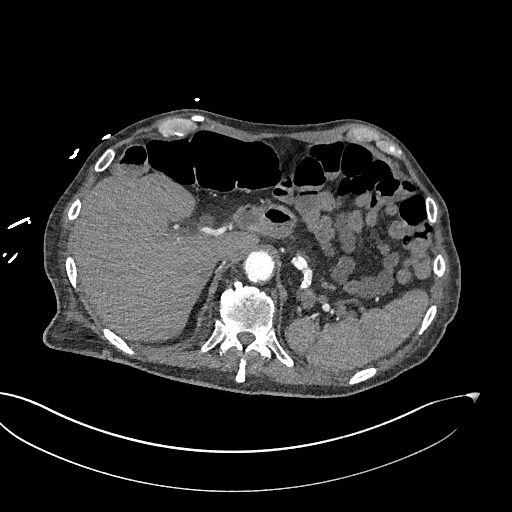
[im 80/383  lung]
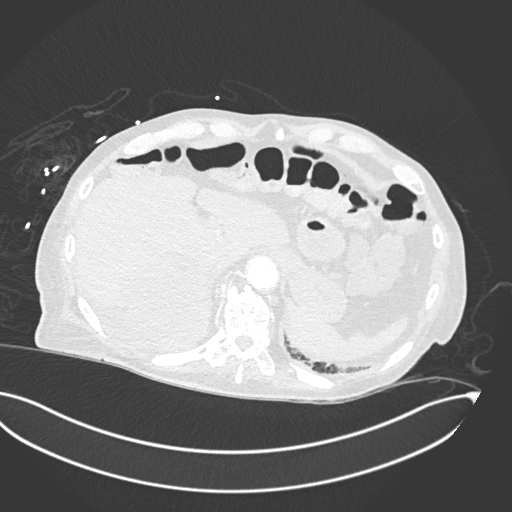
[im 96/383  soft-tissue]
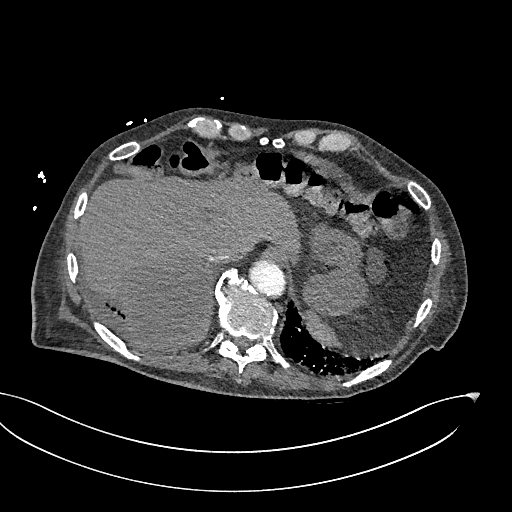
[im 128/383  lung]
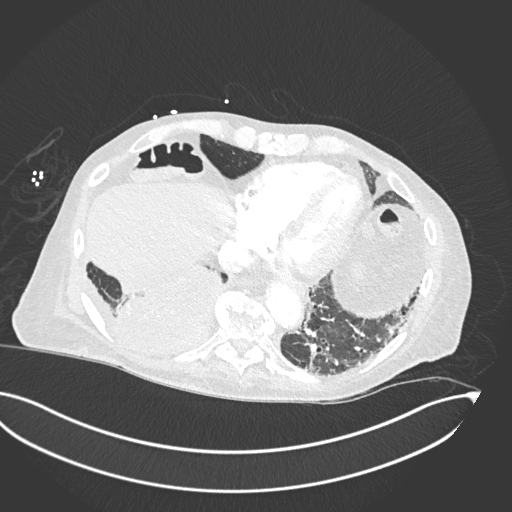
[im 160/383  soft-tissue]
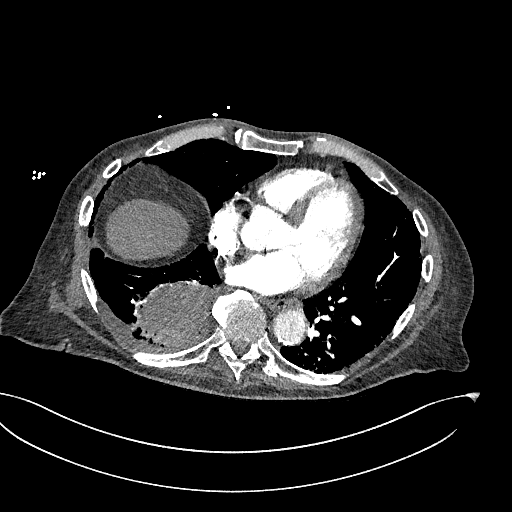
[im 176/383  lung]
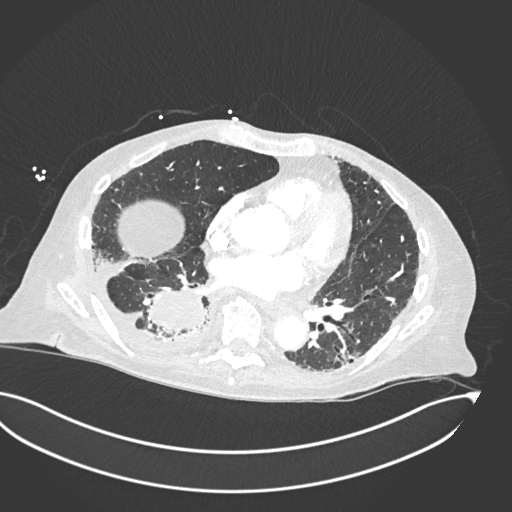
[im 207/383  soft-tissue]
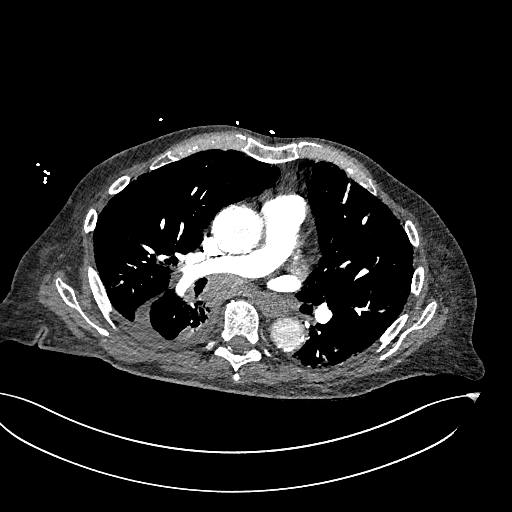
[im 223/383  lung]
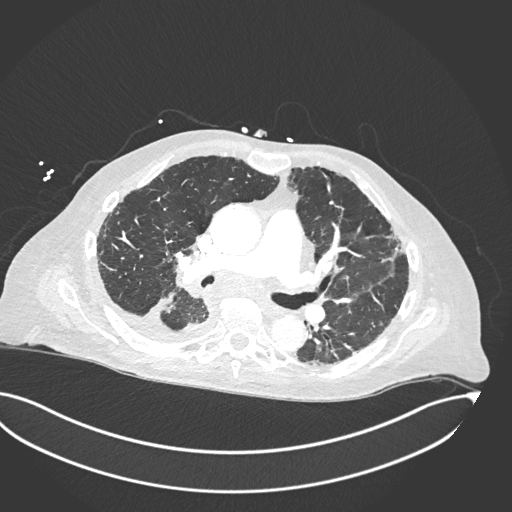
[im 255/383  soft-tissue]
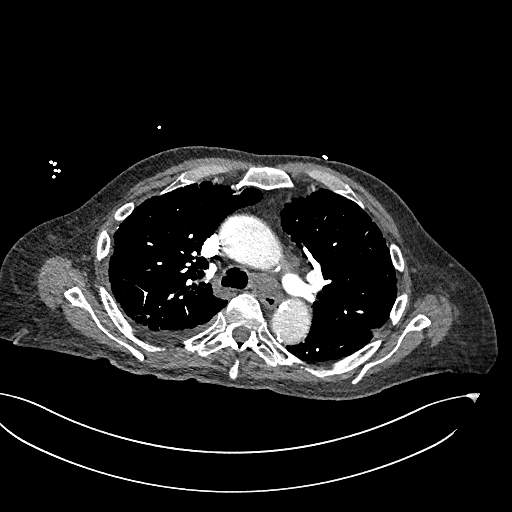
[im 287/383  lung]
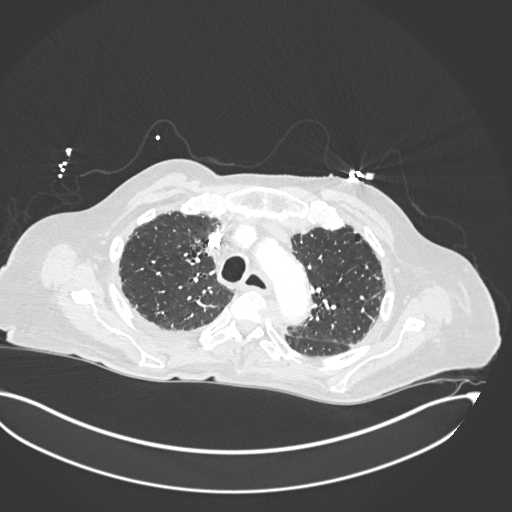
[im 303/383  soft-tissue]
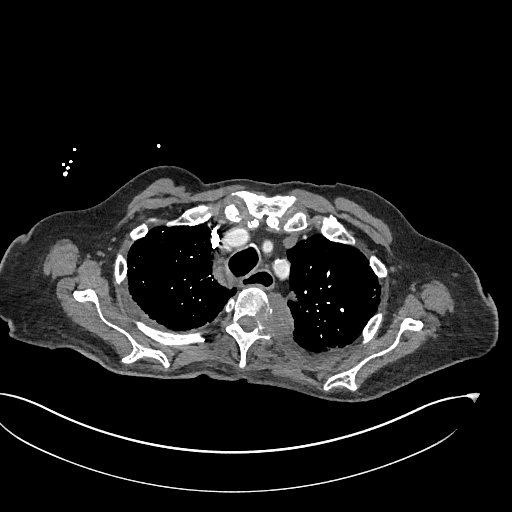
[im 335/383  lung]
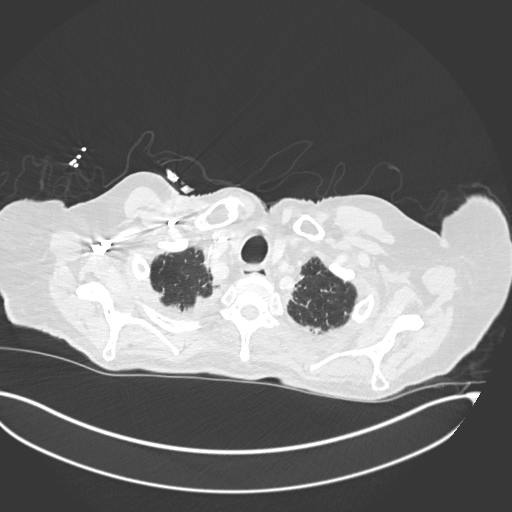
[im 367/383  soft-tissue]
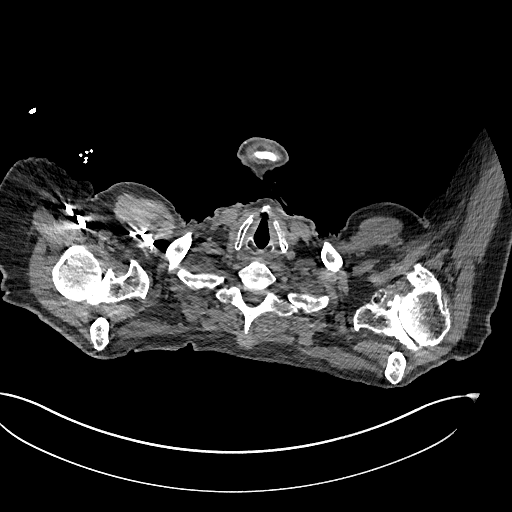

[Series 7: cor soft · coronal · 0.63mm/px · 3 of 138 slices shown]
[im 35/138  soft-tissue]
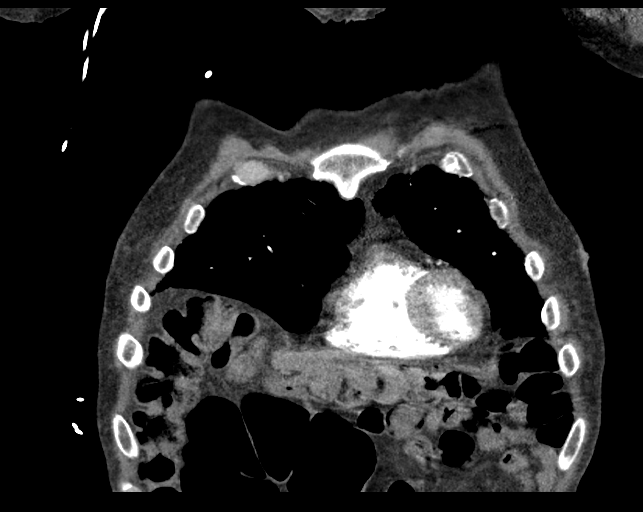
[im 69/138  soft-tissue]
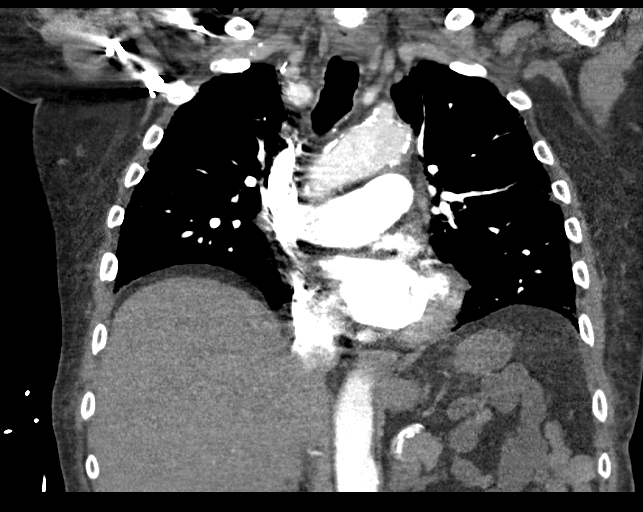
[im 103/138  soft-tissue]
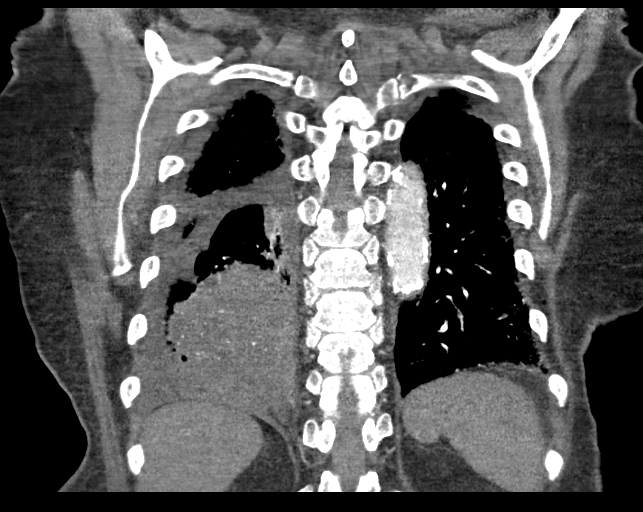

[17 of 46 positions shown; findings below may reference images not displayed]

FINDINGS: Cardiovascular: Satisfactory opacification of the pulmonary arteries
to the segmental level. No evidence of pulmonary embolism. Normal
heart size. No pericardial effusion. Fusiform aneurysmal dilation of
the ascending thoracic aorta with a maximal transverse diameter of
4.5 cm. Scattered atherosclerotic calcifications throughout the
aorta and the native coronary arteries.

Mediastinum/Nodes: Unremarkable thyroid gland. High right
paratracheal lymph node measures 1.4 cm in short axis (image 22
series 4). Left paratracheal lymph node measures 1.5 cm in short
axis (image 34 series 4). Subcarinal lymphadenopathy measures
approximately 2.2 cm in short axis (image 42 series 4). Posterior
left mediastinal mass measures a proximally 4.5 x 2.8 x 3.8 cm
indirectly invades the head and neck of the left third rib. The mass
also extends into the neural foramen.

Lungs/Pleura: Approximally 9.0 x 8.0 x 8.7 cm ill-defined
heterogeneous mass centered in the posteromedial aspect of the right
lower lobe contains numerous punctate internal calcifications. The
mass also appears to extend across the diaphragm and directly into
segment 7 of the liver. There is a small and likely malignant
associated right pleural effusion. 0.6 cm right middle lobe
pulmonary nodule (image 73 series 6) is indeterminate. 2 mm nodule
more superiorly on image 67 also noted. Peripheral subpleural
reticulation, architectural distortion and honeycombing in the left
lung base suggests underlying pulmonary fibrosis.

Upper Abdomen: No acute abnormality.

Musculoskeletal: Soft tissue lytic lesion destroying the head and
neck of the left third rib with extension into the neural foramen as
described above. No additional foci of osseous metastatic disease
identified.

Review of the MIP images confirms the above findings.
IMPRESSION: 1. Approximally 9 cm ill-defined heterogeneous right lower lobe mass
appears to cross the diaphragm and directly invade segment 7 of the
liver. Findings are highly concerning for advanced primary
bronchogenic carcinoma.
2. Lytic soft tissue metastasis in the left posterior mediastinum
centered on the head and neck of the left third rib. The mass
encroaches into the left T3-T4 neural foramen.
3. Presumably metastatic mediastinal lymphadenopathy.
4. Small right-sided pleural effusion is likely malignant.
5. Nonspecific right middle lobe pulmonary nodules measuring 0.6 and
0.2 cm. Small pulmonary metastases versus incidental benign nodules.
6. Pulmonary fibrosis with a pattern suggestive of usual
interstitial pneumonitis (UIP).
7. Thoracic aortic aneurysm measuring up to 4.5 cm. Ascending
thoracic aortic aneurysm. Recommend semi-annual imaging followup by
CTA or MRA and referral to cardiothoracic surgery if not already
obtained. This recommendation follows [LJ]
ACCF/AHA/AATS/ACR/ASA/SCA/RUDI/RUDI/RUDI/RUDI Guidelines for the
Diagnosis and Management of Patients With Thoracic Aortic Disease.
Circulation. [LJ]; 121: [id number]. Aortic aneurysm NOS
([LJ]-[LJ]); Aortic Atherosclerosis ([LJ]-[LJ]).
8. Coronary artery calcifications.

## 2021-01-08 IMAGING — CT CT ABD-PELV W/ CM
2 of 5 series · 15 of 46 positions shown, 17 images · IV contrast (Omnipaque or Isovue)
Comparison: CT [DATE], chest CT [DATE],

CLINICAL DATA: Left lower quadrant pain

EXAM:
CT ABDOMEN AND PELVIS WITH CONTRAST
TECHNIQUE: Multidetector CT imaging of the abdomen and pelvis was performed
using the standard protocol following bolus administration of
intravenous contrast.
CONTRAST:  100mL OMNIPAQUE IOHEXOL 350 MG/ML SOLN

[Series 2: axial st · axial · 0.88mm/px · z∈[+611,+1131]mm · 12 of 118 slices shown, 14 images]
[im 7/118  soft-tissue]
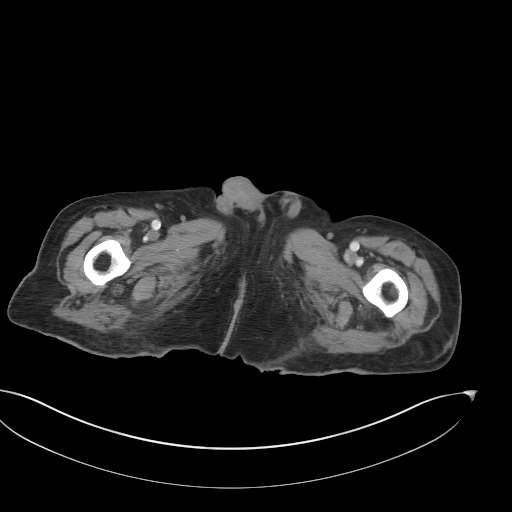
[im 7/118  bone]
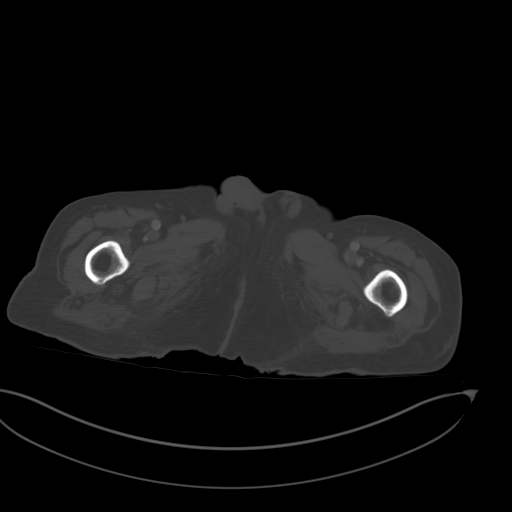
[im 19/118  soft-tissue]
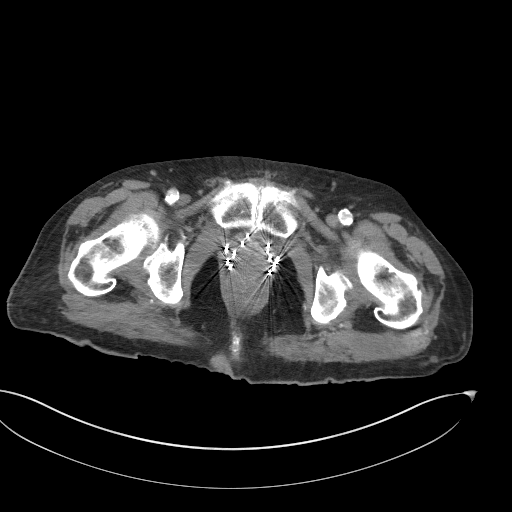
[im 25/118  soft-tissue]
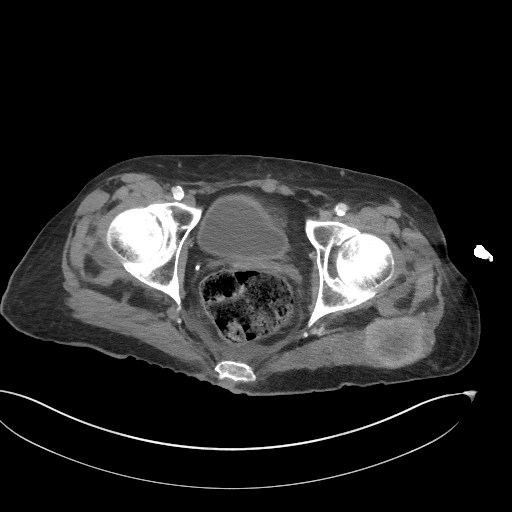
[im 37/118  soft-tissue]
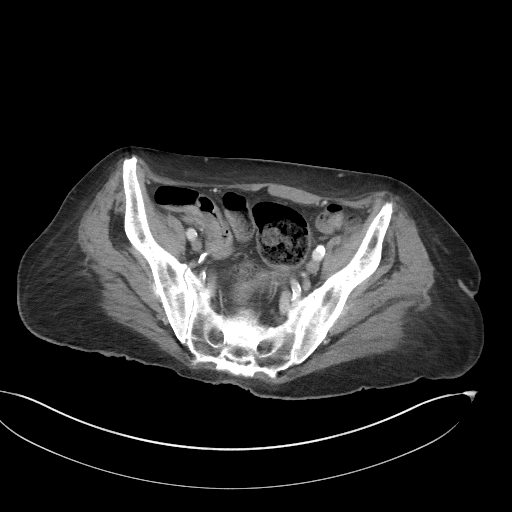
[im 44/118  soft-tissue]
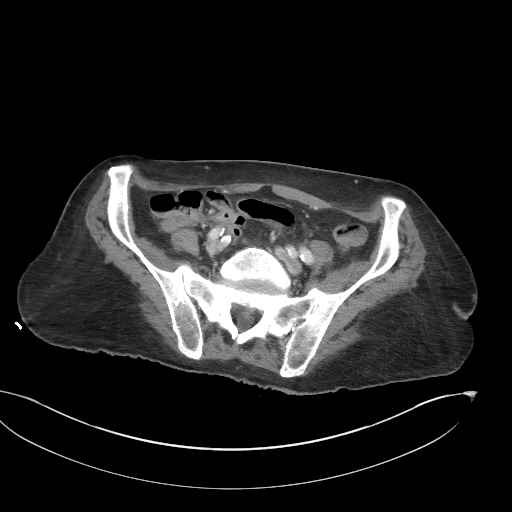
[im 56/118  soft-tissue]
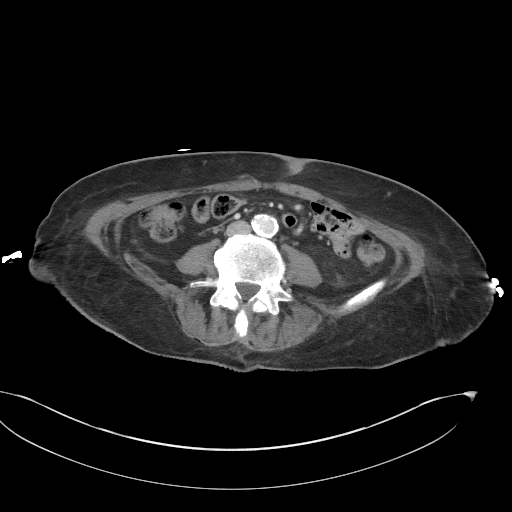
[im 62/118  soft-tissue]
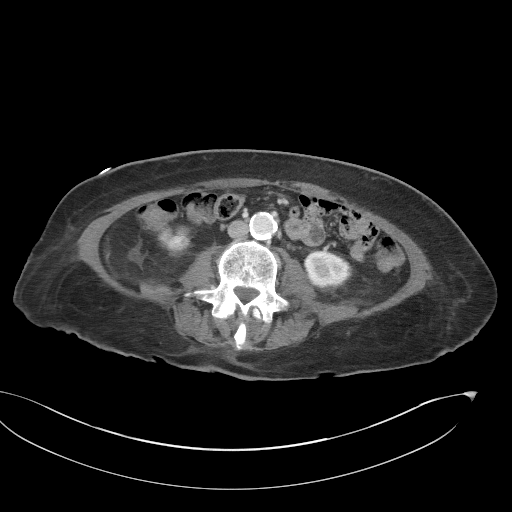
[im 74/118  soft-tissue]
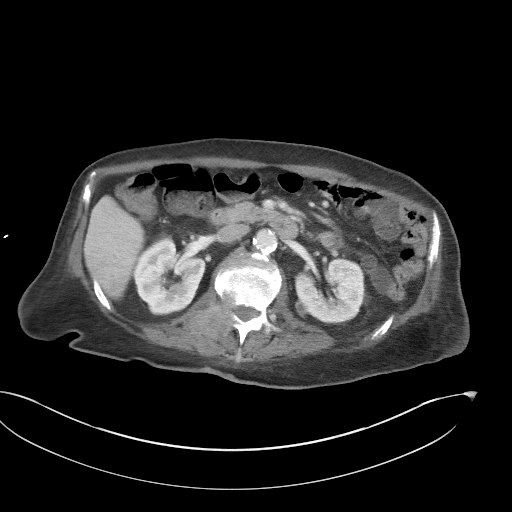
[im 81/118  soft-tissue]
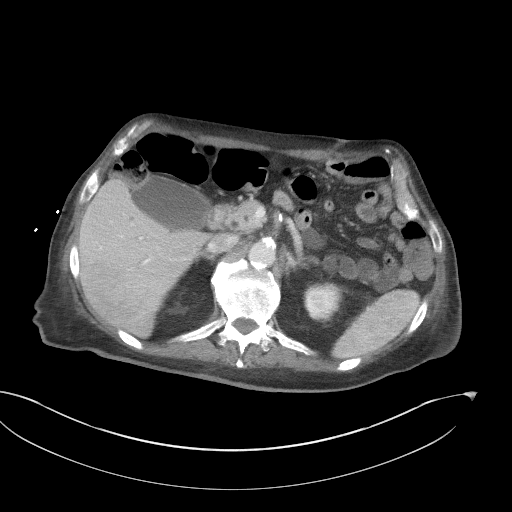
[im 81/118  bone]
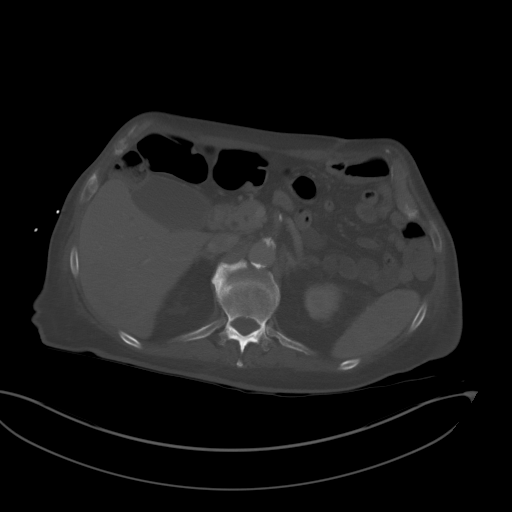
[im 93/118  soft-tissue]
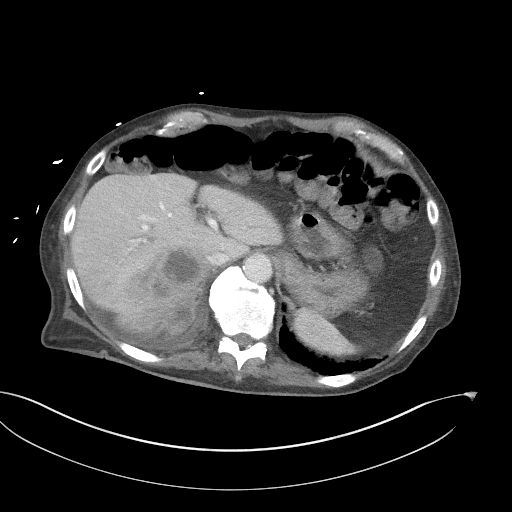
[im 99/118  soft-tissue]
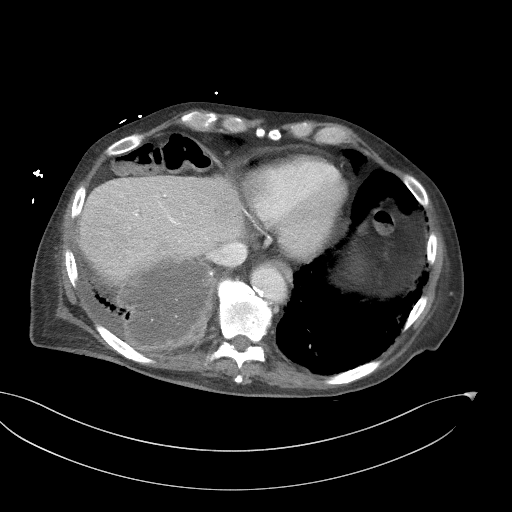
[im 111/118  soft-tissue]
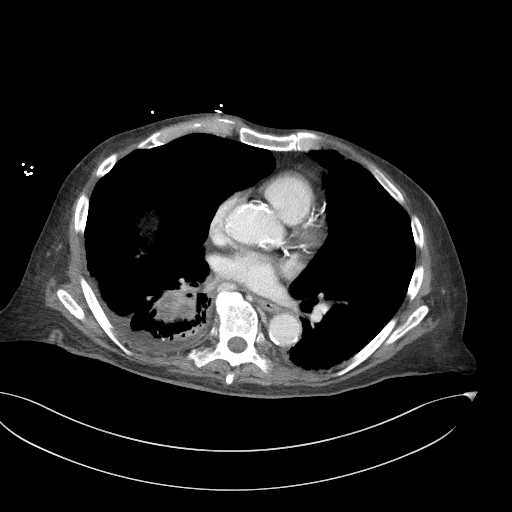

[Series 5: coronal st · coronal · 0.93mm/px · 3 of 95 slices shown]
[im 32/95  soft-tissue]
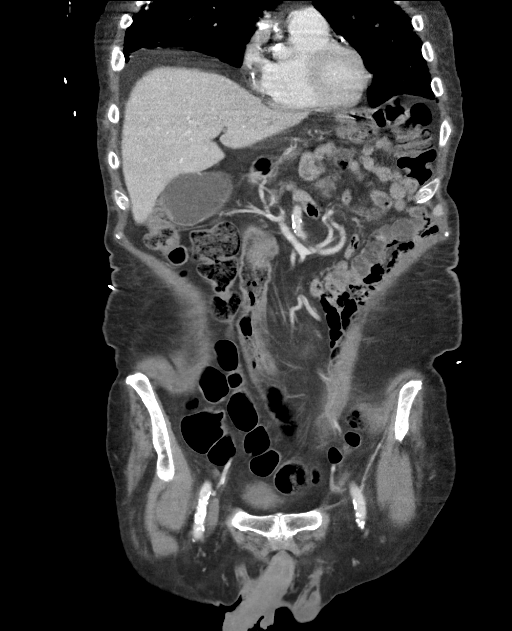
[im 42/95  soft-tissue]
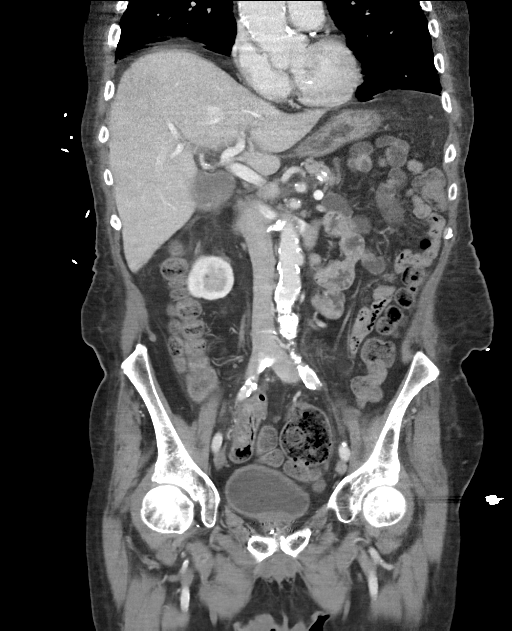
[im 53/95  soft-tissue]
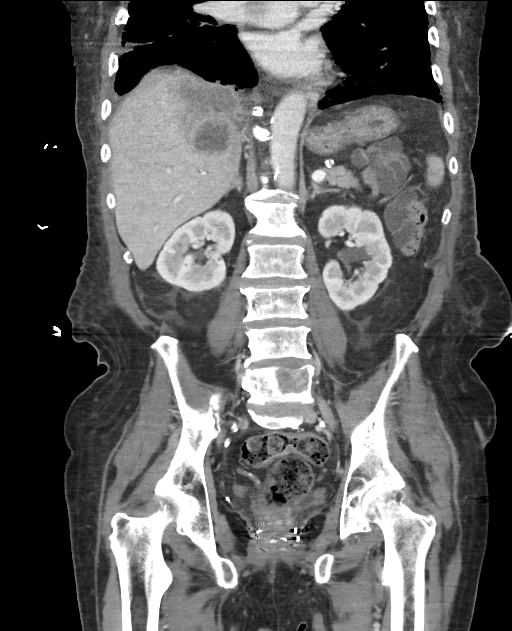

[15 of 46 positions shown; findings below may reference images not displayed]

FINDINGS: Lower chest: Mild aneurysmal dilatation of the ascending aorta
measuring up to 4.4 cm, heavily calcified. Extensive coronary
vascular calcification. Normal cardiac size. Trace pericardial
effusion. Incompletely visualized necrotic appearing subcarinal
lymph node measuring up to 2.6 cm. Large necrotic appearing mass in
the medial right base with punctate calcifications, this measures
approximately 9.6 by 7.8 cm and extends across the right diaphragm,
into the adjacent liver. Thick rimmed hypodense enhancing lesion in
the right hepatic lobe measuring 3.8 cm. Small right-sided pleural
effusion

Hepatobiliary: No calcified gallstone. No biliary dilatation.
Heterogenous enhancement of right hepatic lobe adjacent to mass
lesion.

Pancreas: Unremarkable. No pancreatic ductal dilatation or
surrounding inflammatory changes.

Spleen: Indeterminate hypoenhancing mass within the spleen measuring
2.4 cm, series 2, image 34.

Adrenals/Urinary Tract: Adrenal glands are normal. Punctate
nonobstructing stones within the bilateral kidneys. Scattered renal
cysts. Interim finding of 14 mm indeterminate right renal lesion at
the midpole, series 2, image 52. Interim finding of indeterminate 13
mm exophytic lesion mid pole left kidney, series 2, image 44.
Slightly thick-walled urinary bladder. 4 mm stone within the right
posterior bladder.

Stomach/Bowel: The stomach is nonenlarged. No dilated small bowel.
No acute bowel wall thickening. Moderate to large stool in the
rectosigmoid colon with moderate formed feces at the rectum.
Negative appendix

Vascular/Lymphatic: Advanced aortic atherosclerosis. No aneurysm. No
suspicious nodes.

Reproductive: Post treatment changes of the prostate gland

Other: Presacral edema and soft tissue stranding.  No free air.

Musculoskeletal: Lytic lesion and soft tissue mass involving the L1
vertebra right pedicle, lamina, vertebral body and transverse
process. Soft tissue mass involves the paraspinal soft tissues as
well. 2.5 cm hypodense lesion in the right paraspinal soft tissues
posterior to right transverse process at L2, series 2, image 49.
Large lytic lesion L5 vertebral body. Chronic bilateral pars defect
at L5. 5.5 x 4.3 cm enhancing heterogenous soft tissue mass in the
left gluteus muscles, series 2, image 93.
IMPRESSION: 1. Small right-sided pleural effusion with large necrotic appearing
mass in the posteromedial right lower lobe that appears to traverse
the right diaphragm and extends into the adjacent liver.
2. Lytic lesion with associated soft tissue mass at L1 with
additional lytic lesion at L5 vertebral body consistent with
metastatic disease. 5.5 cm heterogenous enhancing soft tissue mass
in the left gluteus muscles also suspect for metastatic disease.
Small hypodense right paraspinal soft tissue mass posterior to
transverse process of L2, concerning for metastatic disease
3. Vague hypodensity within the mid spleen is indeterminate for
infarct or hypodense mass lesion.
4. Interval development of bilateral indeterminate renal lesions
measuring up to 13 mm.
5. Nonobstructing kidney stones.  4 mm stone in the bladder

## 2021-01-08 MED ORDER — ACETAMINOPHEN 325 MG PO TABS: 650.0000 mg | ORAL_TABLET | Freq: Four times a day (QID) | ORAL | Status: DC | PRN

## 2021-01-08 MED ORDER — ENSURE ENLIVE PO LIQD
237.0000 mL | Freq: Two times a day (BID) | ORAL | Status: DC
  Administered 2021-01-09 – 2021-01-10 (×2): 237 mL via ORAL

## 2021-01-08 MED ORDER — MORPHINE SULFATE (PF) 4 MG/ML IV SOLN
4.0000 mg | INTRAVENOUS | Status: DC | PRN
Start: 2021-01-08 — End: 2021-01-10
  Administered 2021-01-09 – 2021-01-10 (×5): 4 mg via INTRAVENOUS
  Filled 2021-01-08 (×5): qty 1

## 2021-01-08 MED ORDER — LISINOPRIL 2.5 MG PO TABS
2.5000 mg | ORAL_TABLET | Freq: Every evening | ORAL | Status: DC
  Administered 2021-01-09 – 2021-01-11 (×3): 2.5 mg via ORAL
  Filled 2021-01-08 (×4): qty 1

## 2021-01-08 MED ORDER — SODIUM CHLORIDE 0.9 % IV SOLN
500.0000 mg | Freq: Once | INTRAVENOUS | Status: AC
  Administered 2021-01-08: 500 mg via INTRAVENOUS
  Filled 2021-01-08: qty 500

## 2021-01-08 MED ORDER — HEPARIN SODIUM (PORCINE) 5000 UNIT/ML IJ SOLN
5000.0000 [IU] | Freq: Three times a day (TID) | INTRAMUSCULAR | Status: DC
  Administered 2021-01-09 (×2): 5000 [IU] via SUBCUTANEOUS
  Filled 2021-01-08 (×3): qty 1

## 2021-01-08 MED ORDER — SODIUM CHLORIDE 0.9 % IV SOLN
2.0000 g | INTRAVENOUS | Status: AC
  Administered 2021-01-09 – 2021-01-13 (×5): 2 g via INTRAVENOUS
  Filled 2021-01-08 (×5): qty 20

## 2021-01-08 MED ORDER — SODIUM CHLORIDE 0.9 % IV SOLN
500.0000 mg | INTRAVENOUS | Status: AC
  Administered 2021-01-09 – 2021-01-13 (×5): 500 mg via INTRAVENOUS
  Filled 2021-01-08 (×5): qty 500

## 2021-01-08 MED ORDER — SODIUM CHLORIDE 0.9 % IV SOLN: INTRAVENOUS | Status: DC

## 2021-01-08 MED ORDER — ALBUTEROL SULFATE (2.5 MG/3ML) 0.083% IN NEBU: 2.5000 mg | INHALATION_SOLUTION | RESPIRATORY_TRACT | Status: DC | PRN

## 2021-01-08 MED ORDER — METHYLNALTREXONE BROMIDE 12 MG/0.6ML ~~LOC~~ SOLN
12.0000 mg | Freq: Once | SUBCUTANEOUS | Status: AC
  Administered 2021-01-08: 12 mg via SUBCUTANEOUS
  Filled 2021-01-08: qty 0.6

## 2021-01-08 MED ORDER — SODIUM CHLORIDE 0.9 % IV SOLN: Freq: Once | INTRAVENOUS | Status: AC

## 2021-01-08 MED ORDER — ROSUVASTATIN CALCIUM 5 MG PO TABS
5.0000 mg | ORAL_TABLET | Freq: Every evening | ORAL | Status: DC
  Administered 2021-01-09 – 2021-01-11 (×3): 5 mg via ORAL
  Filled 2021-01-08 (×5): qty 1

## 2021-01-08 MED ORDER — ONDANSETRON HCL 4 MG/2ML IJ SOLN
4.0000 mg | Freq: Four times a day (QID) | INTRAMUSCULAR | Status: DC | PRN
  Administered 2021-01-16 (×2): 4 mg via INTRAVENOUS
  Filled 2021-01-08 (×2): qty 2

## 2021-01-08 MED ORDER — SODIUM CHLORIDE 0.9 % IV SOLN
1.0000 g | Freq: Once | INTRAVENOUS | Status: AC
  Administered 2021-01-08: 1 g via INTRAVENOUS
  Filled 2021-01-08: qty 10

## 2021-01-08 MED ORDER — ONDANSETRON HCL 4 MG/2ML IJ SOLN
4.0000 mg | Freq: Once | INTRAMUSCULAR | Status: AC
  Administered 2021-01-08: 4 mg via INTRAVENOUS
  Filled 2021-01-08: qty 2

## 2021-01-08 MED ORDER — SODIUM CHLORIDE 0.9 % IV BOLUS
1000.0000 mL | Freq: Once | INTRAVENOUS | Status: AC
  Administered 2021-01-08: 1000 mL via INTRAVENOUS

## 2021-01-08 MED ORDER — MORPHINE SULFATE (PF) 4 MG/ML IV SOLN
4.0000 mg | Freq: Once | INTRAVENOUS | Status: AC
Start: 2021-01-08 — End: 2021-01-08
  Administered 2021-01-08: 4 mg via INTRAVENOUS
  Filled 2021-01-08: qty 1

## 2021-01-08 MED ORDER — MORPHINE SULFATE (PF) 4 MG/ML IV SOLN
4.0000 mg | Freq: Once | INTRAVENOUS | Status: AC
  Administered 2021-01-08: 4 mg via INTRAVENOUS
  Filled 2021-01-08: qty 1

## 2021-01-08 MED ORDER — IOHEXOL 350 MG/ML SOLN
100.0000 mL | Freq: Once | INTRAVENOUS | Status: AC | PRN
  Administered 2021-01-08: 100 mL via INTRAVENOUS

## 2021-01-08 NOTE — ED Triage Notes (Signed)
Patient brought in by EMS for constipation.  Patient has not used the bathroom in 8 days.

## 2021-01-08 NOTE — ED Notes (Signed)
Family states that patient has a bed sore on his back.  Placed the patient on his left side with padding behind.

## 2021-01-08 NOTE — H&P (Signed)
TRH H&P    Patient Demographics:    Cody Carr, is a 75 y.o. male  MRN: [id number]  DOB - 03-04-1946  Admit Date - 01/08/2021  Referring MD/NP/PA: Tivis Ringer  Outpatient Primary MD for the patient is Monico Blitz, MD  Patient coming from: home  Chief complaint- constipation, back pain, poor PO intake   HPI:    Cody Carr  is a 75 y.o. male, with history of nephrolithiasis, hyperlipidemia, hypertension, prostate cancer with seeds, throat cancer status post chemoradiation per patient, and more presents the ED with a chief complaint of back pain, constipation, and poor p.o. intake.  Patient has hoarseness of voice causing him to only whisper.  He requests that we take history from daughter who is at bedside.  Daughter reports that 3 months ago patient was carrying something heavy and when he dropped to the ground he felt back pain.  He thought it was sciatic pain.  He tried rest and felt like that improved the pain.  When he had a fall 6 weeks ago and had pain in the same place.  He tried rest again and then went to work out at [employer] was not able to do it because he was not able to tolerate the pain.  He describes it as a sharp pain shooting down the back of his leg.  Of note he does have radioactive seeds in his prostate.  Patient went to [facility], was admitted for constipation and dehydration.  They had determined that it was narcotic induced constipation.  Patient had taken some old opiates from when he had the C-spine and the prostate, to try to improve the back pain.  He has been eating for 4 days.  He was discharged 8 days ago.  They had commented that he had abnormal blood work at that time advised him to follow-up with hematology.  He was also supposed to have some durable medical equipment set up at home, but due to the holiday that has not happened yet.  Occupational Therapy came to assess him today,  they noted he had not been eating for 2 days and advised them to come back to the ED.  They tried to set him up to take him to [facility], but he became very short of breath upon sitting up, so they came to the closer hospital which is here at any time.  Patient reports that he has not been eating because he has had a sensation that everything tastes bad.  He has not been taking his MiraLAX but that takes that as well.  His last normal meal was probably 3 days ago when he had sudden onset of everything tasting bad.  They try to give him some boost today, but he immediately had nausea and vomiting.  Daughter reports that the vomiting contained mostly mucus with a little boost.  It was nonbloody.  Patient's last bowel movement was 8 days ago.  He attributes this to having poor p.o. intake since that time.  He has still been voiding 3-4 times  a day.  Therefore the patient does have a dyspnea on exertion that is worse today  They monitor his vitals at home and were concerned because his O2 sats were dropping below 95 at home.  They then report that oxygen dropped below 90 in triage Was placed on 2 L nasal cannula.  Patient will probably have to be discharged with nasal cannula.  Family is very interested in oncology consult.  I advised him that the most important visit that they are going to have during this hospitalization will be palliative care given the extent of metastasis.  I have also tried to discuss CODE STATUS.  In the past patient has been a DNI.  When asked if he would like to continue the [facility] for this visit, he is undecided.  I advised him that he can talk to palliative care and to make that decision together tomorrow.  Patient does not smoke, does not drink alcohol, does not use illicit drugs.  He is vaccinated for COVID.  At this time patient is full code.  In the ED Temp 97.6, heart rate 77-85, respiratory rate 17-22, blood pressure 163/86, satting 100% Leukocytosis with a white blood  cell count of 23, hemoglobin 11.8, thrombocytosis of 563 Chemistry panel is mostly unremarkable Alk phos 175, albumin 2.2, T bili 1.3 CT shows small right-sided pleural effusion with large necrotic appearing mass in the posterior medial right lower lobe that appears to transverse with the right diaphragm and extends into the adjacent liver.  Lytic lesion with associated soft tissue mass at L1 and additional lytic mass at L5.  Metastatic disease 5.5 cm heterogeneous enhancing soft tissue mass in the left gluteal muscle.  Infarct versus hypodense mass lesion in the spleen.  Bilateral renal lesions up to 13 mm.  Morphine and Zofran and fluids given in the ED Admission requested for palliative care consult, and qualify for oxygen to get home O2.  ED provider and I spoke about getting oxygen set up from the ED, and sending patient home for palliative, oncology consult outpatient, but ED provider reports that it is not possible to get him safe discharge tonight.  Family is also very distrustful providers, and is concerned they were discharged from previous outside facility hospitalization 2 soon, but they are not comfortable taking patient home tonight either.    Review of systems:    In addition to the HPI above,  No Fever-chills, No Headache, No changes with Vision or hearing, No problems swallowing food or Liquids, No Chest pain, admits to nonproductive cough and dyspnea No Abdominal pain, admits to 1 episode of nausea/vomiting, constipation  no Blood in stool or Urine, No dysuria, No new skin rashes or bruises, Admits to acute on chronic back pain No new weakness, tingling, numbness in any extremity,, No polyuria, polydypsia or polyphagia, No significant Mental Stressors.  All other systems reviewed and are negative.    Past History of the following :    Past Medical History:  Diagnosis Date   Dyspnea    with exercise   History of kidney stones    Hyperlipemia    Hypertension     Prostate cancer ([place])    Throat cancer ([place]) 2008   treated with Chemo and Radiation      Past Surgical History:  Procedure Laterality Date   dental implants  2008   EXTRACORPOREAL SHOCK WAVE LITHOTRIPSY Right 03/06/2020   Procedure: EXTRACORPOREAL SHOCK WAVE LITHOTRIPSY (ESWL);  Surgeon: Cleon Gustin, MD;  Location:  [facility];  Service: Urology;  Laterality: Right;   EYE SURGERY Bilateral 2021   ioc lens for cataracts   RADIOACTIVE SEED IMPLANT N/A 07/27/2020   Procedure: RADIOACTIVE SEED IMPLANT/BRACHYTHERAPY IMPLANT;  Surgeon: Irine Seal, MD;  Location: [facility];  Service: Urology;  Laterality: N/A;  63 SEEDS   SPACE OAR INSTILLATION N/A 07/27/2020   Procedure: SPACE OAR INSTILLATION;  Surgeon: Irine Seal, MD;  Location: [facility];  Service: Urology;  Laterality: N/A;   throat biopsy  2008      Social History:      Social History   Tobacco Use   Smoking status: Former    Packs/day: 0.50    Years: 12.00    Pack years: 6.00    Types: Cigarettes    Quit date: 1980    Years since quitting: 42.7   Smokeless tobacco: Never  Substance Use Topics   Alcohol use: Not Currently       Family History :     Family History  Problem Relation Age of Onset   Prostate cancer Neg Hx    Breast cancer Neg Hx    Colon cancer Neg Hx    Pancreatic cancer Neg Hx       Home Medications:   Prior to Admission medications   Medication Sig Start Date End Date Taking? Authorizing Provider  Biotin 5000 MCG CAPS Take 5,000 mcg by mouth daily.    [provider]  Cholecalciferol (VITAMIN D3) 50 MCG (2000 UT) TABS Take 2,000 Units by mouth daily.    [provider]  Cyanocobalamin (VITAMIN B-12) 5000 MCG SUBL Take 5,000 mcg by mouth daily.    [provider]  HYDROcodone-acetaminophen (NORCO/VICODIN) 5-325 MG tablet Take 1 tablet by mouth every 6 (six) hours as needed for moderate pain. Patient not taking: Reported on  08/24/2020 07/27/20 07/27/21  Irine Seal, MD  lisinopril (ZESTRIL) 2.5 MG tablet Take 2.5 mg by mouth every evening.     [provider]  Multiple Vitamin (MULTIVITAMIN WITH MINERALS) TABS tablet Take 1 tablet by mouth daily.    [provider]  naproxen sodium (ALEVE) 220 MG tablet Take 220-440 mg by mouth daily as needed (pain). Patient not taking: Reported on 08/24/2020    [provider]  rosuvastatin (CRESTOR) 10 MG tablet Take 5 mg by mouth every evening.    [provider]     Allergies:    No Known Allergies   Physical Exam:   Vitals  Blood pressure (!) 138/91, pulse 77, temperature 97.6 F (36.4 C), temperature source Oral, resp. rate 17, SpO2 100 %.  1.  General: Patient lying supine in bed,  no acute distress   2. Psychiatric: Alert and oriented x 3, flat affect and behavior normal for situation, pleasant and cooperative with exam   3. Neurologic: Speech and language are normal, voice is hoarse, face is symmetric, moves all 4 extremities voluntarily, at baseline without acute deficits on limited exam   4. HEENMT:  Head is atraumatic, normocephalic, pupils reactive to light, neck is cachectic, voice is hoarse, trachea is midline, mucous membranes are moist   5. Respiratory : Diminished breath sounds on the right, no rhonchi, no rales, left lung fields clear without rhonchi, rales, no cyanosis, no increase in work of breathing or accessory muscle use   6. Cardiovascular : Heart rate normal, rhythm is regular, no murmurs, rubs or gallops, no peripheral edema, peripheral pulses palpated   7. Gastrointestinal:  Abdomen is soft, nondistended, nontender to palpation bowel sounds active, no masses or organomegaly palpated   8. Skin:  Skin is warm, dry and intact without rashes, acute lesions, or ulcers on limited exam   9.Musculoskeletal:  No acute deformities or trauma, no asymmetry in tone, no peripheral edema, peripheral pulses  palpated, no tenderness to palpation in the extremities     Data Review:    CBC Recent Labs  Lab 01/08/21 1532  WBC 22.9*  HGB 11.8*  HCT 38.0*  PLT 563*  MCV 87.2  MCH 27.1  MCHC 31.1  RDW 15.9*  LYMPHSABS 0.8  MONOABS 1.1*  EOSABS 0.2  BASOSABS 0.1   ------------------------------------------------------------------------------------------------------------------  Results for orders placed or performed during the hospital encounter of 01/08/21 (from the past 48 hour(s))  CBC with Differential     Status: Abnormal   Collection Time: 01/08/21  3:32 PM  Result Value Ref Range   WBC 22.9 (H) 4.0 - 10.5 K/uL   RBC 4.36 4.22 - 5.81 MIL/uL   Hemoglobin 11.8 (L) 13.0 - 17.0 g/dL   HCT 38.0 (L) 39.0 - 52.0 %   MCV 87.2 80.0 - 100.0 fL   MCH 27.1 26.0 - 34.0 pg   MCHC 31.1 30.0 - 36.0 g/dL   RDW 15.9 (H) 11.5 - 15.5 %   Platelets 563 (H) 150 - 400 K/uL   nRBC 0.0 0.0 - 0.2 %   Neutrophils Relative % 90 %   Neutro Abs 20.6 (H) 1.7 - 7.7 K/uL   Lymphocytes Relative 3 %   Lymphs Abs 0.8 0.7 - 4.0 K/uL   Monocytes Relative 5 %   Monocytes Absolute 1.1 (H) 0.1 - 1.0 K/uL   Eosinophils Relative 1 %   Eosinophils Absolute 0.2 0.0 - 0.5 K/uL   Basophils Relative 0 %   Basophils Absolute 0.1 0.0 - 0.1 K/uL   Immature Granulocytes 1 %   Abs Immature Granulocytes 0.18 (H) 0.00 - 0.07 K/uL    Comment: Performed at [facility], [address] [place], [place] [address]  Comprehensive metabolic panel     Status: Abnormal   Collection Time: 01/08/21  3:32 PM  Result Value Ref Range   Sodium 136 135 - 145 mmol/L   Potassium 3.7 3.5 - 5.1 mmol/L   Chloride 101 98 - 111 mmol/L   CO2 28 22 - 32 mmol/L   Glucose, Bld 93 70 - 99 mg/dL    Comment: Glucose reference range applies only to samples taken after fasting for at least 8 hours.   BUN 28 (H) 8 - 23 mg/dL   Creatinine, Ser 0.73 0.61 - 1.24 mg/dL   Calcium 10.7 (H) 8.9 - 10.3 mg/dL   Total Protein 5.7 (L) 6.5 - 8.1 g/dL    Albumin 2.2 (L) 3.5 - 5.0 g/dL   AST 19 15 - 41 U/L   ALT 22 0 - 44 U/L   Alkaline Phosphatase 175 (H) 38 - 126 U/L   Total Bilirubin 1.3 (H) 0.3 - 1.2 mg/dL   GFR, Estimated >60 >60 mL/min    Comment: (NOTE) Calculated using the CKD-EPI Creatinine Equation (2021)    Anion gap 7 5 - 15    Comment: Performed at [facility], [address] [place], [place] [address]  Lipase, blood     Status: None   Collection Time: 01/08/21  3:32 PM  Result Value Ref Range   Lipase 24 11 - 51 U/L    Comment: Performed at [facility], [address] [place], [place] [address]  Urinalysis, Routine w reflex microscopic Urine, Clean Catch     Status: None   Collection Time: 01/08/21  4:15 PM  Result Value Ref Range   Color, Urine YELLOW YELLOW   APPearance CLEAR CLEAR   Specific Gravity, Urine 1.025 1.005 - 1.030   pH 6.0 5.0 - 8.0   Glucose, UA NEGATIVE NEGATIVE mg/dL   Hgb urine dipstick NEGATIVE NEGATIVE   Bilirubin Urine NEGATIVE NEGATIVE   Ketones, ur NEGATIVE NEGATIVE mg/dL   Protein, ur NEGATIVE NEGATIVE mg/dL   Nitrite NEGATIVE NEGATIVE   Leukocytes,Ua NEGATIVE NEGATIVE    Comment: Microscopic not done on urines with negative protein, blood, leukocytes, nitrite, or glucose < 500 mg/dL. Performed at [facility], [address] [place], [place] [address]     Chemistries  Recent Labs  Lab 01/08/21 1532  NA 136  K 3.7  CL 101  CO2 28  GLUCOSE 93  BUN 28*  CREATININE 0.73  CALCIUM 10.7*  AST 19  ALT 22  ALKPHOS 175*  BILITOT 1.3*   ------------------------------------------------------------------------------------------------------------------  ------------------------------------------------------------------------------------------------------------------ GFR: CrCl cannot be calculated (Unknown ideal weight.). Liver Function Tests: Recent Labs  Lab 01/08/21 1532  AST 19  ALT 22  ALKPHOS 175*  BILITOT 1.3*  PROT 5.7*  ALBUMIN 2.2*   Recent Labs  Lab  01/08/21 1532  LIPASE 24   No results for input(s): AMMONIA in the last 168 hours. Coagulation Profile: No results for input(s): INR, PROTIME in the last 168 hours. Cardiac Enzymes: No results for input(s): CKTOTAL, CKMB, CKMBINDEX, TROPONINI in the last 168 hours. BNP (last 3 results) No results for input(s): PROBNP in the last 8760 hours. HbA1C: No results for input(s): HGBA1C in the last 72 hours. CBG: No results for input(s): GLUCAP in the last 168 hours. Lipid Profile: No results for input(s): CHOL, HDL, LDLCALC, TRIG, CHOLHDL, LDLDIRECT in the last 72 hours. Thyroid Function Tests: No results for input(s): TSH, T4TOTAL, FREET4, T3FREE, THYROIDAB in the last 72 hours. Anemia Panel: No results for input(s): VITAMINB12, FOLATE, FERRITIN, TIBC, IRON, RETICCTPCT in the last 72 hours.  --------------------------------------------------------------------------------------------------------------- Urine analysis:    Component Value Date/Time   COLORURINE YELLOW 01/08/2021 1615   APPEARANCEUR CLEAR 01/08/2021 1615   APPEARANCEUR Clear 08/09/2020 1457   LABSPEC 1.025 01/08/2021 1615   PHURINE 6.0 01/08/2021 1615   GLUCOSEU NEGATIVE 01/08/2021 1615   HGBUR NEGATIVE 01/08/2021 1615   BILIRUBINUR NEGATIVE 01/08/2021 1615   BILIRUBINUR Negative 08/09/2020 [place] 01/08/2021 1615   PROTEINUR NEGATIVE 01/08/2021 1615   NITRITE NEGATIVE 01/08/2021 1615   LEUKOCYTESUR NEGATIVE 01/08/2021 1615      Imaging Results:    CT Angio Chest PE W and/or Wo Contrast  Result Date: 01/08/2021 CLINICAL DATA:  Chest pain.  High probability for PE. EXAM: CT ANGIOGRAPHY CHEST WITH CONTRAST TECHNIQUE: Multidetector CT imaging of the chest was performed using the standard protocol during bolus administration of intravenous contrast. Multiplanar CT image reconstructions and MIPs were obtained to evaluate the vascular anatomy. CONTRAST:  114m OMNIPAQUE IOHEXOL 350 MG/ML SOLN  COMPARISON:  Prior CT scan of the chest 04/05/2020 FINDINGS: Cardiovascular: Satisfactory opacification of the pulmonary arteries to the segmental level. No evidence of pulmonary embolism. Normal heart size. No pericardial effusion. Fusiform aneurysmal dilation of the ascending thoracic aorta with a maximal transverse diameter of 4.5 cm. Scattered atherosclerotic calcifications throughout the aorta and the native coronary arteries. Mediastinum/Nodes: Unremarkable thyroid gland. High right paratracheal lymph node measures  1.4 cm in short axis (image 22 series 4). Left paratracheal lymph node measures 1.5 cm in short axis (image 34 series 4). Subcarinal lymphadenopathy measures approximately 2.2 cm in short axis (image 42 series 4). Posterior left mediastinal mass measures a proximally 4.5 x 2.8 x 3.8 cm indirectly invades the head and neck of the left third rib. The mass also extends into the neural foramen. Lungs/Pleura: Approximally 9.0 x 8.0 x 8.7 cm ill-defined heterogeneous mass centered in the posteromedial aspect of the right lower lobe contains numerous punctate internal calcifications. The mass also appears to extend across the diaphragm and directly into segment 7 of the liver. There is a small and likely malignant associated right pleural effusion. 0.6 cm right middle lobe pulmonary nodule (image 73 series 6) is indeterminate. 2 mm nodule more superiorly on image 67 also noted. Peripheral subpleural reticulation, architectural distortion and honeycombing in the left lung base suggests underlying pulmonary fibrosis. Upper Abdomen: No acute abnormality. Musculoskeletal: Soft tissue lytic lesion destroying the head and neck of the left third rib with extension into the neural foramen as described above. No additional foci of osseous metastatic disease identified. Review of the MIP images confirms the above findings. IMPRESSION: 1. Approximally 9 cm ill-defined heterogeneous right lower lobe mass appears to  cross the diaphragm and directly invade segment 7 of the liver. Findings are highly concerning for advanced primary bronchogenic carcinoma. 2. Lytic soft tissue metastasis in the left posterior mediastinum centered on the head and neck of the left third rib. The mass encroaches into the left T3-T4 neural foramen. 3. Presumably metastatic mediastinal lymphadenopathy. 4. Small right-sided pleural effusion is likely malignant. 5. Nonspecific right middle lobe pulmonary nodules measuring 0.6 and 0.2 cm. Small pulmonary metastases versus incidental benign nodules. 6. 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 7. 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 Ascending thoracic aortic aneurysm. Recommend semi-annual imaging followup by CTA or MRA and referral to cardiothoracic surgery if not already obtained. This recommendation follows 2010 ACCF/AHA/AATS/ACR/ASA/SCA/SCAI/SIR/STS/SVM Guidelines for the Diagnosis and Management of Patients With Thoracic Aortic Disease. Circulation. 2010; 121: H852-D782. Aortic aneurysm NOS (ICD10-I71.9); Aortic Atherosclerosis (ICD10-I70.0). 8. Coronary artery calcifications. Electronically Signed   By: Jacqulynn Cadet M.D.   On: 01/08/2021 17:09   CT ABDOMEN PELVIS W CONTRAST  Result Date: 01/08/2021 CLINICAL DATA:  Left lower quadrant pain EXAM: CT ABDOMEN AND PELVIS WITH CONTRAST TECHNIQUE: Multidetector CT imaging of the abdomen and pelvis was performed using the standard protocol following bolus administration of intravenous contrast. CONTRAST:  184m OMNIPAQUE IOHEXOL 350 MG/ML SOLN COMPARISON:  CT 01/19/2020, chest CT 01/08/2021, FINDINGS: Lower chest: Mild aneurysmal dilatation of the ascending aorta measuring up to 4.4 cm, heavily calcified. Extensive coronary vascular calcification. Normal cardiac size. Trace pericardial effusion. Incompletely visualized necrotic appearing subcarinal lymph node measuring up to 2.6 cm. Large  necrotic appearing mass in the medial right base with punctate calcifications, this measures approximately 9.6 by 7.8 cm and extends across the right diaphragm, into the adjacent liver. Thick rimmed hypodense enhancing lesion in the right hepatic lobe measuring 3.8 cm. Small right-sided pleural effusion Hepatobiliary: No calcified gallstone. No biliary dilatation. Heterogenous enhancement of right hepatic lobe adjacent to mass lesion. Pancreas: Unremarkable. No pancreatic ductal dilatation or surrounding inflammatory changes. Spleen: Indeterminate hypoenhancing mass within the spleen measuring 2.4 cm, series 2, image 34. Adrenals/Urinary Tract: Adrenal glands are normal. Punctate nonobstructing stones within the bilateral kidneys. Scattered renal cysts. Interim finding of 14 mm  indeterminate right renal lesion at the midpole, series 2, image 52. Interim finding of indeterminate 13 mm exophytic lesion mid pole left kidney, series 2, image 44. Slightly thick-walled urinary bladder. 4 mm stone within the right posterior bladder. Stomach/Bowel: The stomach is nonenlarged. No dilated small bowel. No acute bowel wall thickening. Moderate to large stool in the rectosigmoid colon with moderate formed feces at the rectum. Negative appendix Vascular/Lymphatic: Advanced aortic atherosclerosis. No aneurysm. No suspicious nodes. Reproductive: Post treatment changes of the prostate gland Other: Presacral edema and soft tissue stranding.  No free air. Musculoskeletal: Lytic lesion and soft tissue mass involving the L1 vertebra right pedicle, lamina, vertebral body and transverse process. Soft tissue mass involves the paraspinal soft tissues as well. 2.5 cm hypodense lesion in the right paraspinal soft tissues posterior to right transverse process at L2, series 2, image 49. Large lytic lesion L5 vertebral body. Chronic bilateral pars defect at L5. 5.5 x 4.3 cm enhancing heterogenous soft tissue mass in the left gluteus muscles,  series 2, image 93. IMPRESSION: 1. Small right-sided pleural effusion with large necrotic appearing mass in the posteromedial right lower lobe that appears to traverse the right diaphragm and extends into the adjacent liver. 2. Lytic lesion with associated soft tissue mass at L1 with additional lytic lesion at L5 vertebral body consistent with metastatic disease. 5.5 cm heterogenous enhancing soft tissue mass in the left gluteus muscles also suspect for metastatic disease. Small hypodense right paraspinal soft tissue mass posterior to transverse process of L2, concerning for metastatic disease 3. Vague hypodensity within the mid spleen is indeterminate for infarct or hypodense mass lesion. 4. Interval development of bilateral indeterminate renal lesions measuring up to 13 mm. 5. Nonobstructing kidney stones.  4 mm stone in the bladder Electronically Signed   By: Donavan Foil M.D.   On: 01/08/2021 17:39   CT L-SPINE NO CHARGE  Result Date: 01/08/2021 CLINICAL DATA:  Back pain. EXAM: CT LUMBAR SPINE WITH CONTRAST TECHNIQUE: Technique: Multiplanar CT images of the lumbar spine were reconstructed from contemporary CT of the Abdomen and Pelvis. CONTRAST:  No additional COMPARISON:  CT abdomen and pelvis 01/19/2020 FINDINGS: Segmentation: 5 lumbar type vertebrae. Alignment: Normal. Vertebrae: Chronic bilateral L5 pars defects. 5 cm destructive mass involving the right-sided posterior elements and posterior vertebral body of L1 mildly encroaching upon the right L1-2 neural foramen. 3.7 cm lytic lesion in the left aspect of the L5 vertebral body with focal disruption of the posterior vertebral body cortex and likely small volume epidural tumor in the left lateral recess. Preserved vertebral body heights. Paraspinal and other soft tissues: 2.5 cm hypodense mass in the paraspinal soft tissues posterior to the right L2 transverse process. Intra-abdominal and pelvic contents reported separately. Disc levels: Mild lumbar  spondylosis and facet arthrosis without high-grade spinal stenosis. Mild-to-moderate multilevel neural foraminal stenosis. IMPRESSION: 1. Destructive bone lesions at L1 and L5 consistent with metastatic disease. Suspected small volume epidural tumor in the left lateral recess at L5. 2. 2.5 cm paraspinal soft tissue mass posterior to the right L2 transverse process also concerning for metastatic disease. Electronically Signed   By: Logan Bores M.D.   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[place])   CAP (community acquired pneumonia)   Malignant pleural effusion   Constipation   Hypercalcemia   Protein-calorie malnutrition, moderate (HCC)   Acute respiratory failure  with hypoxia Secondary to lung malignancy and likely postobstructive pneumonia Treat as below Wean off O2 as tolerated -May need home oxygen arranged Albuterol as needed CTA does not show PE Continue to monitor Newly discovered metastatic disease Known lung mass from July, family indicates that they were not aware Now with remember, spine, spleen, renal involvement Consult palliative, consult oncology Continue to monitor Hypercalcemia Calcium 10.7, corrects to 12.1 Fluids given in the ED, continue fluids Recheck in a.m. 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 intake Ensure Enlive  supplementation Continue to monitor Hyperlipidemia Continue rosuvastatin Hypertension Continue lisinopril    DVT Prophylaxis-   Heparin- SCDs   AM Labs Ordered, also please review Full Orders  Family Communication: Admission, patients condition and plan of care including tests being ordered have been discussed with the patient and daughter who indicate understanding and agree with the plan and Code Status.  Code Status: Full  Admission status: Observation Time spent in minutes : [place]

## 2021-01-08 NOTE — ED Provider Notes (Signed)
[facility] EMERGENCY DEPARTMENT Provider Note   CSN: [id number] Arrival date & time: 01/08/21  1406     History Chief Complaint  Patient presents with   Constipation    Cody Carr is a 75 y.o. male.  HPI   Pt is a 75 y/o male with a h/o dyspnea, nephrolithiasis, hld, htn, prostate ca, throat ca who presents to the ED today for eval of constipation. He states he has been constipated for the last 8 days. Denies abd pain. Reports one episode of vomiting. Denies urinary sxs or fevers.   Spoke with patient's daughter who assists with the history.  She states the patient was admitted last week at an outside hospital for evaluation of constipation and dehydration.  He had an x-ray which confirmed this.  He did not have a CT scan at that time.  He was started on MiraLAX and later had loose stools that he was tested for C. difficile.  This test was negative.  He felt better after hydration and was discharged home in stable condition however he started taking some pain medications for back pain after having an injury a few weeks ago and now he is constipated again and concern for dehydration.    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    Past Surgical History:  Procedure Laterality Date   dental implants  2008   EXTRACORPOREAL SHOCK WAVE LITHOTRIPSY Right 03/06/2020   Procedure: EXTRACORPOREAL SHOCK WAVE LITHOTRIPSY (ESWL);  Surgeon: Cleon Gustin, MD;  Location: [facility];  Service: Urology;  Laterality: Right;   EYE SURGERY Bilateral 2021   ioc lens for cataracts   RADIOACTIVE SEED IMPLANT N/A 07/27/2020   Procedure:  RADIOACTIVE SEED IMPLANT/BRACHYTHERAPY IMPLANT;  Surgeon: Irine Seal, MD;  Location: [facility];  Service: Urology;  Laterality: N/A;  63 SEEDS   SPACE OAR INSTILLATION N/A 07/27/2020   Procedure: SPACE OAR INSTILLATION;  Surgeon: Irine Seal, MD;  [facility];  Service: Urology;  Laterality: N/A;   throat biopsy  2008       Family History  Problem Relation Age of Onset   Prostate cancer Neg Hx    Breast cancer Neg Hx    Colon cancer Neg Hx    Pancreatic cancer Neg Hx     Social History   Tobacco Use   Smoking status: Former    Packs/day: 0.50    Years: 12.00    Pack years: 6.00    Types: Cigarettes    Quit date: 1980    Years since quitting: 42.7   Smokeless tobacco: Never  Vaping Use   Vaping Use: Never used  Substance Use Topics   Alcohol use: Not Currently   Drug use: Never    Home Medications Prior to Admission medications   Medication Sig Start Date End Date Taking? Authorizing Provider  Biotin 5000 MCG CAPS Take 5,000 mcg by mouth daily.    [provider]  Cholecalciferol (VITAMIN D3) 50 MCG (2000 UT) TABS Take 2,000 Units by mouth daily.  [provider]  Cyanocobalamin (VITAMIN B-12) 5000 MCG SUBL Take 5,000 mcg by mouth daily.    [provider]  HYDROcodone-acetaminophen (NORCO/VICODIN) 5-325 MG tablet Take 1 tablet by mouth every 6 (six) hours as needed for moderate pain. Patient not taking: Reported on 08/24/2020 07/27/20 07/27/21  Irine Seal, MD  lisinopril (ZESTRIL) 2.5 MG tablet Take 2.5 mg by mouth every evening.     [provider]  Multiple Vitamin (MULTIVITAMIN WITH MINERALS) TABS tablet Take 1 tablet by mouth daily.    [provider]  naproxen sodium (ALEVE) 220 MG tablet Take 220-440 mg by mouth daily as needed (pain). Patient not taking: Reported on 08/24/2020    [provider]  rosuvastatin (CRESTOR) 10 MG tablet Take 5 mg by mouth every evening.     [provider]    Allergies    Patient has no known allergies.  Review of Systems   Review of Systems  Constitutional:  Negative for fever.  HENT:  Negative for ear pain and sore throat.   Eyes:  Negative for visual disturbance.  Respiratory:  Negative for cough and shortness of breath.   Cardiovascular:  Negative for chest pain.  Gastrointestinal:  Positive for abdominal pain, constipation, nausea and vomiting. Negative for blood in stool and diarrhea.  Genitourinary:  Negative for dysuria and hematuria.  Musculoskeletal:  Negative for back pain.  Skin:  Negative for color change and rash.  Neurological:  Negative for headaches.  All other systems reviewed and are negative.  Physical Exam Updated Vital Signs BP (!) 138/91   Pulse 77   Temp 97.6 F (36.4 C) (Oral)   Resp 17   SpO2 100%   Physical Exam Vitals and nursing note reviewed.  Constitutional:      Appearance: He is well-developed.  HENT:     Head: Normocephalic and atraumatic.  Eyes:     Conjunctiva/sclera: Conjunctivae normal.  Cardiovascular:     Rate and Rhythm: Normal rate and regular rhythm.     Heart sounds: Normal heart sounds. No murmur heard. Pulmonary:     Effort: Pulmonary effort is normal. No respiratory distress.     Breath sounds: Normal breath sounds. No wheezing or rales.  Abdominal:     General: Bowel sounds are normal.     Palpations: Abdomen is soft.     Tenderness: There is no abdominal tenderness. There is no guarding or rebound.  Musculoskeletal:     Cervical back: Neck supple.     Right lower leg: No edema.     Left lower leg: No edema.  Skin:    General: Skin is warm and dry.  Neurological:     Mental Status: He is alert.     Comments: Normal sensation to the ble, mild weakness to the left leg compared to right (pain limits exam)    ED Results / Procedures / Treatments   Labs (all labs ordered are listed, but only abnormal results are displayed) Labs Reviewed   CBC WITH DIFFERENTIAL/PLATELET - Abnormal; Notable for the following components:      Result Value   WBC 22.9 (*)    Hemoglobin 11.8 (*)    HCT 38.0 (*)    RDW 15.9 (*)    Platelets 563 (*)    Neutro Abs 20.6 (*)    Monocytes Absolute 1.1 (*)    Abs Immature Granulocytes 0.18 (*)    All other components within normal limits  COMPREHENSIVE METABOLIC PANEL - Abnormal; Notable for  the following components:   BUN 28 (*)    Calcium 10.7 (*)    Total Protein 5.7 (*)    Albumin 2.2 (*)    Alkaline Phosphatase 175 (*)    Total Bilirubin 1.3 (*)    All other components within normal limits  LIPASE, BLOOD  URINALYSIS, ROUTINE W REFLEX MICROSCOPIC    EKG None  Radiology CT Angio Chest PE W and/or Wo Contrast  Result Date: 01/08/2021 CLINICAL DATA:  Chest pain.  High probability for PE. EXAM: CT ANGIOGRAPHY CHEST WITH CONTRAST TECHNIQUE: Multidetector CT imaging of the chest was performed using the standard protocol during bolus administration of intravenous contrast. Multiplanar CT image reconstructions and MIPs were obtained to evaluate the vascular anatomy. CONTRAST:  129mL OMNIPAQUE IOHEXOL 350 MG/ML SOLN COMPARISON:  Prior CT scan of the chest 04/05/2020 FINDINGS: Cardiovascular: Satisfactory opacification of the pulmonary arteries to the segmental level. No evidence of pulmonary embolism. Normal heart size. No pericardial effusion. Fusiform aneurysmal dilation of the ascending thoracic aorta with a maximal transverse diameter of 4.5 cm. Scattered atherosclerotic calcifications throughout the aorta and the native coronary arteries. Mediastinum/Nodes: Unremarkable thyroid gland. High right paratracheal lymph node measures 1.4 cm in short axis (image 22 series 4). Left paratracheal lymph node measures 1.5 cm in short axis (image 34 series 4). Subcarinal lymphadenopathy measures approximately 2.2 cm in short axis (image 42 series 4). Posterior left mediastinal mass measures a proximally 4.5 x 2.8  x 3.8 cm indirectly invades the head and neck of the left third rib. The mass also extends into the neural foramen. Lungs/Pleura: Approximally 9.0 x 8.0 x 8.7 cm ill-defined heterogeneous mass centered in the posteromedial aspect of the right lower lobe contains numerous punctate internal calcifications. The mass also appears to extend across the diaphragm and directly into segment 7 of the liver. There is a small and likely malignant associated right pleural effusion. 0.6 cm right middle lobe pulmonary nodule (image 73 series 6) is indeterminate. 2 mm nodule more superiorly on image 67 also noted. Peripheral subpleural reticulation, architectural distortion and honeycombing in the left lung base suggests underlying pulmonary fibrosis. Upper Abdomen: No acute abnormality. Musculoskeletal: Soft tissue lytic lesion destroying the head and neck of the left third rib with extension into the neural foramen as described above. No additional foci of osseous metastatic disease identified. Review of the MIP images confirms the above findings. IMPRESSION: 1. Approximally 9 cm ill-defined heterogeneous right lower lobe mass appears to cross the diaphragm and directly invade segment 7 of the liver. Findings are highly concerning for advanced primary bronchogenic carcinoma. 2. Lytic soft tissue metastasis in the left posterior mediastinum centered on the head and neck of the left third rib. The mass encroaches into the left T3-T4 neural foramen. 3. Presumably metastatic mediastinal lymphadenopathy. 4. Small right-sided pleural effusion is likely malignant. 5. Nonspecific right middle lobe pulmonary nodules measuring 0.6 and 0.2 cm. Small pulmonary metastases versus incidental benign nodules. 6. 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 7. 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 Ascending thoracic aortic aneurysm. Recommend semi-annual imaging followup by CTA or MRA and  referral to cardiothoracic surgery if not already obtained. This recommendation follows 2010 ACCF/AHA/AATS/ACR/ASA/SCA/SCAI/SIR/STS/SVM Guidelines for the Diagnosis and Management of Patients With Thoracic Aortic Disease. Circulation. 2010; 121: F643-P295. Aortic aneurysm NOS (ICD10-I71.9); Aortic Atherosclerosis (ICD10-I70.0). 8. Coronary artery calcifications. Electronically Signed   By: Jacqulynn Cadet M.D.   On: 01/08/2021 17:09   CT ABDOMEN PELVIS  W CONTRAST  Result Date: 01/08/2021 CLINICAL DATA:  Left lower quadrant pain EXAM: CT ABDOMEN AND PELVIS WITH CONTRAST TECHNIQUE: Multidetector CT imaging of the abdomen and pelvis was performed using the standard protocol following bolus administration of intravenous contrast. CONTRAST:  185mL OMNIPAQUE IOHEXOL 350 MG/ML SOLN COMPARISON:  CT 01/19/2020, chest CT 01/08/2021, FINDINGS: Lower chest: Mild aneurysmal dilatation of the ascending aorta measuring up to 4.4 cm, heavily calcified. Extensive coronary vascular calcification. Normal cardiac size. Trace pericardial effusion. Incompletely visualized necrotic appearing subcarinal lymph node measuring up to 2.6 cm. Large necrotic appearing mass in the medial right base with punctate calcifications, this measures approximately 9.6 by 7.8 cm and extends across the right diaphragm, into the adjacent liver. Thick rimmed hypodense enhancing lesion in the right hepatic lobe measuring 3.8 cm. Small right-sided pleural effusion Hepatobiliary: No calcified gallstone. No biliary dilatation. Heterogenous enhancement of right hepatic lobe adjacent to mass lesion. Pancreas: Unremarkable. No pancreatic ductal dilatation or surrounding inflammatory changes. Spleen: Indeterminate hypoenhancing mass within the spleen measuring 2.4 cm, series 2, image 34. Adrenals/Urinary Tract: Adrenal glands are normal. Punctate nonobstructing stones within the bilateral kidneys. Scattered renal cysts. Interim finding of 14 mm indeterminate  right renal lesion at the midpole, series 2, image 52. Interim finding of indeterminate 13 mm exophytic lesion mid pole left kidney, series 2, image 44. Slightly thick-walled urinary bladder. 4 mm stone within the right posterior bladder. Stomach/Bowel: The stomach is nonenlarged. No dilated small bowel. No acute bowel wall thickening. Moderate to large stool in the rectosigmoid colon with moderate formed feces at the rectum. Negative appendix Vascular/Lymphatic: Advanced aortic atherosclerosis. No aneurysm. No suspicious nodes. Reproductive: Post treatment changes of the prostate gland Other: Presacral edema and soft tissue stranding.  No free air. Musculoskeletal: Lytic lesion and soft tissue mass involving the L1 vertebra right pedicle, lamina, vertebral body and transverse process. Soft tissue mass involves the paraspinal soft tissues as well. 2.5 cm hypodense lesion in the right paraspinal soft tissues posterior to right transverse process at L2, series 2, image 49. Large lytic lesion L5 vertebral body. Chronic bilateral pars defect at L5. 5.5 x 4.3 cm enhancing heterogenous soft tissue mass in the left gluteus muscles, series 2, image 93. IMPRESSION: 1. Small right-sided pleural effusion with large necrotic appearing mass in the posteromedial right lower lobe that appears to traverse the right diaphragm and extends into the adjacent liver. 2. Lytic lesion with associated soft tissue mass at L1 with additional lytic lesion at L5 vertebral body consistent with metastatic disease. 5.5 cm heterogenous enhancing soft tissue mass in the left gluteus muscles also suspect for metastatic disease. Small hypodense right paraspinal soft tissue mass posterior to transverse process of L2, concerning for metastatic disease 3. Vague hypodensity within the mid spleen is indeterminate for infarct or hypodense mass lesion. 4. Interval development of bilateral indeterminate renal lesions measuring up to 13 mm. 5. Nonobstructing  kidney stones.  4 mm stone in the bladder Electronically Signed   By: Donavan Foil M.D.   On: 01/08/2021 17:39   CT L-SPINE NO CHARGE  Result Date: 01/08/2021 CLINICAL DATA:  Back pain. EXAM: CT LUMBAR SPINE WITH CONTRAST TECHNIQUE: Technique: Multiplanar CT images of the lumbar spine were reconstructed from contemporary CT of the Abdomen and Pelvis. CONTRAST:  No additional COMPARISON:  CT abdomen and pelvis 01/19/2020 FINDINGS: Segmentation: 5 lumbar type vertebrae. Alignment: Normal. Vertebrae: Chronic bilateral L5 pars defects. 5 cm destructive mass involving the right-sided posterior elements and posterior vertebral body  of L1 mildly encroaching upon the right L1-2 neural foramen. 3.7 cm lytic lesion in the left aspect of the L5 vertebral body with focal disruption of the posterior vertebral body cortex and likely small volume epidural tumor in the left lateral recess. Preserved vertebral body heights. Paraspinal and other soft tissues: 2.5 cm hypodense mass in the paraspinal soft tissues posterior to the right L2 transverse process. Intra-abdominal and pelvic contents reported separately. Disc levels: Mild lumbar spondylosis and facet arthrosis without high-grade spinal stenosis. Mild-to-moderate multilevel neural foraminal stenosis. IMPRESSION: 1. Destructive bone lesions at L1 and L5 consistent with metastatic disease. Suspected small volume epidural tumor in the left lateral recess at L5. 2. 2.5 cm paraspinal soft tissue mass posterior to the right L2 transverse process also concerning for metastatic disease. Electronically Signed   By: Logan Bores M.D.   On: 01/08/2021 18:16    Procedures Procedures   Medications Ordered in ED Medications  cefTRIAXone (ROCEPHIN) 1 g in sodium chloride 0.9 % 100 mL IVPB (has no administration in time range)  azithromycin (ZITHROMAX) 500 mg in sodium chloride 0.9 % 250 mL IVPB (has no administration in time range)  morphine 4 MG/ML injection 4 mg (has no  administration in time range)  0.9 %  sodium chloride infusion (has no administration in time range)  morphine 4 MG/ML injection 4 mg (4 mg Intravenous Given 01/08/21 1545)  ondansetron (ZOFRAN) injection 4 mg (4 mg Intravenous Given 01/08/21 1547)  sodium chloride 0.9 % bolus 1,000 mL (0 mLs Intravenous Stopped 01/08/21 1815)  iohexol (OMNIPAQUE) 350 MG/ML injection 100 mL (100 mLs Intravenous Contrast Given 01/08/21 1650)    ED Course  I have reviewed the triage vital signs and the nursing notes.  Pertinent labs & imaging results that were available during my care of the patient were reviewed by me and considered in my medical decision making (see chart for details).    MDM Rules/Calculators/A&P                       75 year old male presenting the emergency department today for evaluation of constipation, decreased p.o. intake and concern for dehydration.  Reviewed/interpreted labs CBC with a leukocytosis of 22,000 with mild anemia and elevated platelets at 563 CMP shows an elevated BUN at 28, low protein and albumin. Lipase is negative UA negative  Reviewed/interpreted imaging CT chest - 1. Approximally 9 cm ill-defined heterogeneous right lower lobe mass appears to cross the diaphragm and directly invade segment 7 of the liver. Findings are highly concerning for advanced primary bronchogenic carcinoma. 2. Lytic soft tissue metastasis in the left posterior mediastinum centered on the head and neck of the left third rib. The mass encroaches into the left T3-T4 neural foramen. 3. Presumably metastatic mediastinal lymphadenopathy. 4. Small right-sided pleural effusion is likely malignant. 5. Nonspecific right middle lobe pulmonary nodules measuring 0.6 and 0.2 cm. Small pulmonary metastases versus incidental benign nodules. 6. 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 7. 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 Ascending thoracic aortic  aneurysm.  Aortic Atherosclerosis (ICD10-I70.0). 8. Coronary artery calcifications. CT abd/pelvis - 1. Small right-sided pleural effusion with large necrotic appearing mass in the posteromedial right lower lobe that appears to traverse the right diaphragm and extends into the adjacent liver. 2. Lytic lesion with associated soft tissue mass at L1 with additional lytic lesion at L5 vertebral body consistent with metastatic disease. 5.5 cm heterogenous enhancing soft tissue mass  in the left gluteus muscles also suspect for metastatic disease. Small hypodense right paraspinal soft tissue mass posterior to transverse process of L2, concerning for metastatic disease 3. Vague hypodensity within the mid spleen is indeterminate for infarct or hypodense mass lesion. 4. Interval development of bilateral indeterminate renal lesions measuring up to 13 mm. 5. Nonobstructing kidney stones.  4 mm stone in the bladder CT lumbar spine - 1. Destructive bone lesions at L1 and L5 consistent with metastatic disease. Suspected small volume epidural tumor in the left lateral recess at L5. 2. 2.5 cm paraspinal soft tissue mass posterior to the right L2 transverse process also concerning for metastatic disease.  Pt with newly diagnosed lung ca with mets. He is hypoxic here requiring o2. He also has a leukocytosis of 22k and findings of pneumonitis which we have treated with abx. Will place for admission for further treatment  7:27 PM CONSULT with Dr. Marijo File who accepts patient for admission    Final Clinical Impression(s) / ED Diagnoses Final diagnoses:  Back pain  Malignant neoplasm of right lung, unspecified part of lung ([place])  Acute respiratory failure with hypoxia [facility])    Rx / DC Orders ED Discharge Orders     None        Bishop Dublin 01/08/21 1950    Davonna Belling, MD 01/09/21 1326

## 2021-01-09 DIAGNOSIS — C3431 Malignant neoplasm of lower lobe, right bronchus or lung: Secondary | ICD-10-CM | POA: Diagnosis not present

## 2021-01-09 DIAGNOSIS — C3491 Malignant neoplasm of unspecified part of right bronchus or lung: Secondary | ICD-10-CM | POA: Diagnosis not present

## 2021-01-09 DIAGNOSIS — M4726 Other spondylosis with radiculopathy, lumbar region: Secondary | ICD-10-CM | POA: Diagnosis present

## 2021-01-09 DIAGNOSIS — K5900 Constipation, unspecified: Secondary | ICD-10-CM | POA: Diagnosis not present

## 2021-01-09 DIAGNOSIS — T402X5A Adverse effect of other opioids, initial encounter: Secondary | ICD-10-CM | POA: Diagnosis present

## 2021-01-09 DIAGNOSIS — J90 Pleural effusion, not elsewhere classified: Secondary | ICD-10-CM | POA: Diagnosis not present

## 2021-01-09 DIAGNOSIS — M545 Low back pain, unspecified: Secondary | ICD-10-CM | POA: Diagnosis not present

## 2021-01-09 DIAGNOSIS — N281 Cyst of kidney, acquired: Secondary | ICD-10-CM | POA: Diagnosis not present

## 2021-01-09 DIAGNOSIS — K222 Esophageal obstruction: Secondary | ICD-10-CM | POA: Diagnosis not present

## 2021-01-09 DIAGNOSIS — N210 Calculus in bladder: Secondary | ICD-10-CM | POA: Diagnosis not present

## 2021-01-09 DIAGNOSIS — M549 Dorsalgia, unspecified: Secondary | ICD-10-CM | POA: Diagnosis not present

## 2021-01-09 DIAGNOSIS — L89152 Pressure ulcer of sacral region, stage 2: Secondary | ICD-10-CM | POA: Diagnosis present

## 2021-01-09 DIAGNOSIS — I700 Atherosclerosis of aorta: Secondary | ICD-10-CM | POA: Diagnosis not present

## 2021-01-09 DIAGNOSIS — C787 Secondary malignant neoplasm of liver and intrahepatic bile duct: Secondary | ICD-10-CM | POA: Diagnosis present

## 2021-01-09 DIAGNOSIS — L8990 Pressure ulcer of unspecified site, unspecified stage: Secondary | ICD-10-CM | POA: Diagnosis not present

## 2021-01-09 DIAGNOSIS — R638 Other symptoms and signs concerning food and fluid intake: Secondary | ICD-10-CM | POA: Diagnosis not present

## 2021-01-09 DIAGNOSIS — Z66 Do not resuscitate: Secondary | ICD-10-CM | POA: Diagnosis not present

## 2021-01-09 DIAGNOSIS — M5124 Other intervertebral disc displacement, thoracic region: Secondary | ICD-10-CM | POA: Diagnosis not present

## 2021-01-09 DIAGNOSIS — E43 Unspecified severe protein-calorie malnutrition: Secondary | ICD-10-CM | POA: Diagnosis not present

## 2021-01-09 DIAGNOSIS — R0603 Acute respiratory distress: Secondary | ICD-10-CM | POA: Diagnosis present

## 2021-01-09 DIAGNOSIS — N200 Calculus of kidney: Secondary | ICD-10-CM | POA: Diagnosis not present

## 2021-01-09 DIAGNOSIS — D638 Anemia in other chronic diseases classified elsewhere: Secondary | ICD-10-CM | POA: Diagnosis present

## 2021-01-09 DIAGNOSIS — C7951 Secondary malignant neoplasm of bone: Secondary | ICD-10-CM

## 2021-01-09 DIAGNOSIS — J84112 Idiopathic pulmonary fibrosis: Secondary | ICD-10-CM | POA: Diagnosis present

## 2021-01-09 DIAGNOSIS — E785 Hyperlipidemia, unspecified: Secondary | ICD-10-CM | POA: Diagnosis present

## 2021-01-09 DIAGNOSIS — J9601 Acute respiratory failure with hypoxia: Secondary | ICD-10-CM | POA: Diagnosis not present

## 2021-01-09 DIAGNOSIS — E8809 Other disorders of plasma-protein metabolism, not elsewhere classified: Secondary | ICD-10-CM | POA: Diagnosis not present

## 2021-01-09 DIAGNOSIS — R109 Unspecified abdominal pain: Secondary | ICD-10-CM | POA: Diagnosis not present

## 2021-01-09 DIAGNOSIS — I10 Essential (primary) hypertension: Secondary | ICD-10-CM | POA: Diagnosis present

## 2021-01-09 DIAGNOSIS — W19XXXA Unspecified fall, initial encounter: Secondary | ICD-10-CM | POA: Diagnosis present

## 2021-01-09 DIAGNOSIS — Z515 Encounter for palliative care: Secondary | ICD-10-CM | POA: Diagnosis not present

## 2021-01-09 DIAGNOSIS — R911 Solitary pulmonary nodule: Secondary | ICD-10-CM | POA: Diagnosis not present

## 2021-01-09 DIAGNOSIS — C61 Malignant neoplasm of prostate: Secondary | ICD-10-CM | POA: Diagnosis present

## 2021-01-09 DIAGNOSIS — R079 Chest pain, unspecified: Secondary | ICD-10-CM | POA: Diagnosis not present

## 2021-01-09 DIAGNOSIS — J189 Pneumonia, unspecified organism: Secondary | ICD-10-CM | POA: Diagnosis not present

## 2021-01-09 DIAGNOSIS — K7689 Other specified diseases of liver: Secondary | ICD-10-CM | POA: Diagnosis not present

## 2021-01-09 DIAGNOSIS — C7989 Secondary malignant neoplasm of other specified sites: Secondary | ICD-10-CM | POA: Diagnosis not present

## 2021-01-09 DIAGNOSIS — Z431 Encounter for attention to gastrostomy: Secondary | ICD-10-CM | POA: Diagnosis not present

## 2021-01-09 DIAGNOSIS — E440 Moderate protein-calorie malnutrition: Secondary | ICD-10-CM | POA: Diagnosis not present

## 2021-01-09 DIAGNOSIS — R490 Dysphonia: Secondary | ICD-10-CM | POA: Diagnosis present

## 2021-01-09 DIAGNOSIS — E876 Hypokalemia: Secondary | ICD-10-CM | POA: Diagnosis not present

## 2021-01-09 DIAGNOSIS — Z7189 Other specified counseling: Secondary | ICD-10-CM | POA: Diagnosis not present

## 2021-01-09 DIAGNOSIS — Z961 Presence of intraocular lens: Secondary | ICD-10-CM | POA: Diagnosis present

## 2021-01-09 DIAGNOSIS — G959 Disease of spinal cord, unspecified: Secondary | ICD-10-CM | POA: Diagnosis not present

## 2021-01-09 DIAGNOSIS — I712 Thoracic aortic aneurysm, without rupture: Secondary | ICD-10-CM | POA: Diagnosis not present

## 2021-01-09 DIAGNOSIS — R1310 Dysphagia, unspecified: Secondary | ICD-10-CM | POA: Diagnosis not present

## 2021-01-09 DIAGNOSIS — K5903 Drug induced constipation: Secondary | ICD-10-CM | POA: Diagnosis not present

## 2021-01-09 DIAGNOSIS — G893 Neoplasm related pain (acute) (chronic): Secondary | ICD-10-CM | POA: Diagnosis not present

## 2021-01-09 DIAGNOSIS — J910 Malignant pleural effusion: Secondary | ICD-10-CM | POA: Diagnosis not present

## 2021-01-09 DIAGNOSIS — C799 Secondary malignant neoplasm of unspecified site: Secondary | ICD-10-CM | POA: Diagnosis not present

## 2021-01-09 DIAGNOSIS — Z20822 Contact with and (suspected) exposure to covid-19: Secondary | ICD-10-CM | POA: Diagnosis not present

## 2021-01-09 DIAGNOSIS — R918 Other nonspecific abnormal finding of lung field: Secondary | ICD-10-CM | POA: Diagnosis not present

## 2021-01-09 DIAGNOSIS — G319 Degenerative disease of nervous system, unspecified: Secondary | ICD-10-CM | POA: Diagnosis not present

## 2021-01-09 LAB — CBC WITH DIFFERENTIAL/PLATELET
Abs Immature Granulocytes: 0.22 10*3/uL — ABNORMAL HIGH (ref 0.00–0.07)
Basophils Absolute: 0.1 10*3/uL (ref 0.0–0.1)
Basophils Relative: 0 %
Eosinophils Absolute: 0.2 10*3/uL (ref 0.0–0.5)
Eosinophils Relative: 1 %
HCT: 35.6 % — ABNORMAL LOW (ref 39.0–52.0)
Hemoglobin: 10.7 g/dL — ABNORMAL LOW (ref 13.0–17.0)
Immature Granulocytes: 1 %
Lymphocytes Relative: 3 %
Lymphs Abs: 0.7 10*3/uL (ref 0.7–4.0)
MCH: 26.6 pg (ref 26.0–34.0)
MCHC: 30.1 g/dL (ref 30.0–36.0)
MCV: 88.3 fL (ref 80.0–100.0)
Monocytes Absolute: 1.1 10*3/uL — ABNORMAL HIGH (ref 0.1–1.0)
Monocytes Relative: 5 %
Neutro Abs: 19 10*3/uL — ABNORMAL HIGH (ref 1.7–7.7)
Neutrophils Relative %: 90 %
Platelets: 535 10*3/uL — ABNORMAL HIGH (ref 150–400)
RBC: 4.03 MIL/uL — ABNORMAL LOW (ref 4.22–5.81)
RDW: 15.8 % — ABNORMAL HIGH (ref 11.5–15.5)
WBC: 21.3 10*3/uL — ABNORMAL HIGH (ref 4.0–10.5)
nRBC: 0 % (ref 0.0–0.2)

## 2021-01-09 LAB — CBC
HCT: 34.6 % — ABNORMAL LOW (ref 39.0–52.0)
Hemoglobin: 10.5 g/dL — ABNORMAL LOW (ref 13.0–17.0)
MCH: 26.6 pg (ref 26.0–34.0)
MCHC: 30.3 g/dL (ref 30.0–36.0)
MCV: 87.8 fL (ref 80.0–100.0)
Platelets: 529 10*3/uL — ABNORMAL HIGH (ref 150–400)
RBC: 3.94 MIL/uL — ABNORMAL LOW (ref 4.22–5.81)
RDW: 15.8 % — ABNORMAL HIGH (ref 11.5–15.5)
WBC: 23.3 10*3/uL — ABNORMAL HIGH (ref 4.0–10.5)
nRBC: 0 % (ref 0.0–0.2)

## 2021-01-09 LAB — COMPREHENSIVE METABOLIC PANEL
ALT: 17 U/L (ref 0–44)
AST: 15 U/L (ref 15–41)
Albumin: 2 g/dL — ABNORMAL LOW (ref 3.5–5.0)
Alkaline Phosphatase: 157 U/L — ABNORMAL HIGH (ref 38–126)
Anion gap: 8 (ref 5–15)
BUN: 24 mg/dL — ABNORMAL HIGH (ref 8–23)
CO2: 28 mmol/L (ref 22–32)
Calcium: 10.6 mg/dL — ABNORMAL HIGH (ref 8.9–10.3)
Chloride: 107 mmol/L (ref 98–111)
Creatinine, Ser: 0.68 mg/dL (ref 0.61–1.24)
GFR, Estimated: >60 mL/min (ref >60)
Glucose, Bld: 85 mg/dL (ref 70–99)
Potassium: 3.6 mmol/L (ref 3.5–5.1)
Sodium: 143 mmol/L (ref 135–145)
Total Bilirubin: 1 mg/dL (ref 0.3–1.2)
Total Protein: 5.3 g/dL — ABNORMAL LOW (ref 6.5–8.1)

## 2021-01-09 LAB — SARS CORONAVIRUS 2 (TAT 6-24 HRS): SARS Coronavirus 2: NEGATIVE

## 2021-01-09 LAB — PROCALCITONIN: Procalcitonin: 0.3 ng/mL

## 2021-01-09 LAB — TSH: TSH: 1.854 u[IU]/mL (ref 0.350–4.500)

## 2021-01-09 LAB — HIV ANTIBODY (ROUTINE TESTING W REFLEX): HIV Screen 4th Generation wRfx: NONREACTIVE

## 2021-01-09 MED ORDER — SENNOSIDES-DOCUSATE SODIUM 8.6-50 MG PO TABS: 1.0000 | ORAL_TABLET | Freq: Every evening | ORAL | Status: DC | PRN

## 2021-01-09 MED ORDER — BISACODYL 10 MG RE SUPP: 10.0000 mg | Freq: Every day | RECTAL | Status: DC | PRN

## 2021-01-09 MED ORDER — SENNOSIDES-DOCUSATE SODIUM 8.6-50 MG PO TABS
2.0000 | ORAL_TABLET | Freq: Two times a day (BID) | ORAL | Status: DC
  Administered 2021-01-09 – 2021-01-10 (×2): 2 via ORAL
  Filled 2021-01-09 (×4): qty 2

## 2021-01-09 MED ORDER — ZOLEDRONIC ACID 4 MG/5ML IV CONC
4.0000 mg | Freq: Once | INTRAVENOUS | Status: AC
  Administered 2021-01-09: 4 mg via INTRAVENOUS
  Filled 2021-01-09 (×2): qty 5

## 2021-01-09 MED ORDER — IPRATROPIUM-ALBUTEROL 0.5-2.5 (3) MG/3ML IN SOLN
3.0000 mL | Freq: Four times a day (QID) | RESPIRATORY_TRACT | Status: DC
  Administered 2021-01-09 (×2): 3 mL via RESPIRATORY_TRACT
  Filled 2021-01-09 (×2): qty 3

## 2021-01-09 MED ORDER — IPRATROPIUM-ALBUTEROL 0.5-2.5 (3) MG/3ML IN SOLN: 3.0000 mL | RESPIRATORY_TRACT | Status: DC | PRN

## 2021-01-09 MED ORDER — OXYCODONE HCL 5 MG PO TABS
5.0000 mg | ORAL_TABLET | ORAL | Status: DC | PRN
  Administered 2021-01-09 – 2021-01-10 (×2): 5 mg via ORAL
  Filled 2021-01-09 (×2): qty 1

## 2021-01-09 MED ORDER — DM-GUAIFENESIN ER 30-600 MG PO TB12: 1.0000 | ORAL_TABLET | Freq: Two times a day (BID) | ORAL | Status: DC | PRN

## 2021-01-09 MED ORDER — POLYETHYLENE GLYCOL 3350 17 G PO PACK
17.0000 g | PACK | Freq: Two times a day (BID) | ORAL | Status: DC
  Administered 2021-01-09 – 2021-01-10 (×2): 17 g via ORAL
  Filled 2021-01-09 (×4): qty 1

## 2021-01-09 NOTE — Consult Note (Signed)
Consultation Note Date: 01/09/2021   Patient Name: Cody Carr  DOB: 1946/01/01  MRN: [id number]  Age / Sex: 75 y.o., male  PCP: Monico Blitz, MD Referring Physician: Damita Lack, MD  Reason for Consultation: Establishing goals of care  HPI/Patient Profile: 75 y.o. male  with past medical history of throat cancer s/p chemo/radiation, prostate cancer s/p seeds, nephrolithiasis, hypertension, hyperlipidemia admitted on 01/08/2021 with constipation, back pain, poor po intake and concern for underlying lung cancer with concern for multiple sites of metastatic disease.   Clinical Assessment and Goals of Care: I met today at Cody Carr's bedside along with his wife and daughter, Cody Carr. Cody Carr is resting comfortably. We discussed concern for cancer and concern that this cancer is likely very advanced due to the multiple sites that we are seeing on scans concerning for malignancy. We discussed process of not knowing exactly what this could mean but based on progression of scans for lung mass I worry this is very aggressive. They express concern for noted lung mass and that this was not explained to them and they question if he had closer follow up if something could have been done sooner before spreading and we discussed that nothing would have changed over the past few weeks but over the past months there is no way to know for sure.   I spent some time discussing with them my concerns for this cancer vs his previous cancer or tongue and prostate. I worry this is more advanced and potential treatment options could be much more difficult for him especially in his current state of health. We discussed the importance of weighing the risks vs benefits or work up and treatment. They are all clear that they wish to pursue biopsy and treatment options. We discussed plan for biopsy and likely a waiting period for  results before treatment options can be recommended. I encouraged family to continue conversations throughout this process of considering best case scenario and if treatment is going to be truly overall beneficial or potentially make Cody Carr quality of life and functional status worse without reaping the benefits of more good time. I encouraged family to ask the difficult questions while discussing with oncology. We also discussed the barriers to having treatment and that Cody Carr will need to be able to have improved nutrition to even be considered for treatment. Family express understanding. At this time they would like to proceed with biopsy and obtaining more information.   We also discussed advance directives. Cody Carr has copies of Living Will and MOST form that she had pulled up online and they have looked at this but no completed yet. We discussed code status and [name] maintains desire for full code at this time BUT he would not want to be kept alive in vegetative or comatose state. He would not want to be kept alive if he is not expected to make some level of functional recovery. I explained that Living Will is the best place to document these wishes. I anticipate  that as they learn more information about diagnosis and prognosis this will need to be discussed again.   All questions/concerns addressed. Emotional support provided.   Primary Decision Maker PATIENT    SUMMARY OF RECOMMENDATIONS   - Full code for now - Will consult spiritual care for assistance with Living Will/HCPOA - They wish to pursue biopsy and treatment options - Will need ongoing palliative discussions inpatient as well as outpatient setting  Code Status/Advance Care Planning: Full code   Symptom Management:  Per attending.   Palliative Prophylaxis:  Aspiration, Bowel Regimen, Frequent Pain Assessment, Oral Care, and Turn Reposition  Additional Recommendations (Limitations, Scope,  Preferences): Full Scope Treatment  Psycho-social/Spiritual:  Desire for further Chaplaincy support:yes Additional Recommendations: Caregiving  Support/Resources  Prognosis:  Concern for poor prognosis with metastatic disease process undergoing evaluation and declining nutritional status.   Discharge Planning: To Be Determined      Primary Diagnoses: Present on Admission: . Acute respiratory failure with hypoxia ([place]) . 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 CAP (community acquired pneumonia) . Malignant pleural effusion . Constipation . Hypercalcemia . Protein-calorie malnutrition, moderate ([place])   I have reviewed the medical record, interviewed the patient and family, and examined the patient. The following aspects are pertinent.  Past Medical History:  Diagnosis Date  . Dyspnea    with exercise  . History of kidney stones   . Hyperlipemia   . Hypertension   . Prostate cancer ([place])   . Throat cancer ([place]) 2008   treated with Chemo and Radiation   Social History   Socioeconomic History  . Marital status: Married    Spouse name: Not on file  . Number of children: 1  . Years of education: Not on file  . Highest education level: Not on file  Occupational History  . Not on file  Tobacco Use  . Smoking status: Former    Packs/day: 0.50    Years: 12.00    Pack years: 6.00    Types: Cigarettes    Quit date: 1980    Years since quitting: 42.7  . Smokeless tobacco: Never  Vaping Use  . Vaping Use: Never used  Substance and Sexual Activity  . Alcohol use: Not Currently  . Drug use: Never  . Sexual activity: Not Currently    Comment: erectile dysfunction  Other Topics Concern  . Not on file  Social History Narrative  . Not on file   Social Determinants of Health   Financial Resource Strain: Not on file  Food Insecurity: Not on file  Transportation Needs: Not on file  Physical Activity: Not on file  Stress: Not on file  Social Connections: Not on file   Family  History  Problem Relation Age of Onset  . Prostate cancer Neg Hx   . Breast cancer Neg Hx   . Colon cancer Neg Hx   . Pancreatic cancer Neg Hx    Scheduled Meds: . feeding supplement  237 mL Oral BID BM  . heparin  5,000 Units Subcutaneous Q8H  . ipratropium-albuterol  3 mL Nebulization Q6H  . lisinopril  2.5 mg Oral QPM  . polyethylene glycol  17 g Oral BID  . rosuvastatin  5 mg Oral QPM  . senna-docusate  2 tablet Oral BID   Continuous Infusions: . sodium chloride 100 mL/hr at 01/09/21 0913  . azithromycin    . cefTRIAXone (ROCEPHIN)  IV     PRN Meds:.dextromethorphan-guaiFENesin, ipratropium-albuterol, morphine injection, ondansetron (ZOFRAN) IV, oxyCODONE, senna-docusate No Known Allergies Review  of Systems  Constitutional:  Positive for activity change and appetite change.  Respiratory:  Negative for choking and shortness of breath.   Gastrointestinal:  Positive for constipation.  Neurological:  Positive for weakness.   Physical Exam Vitals and nursing note reviewed.  Constitutional:      General: He is not in acute distress.    Appearance: He is ill-appearing.  Cardiovascular:     Rate and Rhythm: Normal rate.  Pulmonary:     Effort: No tachypnea, accessory muscle usage or respiratory distress.  Abdominal:     General: Abdomen is flat. There is no distension.     Palpations: Abdomen is soft.     Tenderness: There is no abdominal tenderness.     Comments: Constipation but abd soft, flat, nontender  Neurological:     Mental Status: He is alert and oriented to person, place, and time.    Vital Signs: BP (!) 143/89   Pulse 78   Temp 97.6 F (36.4 C) (Oral)   Resp 18   SpO2 100%  Pain Scale: 0-10   Pain Score: 0-No pain   SpO2: SpO2: 100 % O2 Device:SpO2: 100 % O2 Flow Rate: .O2 Flow Rate (L/min): 2 L/min  IO: Intake/output summary:  Intake/Output Summary (Last 24 hours) at 01/09/2021 1120 Last data filed at 01/09/2021 0913 Gross per 24 hour  Intake  2043.56 ml  Output --  Net 2043.56 ml    LBM: Last BM Date: 01/05/21 Baseline Weight:   Most recent weight:       Palliative Assessment/Data:     Time In: 1000 Time Out: 1115 Time Total: 75 min Greater than 50%  of this time was spent counseling and coordinating care related to the above assessment and plan.  Signed by: Vinie Sill, NP Palliative Medicine Team Pager # [phone] (M-F 8a-5p) Team Phone # [phone] (Nights/Weekends)

## 2021-01-09 NOTE — Progress Notes (Signed)
PROGRESS NOTE    GORDAN GRELL  PYK:[id number] DOB: 09-15-1945 DOA: 01/08/2021 PCP: Monico Blitz, MD   Brief Narrative:  75 y.o. male, with history of nephrolithiasis, hyperlipidemia, hypertension, prostate cancer with seeds, throat cancer status post chemoradiation per patient, and more presents the ED with a chief complaint of back pain, constipation, and poor p.o. intake.  CT showed right-sided pleural effusion, large necrotic appearing mass in the posterior medial right lobe to transfer his right diaphragm extending into adjacent liver.  Lytic lesions in the lumbar spine concerning for metastatic disease enhancing soft tissues mass in the left gluteal muscle, bilateral renal lesions.   Assessment & Plan:   Active Problems:   Acute respiratory failure with hypoxia ([facility])   Metastatic cancer ([facility])   CAP (community acquired pneumonia)   Malignant pleural effusion   Constipation   Hypercalcemia   Protein-calorie malnutrition, moderate ([facility])  Acute respiratory failure with hypoxia No evidence of pulmonary embolism.  Suspect this is postobstructive pneumonia/atelectasis.  Bronchodilators, I-S/flutter valve.  Check procalcitonin.  Newly discovered metastatic disease Appears to have multiple metastatic areas.  Seen by palliative care service.  Discussed case with oncology who recommends lung biopsy.  IR consulted who recommend transferring patient to [facility] for the procedure.  Hypercalcemia Suspect from lytic bone lesions.  Closely monitor.  Pleural effusion Will await for oncology input.  Further diagnostic work-up?.  Palliative care consulted.  Community-acquired pneumonia Check procalcitonin.  On empiric IV Rocephin and azithromycin  Constipation, likely narcotic induced Aggressive bowel regimen.    Protein calorie malnutrition, moderate Encourage oral intake.  Supplements.  Hyperlipidemia Statin  Hypertension Continue lisinopril  Severe  constipation-likely opioid induced.  Aggressive bowel regimen, enemas ordered.  Overall patient appears to have very poor prognosis given extensive metastatic disease.  Palliative care and oncology team consulted.  Patient will be transferred to [facility] for IR consultation with CT-guided lung biopsy.  Patient signed out to Dr. Irene Pap   DVT prophylaxis: heparin injection 5,000 Units Start: 01/08/21 2315 SCDs Start: 01/08/21 2224  Code Status: Full Code Family Communication:  Daughter and wife at bedside    Dispo: The patient is from: Home              Anticipated d/c is to: Home              Patient currently is not medically stable to d/c.   Difficult to place patient No      There is no height or weight on file to calculate BMI.  Pressure Injury 01/08/21 Sacrum Medial Stage 2 -  Partial thickness loss of dermis presenting as a shallow open injury with a red, pink wound bed without slough. 4cm x 2 cm stage 2 to sacrum (Active)  01/08/21 2234  Location: Sacrum  Location Orientation: Medial  Staging: Stage 2 -  Partial thickness loss of dermis presenting as a shallow open injury with a red, pink wound bed without slough.  Wound Description (Comments): 4cm x 2 cm stage 2 to sacrum  Present on Admission: Yes     Subjective: Overall feels weak has some back pain.  Patient and family tells me he has been suffering from off-and-on back pain and weakness for the past 3 months.  Review of Systems Otherwise negative except as per HPI, including: General: Denies fever, chills, night sweats or unintended weight loss. Resp: Denies cough, wheezing, shortness of breath. Cardiac: Denies chest pain, palpitations, orthopnea, paroxysmal nocturnal dyspnea. GI: Denies abdominal  pain, nausea, vomiting, diarrhea or constipation GU: Denies dysuria, frequency, hesitancy or incontinence MS: Denies muscle aches, joint pain or swelling Neuro: Denies headache, neurologic deficits  (focal weakness, numbness, tingling), abnormal gait Psych: Denies anxiety, depression, SI/HI/AVH Skin: Denies new rashes or lesions ID: Denies sick contacts, exotic exposures, travel  Examination:  General exam: Appears calm and comfortable, chronically ill-appearing with bilateral temporal wasting.  Frail. Respiratory system: Clear to auscultation. Respiratory effort normal. Cardiovascular system: S1 & S2 heard, RRR. No JVD, murmurs, rubs, gallops or clicks. No pedal edema. Gastrointestinal system: Abdomen is nondistended, soft and nontender. No organomegaly or masses felt. Normal bowel sounds heard. Central nervous system: Alert and oriented. No focal neurological deficits. Extremities: Symmetric 5 x 5 power. Skin: No rashes, lesions or ulcers Psychiatry: Judgement and insight appear normal. Mood & affect appropriate.     Objective: Vitals:   01/08/21 2135 01/08/21 2226 01/09/21 0314 01/09/21 0537  BP: 127/78 132/80 (!) 164/86 (!) 153/85  Pulse: 77 78 82 74  Resp: 14 18 18 18   Temp:  98.1 F (36.7 C) 97.9 F (36.6 C) 97.6 F (36.4 C)  TempSrc:    Oral  SpO2: 100% 100% 97% 100%    Intake/Output Summary (Last 24 hours) at 01/09/2021 0830 Last data filed at 01/09/2021 7858 Gross per 24 hour  Intake 1865.23 ml  Output --  Net 1865.23 ml   There were no vitals filed for this visit.   Data Reviewed:   CBC: Recent Labs  Lab 01/08/21 1532 01/09/21 0522  WBC 22.9* 21.3*  NEUTROABS 20.6* 19.0*  HGB 11.8* 10.7*  HCT 38.0* 35.6*  MCV 87.2 88.3  PLT 563* 850*   Basic Metabolic Panel: Recent Labs  Lab 01/08/21 1532 01/09/21 0522  NA 136 143  K 3.7 3.6  CL 101 107  CO2 28 28  GLUCOSE 93 85  BUN 28* 24*  CREATININE 0.73 0.68  CALCIUM 10.7* 10.6*   GFR: CrCl cannot be calculated (Unknown ideal weight.). Liver Function Tests: Recent Labs  Lab 01/08/21 1532 01/09/21 0522  AST 19 15  ALT 22 17  ALKPHOS 175* 157*  BILITOT 1.3* 1.0  PROT 5.7* 5.3*  ALBUMIN  2.2* 2.0*   Recent Labs  Lab 01/08/21 1532  LIPASE 24   No results for input(s): AMMONIA in the last 168 hours. Coagulation Profile: No results for input(s): INR, PROTIME in the last 168 hours. Cardiac Enzymes: No results for input(s): CKTOTAL, CKMB, CKMBINDEX, TROPONINI in the last 168 hours. BNP (last 3 results) No results for input(s): PROBNP in the last 8760 hours. HbA1C: No results for input(s): HGBA1C in the last 72 hours. CBG: No results for input(s): GLUCAP in the last 168 hours. Lipid Profile: No results for input(s): CHOL, HDL, LDLCALC, TRIG, CHOLHDL, LDLDIRECT in the last 72 hours. Thyroid Function Tests: No results for input(s): TSH, T4TOTAL, FREET4, T3FREE, THYROIDAB in the last 72 hours. Anemia Panel: No results for input(s): VITAMINB12, FOLATE, FERRITIN, TIBC, IRON, RETICCTPCT in the last 72 hours. Sepsis Labs: No results for input(s): PROCALCITON, LATICACIDVEN in the last 168 hours.  No results found for this or any previous visit (from the past 240 hour(s)).       Radiology Studies: CT Angio Chest PE W and/or Wo Contrast  Result Date: 01/08/2021 CLINICAL DATA:  Chest pain.  High probability for PE. EXAM: CT ANGIOGRAPHY CHEST WITH CONTRAST TECHNIQUE: Multidetector CT imaging of the chest was performed using the standard protocol during bolus administration of intravenous contrast. Multiplanar CT  image reconstructions and MIPs were obtained to evaluate the vascular anatomy. CONTRAST:  114mL OMNIPAQUE IOHEXOL 350 MG/ML SOLN COMPARISON:  Prior CT scan of the chest 04/05/2020 FINDINGS: Cardiovascular: Satisfactory opacification of the pulmonary arteries to the segmental level. No evidence of pulmonary embolism. Normal heart size. No pericardial effusion. Fusiform aneurysmal dilation of the ascending thoracic aorta with a maximal transverse diameter of 4.5 cm. Scattered atherosclerotic calcifications throughout the aorta and the native coronary arteries.  Mediastinum/Nodes: Unremarkable thyroid gland. High right paratracheal lymph node measures 1.4 cm in short axis (image 22 series 4). Left paratracheal lymph node measures 1.5 cm in short axis (image 34 series 4). Subcarinal lymphadenopathy measures approximately 2.2 cm in short axis (image 42 series 4). Posterior left mediastinal mass measures a proximally 4.5 x 2.8 x 3.8 cm indirectly invades the head and neck of the left third rib. The mass also extends into the neural foramen. Lungs/Pleura: Approximally 9.0 x 8.0 x 8.7 cm ill-defined heterogeneous mass centered in the posteromedial aspect of the right lower lobe contains numerous punctate internal calcifications. The mass also appears to extend across the diaphragm and directly into segment 7 of the liver. There is a small and likely malignant associated right pleural effusion. 0.6 cm right middle lobe pulmonary nodule (image 73 series 6) is indeterminate. 2 mm nodule more superiorly on image 67 also noted. Peripheral subpleural reticulation, architectural distortion and honeycombing in the left lung base suggests underlying pulmonary fibrosis. Upper Abdomen: No acute abnormality. Musculoskeletal: Soft tissue lytic lesion destroying the head and neck of the left third rib with extension into the neural foramen as described above. No additional foci of osseous metastatic disease identified. Review of the MIP images confirms the above findings. IMPRESSION: 1. Approximally 9 cm ill-defined heterogeneous right lower lobe mass appears to cross the diaphragm and directly invade segment 7 of the liver. Findings are highly concerning for advanced primary bronchogenic carcinoma. 2. Lytic soft tissue metastasis in the left posterior mediastinum centered on the head and neck of the left third rib. The mass encroaches into the left T3-T4 neural foramen. 3. Presumably metastatic mediastinal lymphadenopathy. 4. Small right-sided pleural effusion is likely malignant. 5.  Nonspecific right middle lobe pulmonary nodules measuring 0.6 and 0.2 cm. Small pulmonary metastases versus incidental benign nodules. 6. 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 7. 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 Ascending thoracic aortic aneurysm. Recommend semi-annual imaging followup by CTA or MRA and referral to cardiothoracic surgery if not already obtained. This recommendation follows 2010 ACCF/AHA/AATS/ACR/ASA/SCA/SCAI/SIR/STS/SVM Guidelines for the Diagnosis and Management of Patients With Thoracic Aortic Disease. Circulation. 2010; 121: Y637-C588. Aortic aneurysm NOS (ICD10-I71.9); Aortic Atherosclerosis (ICD10-I70.0). 8. Coronary artery calcifications. Electronically Signed   By: Jacqulynn Cadet M.D.   On: 01/08/2021 17:09   CT ABDOMEN PELVIS W CONTRAST  Result Date: 01/08/2021 CLINICAL DATA:  Left lower quadrant pain EXAM: CT ABDOMEN AND PELVIS WITH CONTRAST TECHNIQUE: Multidetector CT imaging of the abdomen and pelvis was performed using the standard protocol following bolus administration of intravenous contrast. CONTRAST:  173mL OMNIPAQUE IOHEXOL 350 MG/ML SOLN COMPARISON:  CT 01/19/2020, chest CT 01/08/2021, FINDINGS: Lower chest: Mild aneurysmal dilatation of the ascending aorta measuring up to 4.4 cm, heavily calcified. Extensive coronary vascular calcification. Normal cardiac size. Trace pericardial effusion. Incompletely visualized necrotic appearing subcarinal lymph node measuring up to 2.6 cm. Large necrotic appearing mass in the medial right base with punctate calcifications, this measures approximately 9.6 by 7.8 cm and  extends across the right diaphragm, into the adjacent liver. Thick rimmed hypodense enhancing lesion in the right hepatic lobe measuring 3.8 cm. Small right-sided pleural effusion Hepatobiliary: No calcified gallstone. No biliary dilatation. Heterogenous enhancement of right hepatic lobe adjacent to mass  lesion. Pancreas: Unremarkable. No pancreatic ductal dilatation or surrounding inflammatory changes. Spleen: Indeterminate hypoenhancing mass within the spleen measuring 2.4 cm, series 2, image 34. Adrenals/Urinary Tract: Adrenal glands are normal. Punctate nonobstructing stones within the bilateral kidneys. Scattered renal cysts. Interim finding of 14 mm indeterminate right renal lesion at the midpole, series 2, image 52. Interim finding of indeterminate 13 mm exophytic lesion mid pole left kidney, series 2, image 44. Slightly thick-walled urinary bladder. 4 mm stone within the right posterior bladder. Stomach/Bowel: The stomach is nonenlarged. No dilated small bowel. No acute bowel wall thickening. Moderate to large stool in the rectosigmoid colon with moderate formed feces at the rectum. Negative appendix Vascular/Lymphatic: Advanced aortic atherosclerosis. No aneurysm. No suspicious nodes. Reproductive: Post treatment changes of the prostate gland Other: Presacral edema and soft tissue stranding.  No free air. Musculoskeletal: Lytic lesion and soft tissue mass involving the L1 vertebra right pedicle, lamina, vertebral body and transverse process. Soft tissue mass involves the paraspinal soft tissues as well. 2.5 cm hypodense lesion in the right paraspinal soft tissues posterior to right transverse process at L2, series 2, image 49. Large lytic lesion L5 vertebral body. Chronic bilateral pars defect at L5. 5.5 x 4.3 cm enhancing heterogenous soft tissue mass in the left gluteus muscles, series 2, image 93. IMPRESSION: 1. Small right-sided pleural effusion with large necrotic appearing mass in the posteromedial right lower lobe that appears to traverse the right diaphragm and extends into the adjacent liver. 2. Lytic lesion with associated soft tissue mass at L1 with additional lytic lesion at L5 vertebral body consistent with metastatic disease. 5.5 cm heterogenous enhancing soft tissue mass in the left gluteus  muscles also suspect for metastatic disease. Small hypodense right paraspinal soft tissue mass posterior to transverse process of L2, concerning for metastatic disease 3. Vague hypodensity within the mid spleen is indeterminate for infarct or hypodense mass lesion. 4. Interval development of bilateral indeterminate renal lesions measuring up to 13 mm. 5. Nonobstructing kidney stones.  4 mm stone in the bladder Electronically Signed   By: Donavan Foil M.D.   On: 01/08/2021 17:39   CT L-SPINE NO CHARGE  Result Date: 01/08/2021 CLINICAL DATA:  Back pain. EXAM: CT LUMBAR SPINE WITH CONTRAST TECHNIQUE: Technique: Multiplanar CT images of the lumbar spine were reconstructed from contemporary CT of the Abdomen and Pelvis. CONTRAST:  No additional COMPARISON:  CT abdomen and pelvis 01/19/2020 FINDINGS: Segmentation: 5 lumbar type vertebrae. Alignment: Normal. Vertebrae: Chronic bilateral L5 pars defects. 5 cm destructive mass involving the right-sided posterior elements and posterior vertebral body of L1 mildly encroaching upon the right L1-2 neural foramen. 3.7 cm lytic lesion in the left aspect of the L5 vertebral body with focal disruption of the posterior vertebral body cortex and likely small volume epidural tumor in the left lateral recess. Preserved vertebral body heights. Paraspinal and other soft tissues: 2.5 cm hypodense mass in the paraspinal soft tissues posterior to the right L2 transverse process. Intra-abdominal and pelvic contents reported separately. Disc levels: Mild lumbar spondylosis and facet arthrosis without high-grade spinal stenosis. Mild-to-moderate multilevel neural foraminal stenosis. IMPRESSION: 1. Destructive bone lesions at L1 and L5 consistent with metastatic disease. Suspected small volume epidural tumor in the left lateral recess at  L5. 2. 2.5 cm paraspinal soft tissue mass posterior to the right L2 transverse process also concerning for metastatic disease. Electronically Signed   By:  Logan Bores M.D.   On: 01/08/2021 18:16        Scheduled Meds:  feeding supplement  237 mL Oral BID BM   heparin  5,000 Units Subcutaneous Q8H   lisinopril  2.5 mg Oral QPM   rosuvastatin  5 mg Oral QPM   Continuous Infusions:  sodium chloride 100 mL/hr at 01/09/21 0726   azithromycin     cefTRIAXone (ROCEPHIN)  IV       LOS: 0 days   Time spent= 35 mins    Revin Corker Arsenio Loader, MD [facility]  If 7PM-7AM, please contact night-coverage  01/09/2021, 8:30 AM

## 2021-01-09 NOTE — Consult Note (Signed)
[facility] Consultation Oncology  Name: Cody Carr      MRN: [id number]    Location: A306/A306-01  Date: 01/09/2021 Time:5:15 PM   REFERRING PHYSICIAN: Dr. 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 Reesa Chew for further work-up and management of what appears to be metastatic lung cancer.  He came in with worsening weakness in the last 3 weeks.  He was found to have an elevated calcium of 10.7.  CT imaging showed 9 cm right lower lobe mass directly invading segment 7 of the liver.  Lytic soft tissue metastasis in the left posterior mediastinum.  Mass encroaches into the left T3-T4 neural foramina.  Metastatic mediastinal lymphadenopathy.  CT scan of the abdomen and pelvis showed lytic lesion at L1 and L5 vertebral bodies and a 5.5 cm enhancing soft tissue mass in the left gluteal muscle.  He has a history of smoking and quit 40 years ago.  He worked in [employer] and has exposure to some dyes and cotton fibers.  He also worked in [employer].  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  He was also diagnosed with stage T1c Gleason 6 prostate cancer and underwent radioactive seed implantation in March 2022.  No major family history of malignancies.  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     PAST SURGICAL HISTORY Past Surgical History:  Procedure Laterality Date   dental implants  2008   EXTRACORPOREAL SHOCK WAVE  LITHOTRIPSY Right 03/06/2020   Procedure: EXTRACORPOREAL SHOCK WAVE LITHOTRIPSY (ESWL);  Surgeon: Cleon Gustin, MD;  Location: [facility];  Service: Urology;  Laterality: Right;   EYE SURGERY Bilateral 2021   ioc lens for cataracts   RADIOACTIVE SEED IMPLANT N/A 07/27/2020   Procedure: RADIOACTIVE SEED IMPLANT/BRACHYTHERAPY IMPLANT;  Surgeon: Irine Seal, MD;  Location: [facility];  Service: Urology;  Laterality: N/A;  63 SEEDS   SPACE OAR INSTILLATION N/A 07/27/2020   Procedure: SPACE OAR INSTILLATION;  Surgeon: Irine Seal, MD;  Location: [facility];  Service: Urology;  Laterality: N/A;   throat biopsy  2008    FAMILY HISTORY: Family History  Problem Relation Age of Onset   Prostate cancer Neg Hx    Breast cancer Neg Hx    Colon cancer Neg Hx    Pancreatic cancer Neg Hx     SOCIAL HISTORY:  reports that he quit smoking about 42 years ago. His smoking use included cigarettes. He has a 6.00 pack-year smoking history. He has never used smokeless tobacco. He reports that he does not currently use alcohol. He reports that he does not use drugs.  PERFORMANCE STATUS: The patient's performance status is 2 - Symptomatic, <50% confined to bed  PHYSICAL EXAM: Most Recent Vital Signs: Blood pressure (!) 153/84, pulse 76, temperature 98.2 F (36.8 C), temperature source Oral, resp. rate 20,  SpO2 98 %. BP (!) 153/84 (BP Location: Left Arm)   Pulse 76   Temp 98.2 F (36.8 C) (Oral)   Resp 20   SpO2 98%  General appearance: alert, cooperative, and appears stated age Lungs:  Bilateral air entry with decreased breath sounds in the bases. Heart: regular rate and rhythm Abdomen:  Soft, nontender with no palpable masses. Extremities:  No edema or cyanosis. Neurologic: Grossly normal  LABORATORY DATA:  Results for orders placed or performed during the hospital encounter of 01/08/21 (from the past 48 hour(s))  CBC with Differential     Status: Abnormal    Collection Time: 01/08/21  3:32 PM  Result Value Ref Range   WBC 22.9 (H) 4.0 - 10.5 K/uL   RBC 4.36 4.22 - 5.81 MIL/uL   Hemoglobin 11.8 (L) 13.0 - 17.0 g/dL   HCT 38.0 (L) 39.0 - 52.0 %   MCV 87.2 80.0 - 100.0 fL   MCH 27.1 26.0 - 34.0 pg   MCHC 31.1 30.0 - 36.0 g/dL   RDW 15.9 (H) 11.5 - 15.5 %   Platelets 563 (H) 150 - 400 K/uL   nRBC 0.0 0.0 - 0.2 %   Neutrophils Relative % 90 %   Neutro Abs 20.6 (H) 1.7 - 7.7 K/uL   Lymphocytes Relative 3 %   Lymphs Abs 0.8 0.7 - 4.0 K/uL   Monocytes Relative 5 %   Monocytes Absolute 1.1 (H) 0.1 - 1.0 K/uL   Eosinophils Relative 1 %   Eosinophils Absolute 0.2 0.0 - 0.5 K/uL   Basophils Relative 0 %   Basophils Absolute 0.1 0.0 - 0.1 K/uL   Immature Granulocytes 1 %   Abs Immature Granulocytes 0.18 (H) 0.00 - 0.07 K/uL    Comment: Performed at [facility], [address] [place], [place] [address]  Comprehensive metabolic panel     Status: Abnormal   Collection Time: 01/08/21  3:32 PM  Result Value Ref Range   Sodium 136 135 - 145 mmol/L   Potassium 3.7 3.5 - 5.1 mmol/L   Chloride 101 98 - 111 mmol/L   CO2 28 22 - 32 mmol/L   Glucose, Bld 93 70 - 99 mg/dL    Comment: Glucose reference range applies only to samples taken after fasting for at least 8 hours.   BUN 28 (H) 8 - 23 mg/dL   Creatinine, Ser 0.73 0.61 - 1.24 mg/dL   Calcium 10.7 (H) 8.9 - 10.3 mg/dL   Total Protein 5.7 (L) 6.5 - 8.1 g/dL   Albumin 2.2 (L) 3.5 - 5.0 g/dL   AST 19 15 - 41 U/L   ALT 22 0 - 44 U/L   Alkaline Phosphatase 175 (H) 38 - 126 U/L   Total Bilirubin 1.3 (H) 0.3 - 1.2 mg/dL   GFR, Estimated >60 >60 mL/min    Comment: (NOTE) Calculated using the CKD-EPI Creatinine Equation (2021)    Anion gap 7 5 - 15    Comment: Performed at [facility], [address] [place], [place] [address]  Lipase, blood     Status: None   Collection Time: 01/08/21  3:32 PM  Result Value Ref Range   Lipase 24 11 - 51 U/L    Comment: Performed at [facility], [address], [place], [place] [address]  Urinalysis, Routine w reflex microscopic Urine, Clean Catch     Status: None   Collection Time: 01/08/21  4:15 PM  Result Value Ref Range   Color, Urine YELLOW  YELLOW   APPearance CLEAR CLEAR   Specific Gravity, Urine 1.025 1.005 - 1.030   pH 6.0 5.0 - 8.0   Glucose, UA NEGATIVE NEGATIVE mg/dL   Hgb urine dipstick NEGATIVE NEGATIVE   Bilirubin Urine NEGATIVE NEGATIVE   Ketones, ur NEGATIVE NEGATIVE mg/dL   Protein, ur NEGATIVE NEGATIVE mg/dL   Nitrite NEGATIVE NEGATIVE   Leukocytes,Ua NEGATIVE NEGATIVE    Comment: Microscopic not done on urines with negative protein, blood, leukocytes, nitrite, or glucose < 500 mg/dL. Performed at [facility], [address] [place], [place] [address]   SARS CORONAVIRUS 2 (TAT 6-24 HRS) Nasopharyngeal Nasopharyngeal Swab     Status: None   Collection Time: 01/08/21  9:27 PM   Specimen: Nasopharyngeal Swab  Result Value Ref Range   SARS Coronavirus 2 NEGATIVE NEGATIVE    Comment: (NOTE) SARS-CoV-2 target nucleic acids are NOT DETECTED.  The SARS-CoV-2 RNA is generally detectable in upper and lower respiratory specimens during the acute phase of infection. Negative results do not preclude SARS-CoV-2 infection, do not rule out co-infections with other pathogens, and should not be used as the sole basis for treatment or other patient management decisions. Negative results must be combined with clinical observations, patient history, and epidemiological information. The expected result is Negative.  Fact Sheet for Patients: SugarRoll.be  Fact Sheet for Healthcare Providers: https://www.woods-mathews.com/  This test is not yet approved or cleared by the [place] FDA and  has been authorized for detection and/or diagnosis of SARS-CoV-2 by FDA under an Emergency Use Authorization (EUA). This EUA will remain  in effect (meaning this test can be used) for the duration of  the COVID-19 declaration under Se ction 564(b)(1) of the Act, 21 U.S.C. section 360bbb-3(b)(1), unless the authorization is terminated or revoked sooner.  Performed at [facility], [place] [address] [place], [place] [address]   HIV Antibody (routine testing w rflx)     Status: None   Collection Time: 01/09/21  5:22 AM  Result Value Ref Range   HIV Screen 4th Generation wRfx Non Reactive Non Reactive    Comment: Performed at [facility], [place] [address] [place], [place] [address]  CBC WITH DIFFERENTIAL     Status: Abnormal   Collection Time: 01/09/21  5:22 AM  Result Value Ref Range   WBC 21.3 (H) 4.0 - 10.5 K/uL   RBC 4.03 (L) 4.22 - 5.81 MIL/uL   Hemoglobin 10.7 (L) 13.0 - 17.0 g/dL   HCT 35.6 (L) 39.0 - 52.0 %   MCV 88.3 80.0 - 100.0 fL   MCH 26.6 26.0 - 34.0 pg   MCHC 30.1 30.0 - 36.0 g/dL   RDW 15.8 (H) 11.5 - 15.5 %   Platelets 535 (H) 150 - 400 K/uL   nRBC 0.0 0.0 - 0.2 %   Neutrophils Relative % 90 %   Neutro Abs 19.0 (H) 1.7 - 7.7 K/uL   Lymphocytes Relative 3 %   Lymphs Abs 0.7 0.7 - 4.0 K/uL   Monocytes Relative 5 %   Monocytes Absolute 1.1 (H) 0.1 - 1.0 K/uL   Eosinophils Relative 1 %   Eosinophils Absolute 0.2 0.0 - 0.5 K/uL   Basophils Relative 0 %   Basophils Absolute 0.1 0.0 - 0.1 K/uL   Immature Granulocytes 1 %   Abs Immature Granulocytes 0.22 (H) 0.00 - 0.07 K/uL    Comment: Performed at [facility], [address] [place], [place] [address]  Comprehensive metabolic panel  Status: Abnormal   Collection Time: 01/09/21  5:22 AM  Result Value Ref Range   Sodium 143 135 - 145 mmol/L    Comment: DELTA CHECK NOTED   Potassium 3.6 3.5 - 5.1 mmol/L   Chloride 107 98 - 111 mmol/L   CO2 28 22 - 32 mmol/L   Glucose, Bld 85 70 - 99 mg/dL    Comment: Glucose reference range applies only to samples taken after fasting for at least 8 hours.   BUN 24 (H) 8 - 23 mg/dL   Creatinine, Ser 0.68 0.61 - 1.24 mg/dL   Calcium 10.6 (H) 8.9 - 10.3 mg/dL    Total Protein 5.3 (L) 6.5 - 8.1 g/dL   Albumin 2.0 (L) 3.5 - 5.0 g/dL   AST 15 15 - 41 U/L   ALT 17 0 - 44 U/L   Alkaline Phosphatase 157 (H) 38 - 126 U/L   Total Bilirubin 1.0 0.3 - 1.2 mg/dL   GFR, Estimated >60 >60 mL/min    Comment: (NOTE) Calculated using the CKD-EPI Creatinine Equation (2021)    Anion gap 8 5 - 15    Comment: Performed at [facility], [place] [address] [place], [place] [address]  Procalcitonin - Baseline     Status: None   Collection Time: 01/09/21  8:57 AM  Result Value Ref Range   Procalcitonin 0.30 ng/mL    Comment:        Interpretation: PCT (Procalcitonin) <= 0.5 ng/mL: Systemic infection (sepsis) is not likely. Local bacterial infection is possible. (NOTE)       Sepsis PCT Algorithm           Lower Respiratory Tract                                      Infection PCT Algorithm    ----------------------------     ----------------------------         PCT < 0.25 ng/mL                PCT < 0.10 ng/mL          Strongly encourage             Strongly discourage   discontinuation of antibiotics    initiation of antibiotics    ----------------------------     -----------------------------       PCT 0.25 - 0.50 ng/mL            PCT 0.10 - 0.25 ng/mL               OR       >80% decrease in PCT            Discourage initiation of                                            antibiotics      Encourage discontinuation           of antibiotics    ----------------------------     -----------------------------         PCT >= 0.50 ng/mL              PCT 0.26 - 0.50 ng/mL               AND        <  80% decrease in PCT             Encourage initiation of                                             antibiotics       Encourage continuation           of antibiotics    ----------------------------     -----------------------------        PCT >= 0.50 ng/mL                  PCT > 0.50 ng/mL               AND         increase in PCT                   Strongly encourage                                      initiation of antibiotics    Strongly encourage escalation           of antibiotics                                     -----------------------------                                           PCT <= 0.25 ng/mL                                                 OR                                        > 80% decrease in PCT                                      Discontinue / Do not initiate                                             antibiotics  Performed at [facility], [address] [place], [place] [address]   TSH     Status: None   Collection Time: 01/09/21  8:57 AM  Result Value Ref Range   TSH 1.854 0.350 - 4.500 uIU/mL    Comment: Performed by a 3rd Generation assay with a functional sensitivity of <=0.01 uIU/mL. Performed at [facility], [address] [place], [place] [address]       RADIOGRAPHY: CT Angio Chest PE W and/or Wo Contrast  Result Date: 01/08/2021 CLINICAL DATA:  Chest pain.  High probability for PE. EXAM: CT ANGIOGRAPHY CHEST WITH CONTRAST TECHNIQUE: Multidetector CT  imaging of the chest was performed using the standard protocol during bolus administration of intravenous contrast. Multiplanar CT image reconstructions and MIPs were obtained to evaluate the vascular anatomy. CONTRAST:  17mL OMNIPAQUE IOHEXOL 350 MG/ML SOLN COMPARISON:  Prior CT scan of the chest 04/05/2020 FINDINGS: Cardiovascular: Satisfactory opacification of the pulmonary arteries to the segmental level. No evidence of pulmonary embolism. Normal heart size. No pericardial effusion. Fusiform aneurysmal dilation of the ascending thoracic aorta with a maximal transverse diameter of 4.5 cm. Scattered atherosclerotic calcifications throughout the aorta and the native coronary arteries. Mediastinum/Nodes: Unremarkable thyroid gland. High right paratracheal lymph node measures 1.4 cm in short axis (image 22 series 4). Left paratracheal lymph node  measures 1.5 cm in short axis (image 34 series 4). Subcarinal lymphadenopathy measures approximately 2.2 cm in short axis (image 42 series 4). Posterior left mediastinal mass measures a proximally 4.5 x 2.8 x 3.8 cm indirectly invades the head and neck of the left third rib. The mass also extends into the neural foramen. Lungs/Pleura: Approximally 9.0 x 8.0 x 8.7 cm ill-defined heterogeneous mass centered in the posteromedial aspect of the right lower lobe contains numerous punctate internal calcifications. The mass also appears to extend across the diaphragm and directly into segment 7 of the liver. There is a small and likely malignant associated right pleural effusion. 0.6 cm right middle lobe pulmonary nodule (image 73 series 6) is indeterminate. 2 mm nodule more superiorly on image 67 also noted. Peripheral subpleural reticulation, architectural distortion and honeycombing in the left lung base suggests underlying pulmonary fibrosis. Upper Abdomen: No acute abnormality. Musculoskeletal: Soft tissue lytic lesion destroying the head and neck of the left third rib with extension into the neural foramen as described above. No additional foci of osseous metastatic disease identified. Review of the MIP images confirms the above findings. IMPRESSION: 1. Approximally 9 cm ill-defined heterogeneous right lower lobe mass appears to cross the diaphragm and directly invade segment 7 of the liver. Findings are highly concerning for advanced primary bronchogenic carcinoma. 2. Lytic soft tissue metastasis in the left posterior mediastinum centered on the head and neck of the left third rib. The mass encroaches into the left T3-T4 neural foramen. 3. Presumably metastatic mediastinal lymphadenopathy. 4. Small right-sided pleural effusion is likely malignant. 5. Nonspecific right middle lobe pulmonary nodules measuring 0.6 and 0.2 cm. Small pulmonary metastases versus incidental benign nodules. 6. 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 7. 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 Ascending thoracic aortic aneurysm. Recommend semi-annual imaging followup by CTA or MRA and referral to cardiothoracic surgery if not already obtained. This recommendation follows 2010 ACCF/AHA/AATS/ACR/ASA/SCA/SCAI/SIR/STS/SVM Guidelines for the Diagnosis and Management of Patients With Thoracic Aortic Disease. Circulation. 2010; 121: N027-O536. Aortic aneurysm NOS (ICD10-I71.9); Aortic Atherosclerosis (ICD10-I70.0). 8. Coronary artery calcifications. Electronically Signed   By: Jacqulynn Cadet M.D.   On: 01/08/2021 17:09   CT ABDOMEN PELVIS W CONTRAST  Result Date: 01/08/2021 CLINICAL DATA:  Left lower quadrant pain EXAM: CT ABDOMEN AND PELVIS WITH CONTRAST TECHNIQUE: Multidetector CT imaging of the abdomen and pelvis was performed using the standard protocol following bolus administration of intravenous contrast. CONTRAST:  167mL OMNIPAQUE IOHEXOL 350 MG/ML SOLN COMPARISON:  CT 01/19/2020, chest CT 01/08/2021, FINDINGS: Lower chest: Mild aneurysmal dilatation of the ascending aorta measuring up to 4.4 cm, heavily calcified. Extensive coronary vascular calcification. Normal cardiac size. Trace pericardial effusion. Incompletely visualized necrotic appearing subcarinal lymph node measuring up to 2.6 cm. Large necrotic  appearing mass in the medial right base with punctate calcifications, this measures approximately 9.6 by 7.8 cm and extends across the right diaphragm, into the adjacent liver. Thick rimmed hypodense enhancing lesion in the right hepatic lobe measuring 3.8 cm. Small right-sided pleural effusion Hepatobiliary: No calcified gallstone. No biliary dilatation. Heterogenous enhancement of right hepatic lobe adjacent to mass lesion. Pancreas: Unremarkable. No pancreatic ductal dilatation or surrounding inflammatory changes. Spleen: Indeterminate hypoenhancing mass within the spleen measuring  2.4 cm, series 2, image 34. Adrenals/Urinary Tract: Adrenal glands are normal. Punctate nonobstructing stones within the bilateral kidneys. Scattered renal cysts. Interim finding of 14 mm indeterminate right renal lesion at the midpole, series 2, image 52. Interim finding of indeterminate 13 mm exophytic lesion mid pole left kidney, series 2, image 44. Slightly thick-walled urinary bladder. 4 mm stone within the right posterior bladder. Stomach/Bowel: The stomach is nonenlarged. No dilated small bowel. No acute bowel wall thickening. Moderate to large stool in the rectosigmoid colon with moderate formed feces at the rectum. Negative appendix Vascular/Lymphatic: Advanced aortic atherosclerosis. No aneurysm. No suspicious nodes. Reproductive: Post treatment changes of the prostate gland Other: Presacral edema and soft tissue stranding.  No free air. Musculoskeletal: Lytic lesion and soft tissue mass involving the L1 vertebra right pedicle, lamina, vertebral body and transverse process. Soft tissue mass involves the paraspinal soft tissues as well. 2.5 cm hypodense lesion in the right paraspinal soft tissues posterior to right transverse process at L2, series 2, image 49. Large lytic lesion L5 vertebral body. Chronic bilateral pars defect at L5. 5.5 x 4.3 cm enhancing heterogenous soft tissue mass in the left gluteus muscles, series 2, image 93. IMPRESSION: 1. Small right-sided pleural effusion with large necrotic appearing mass in the posteromedial right lower lobe that appears to traverse the right diaphragm and extends into the adjacent liver. 2. Lytic lesion with associated soft tissue mass at L1 with additional lytic lesion at L5 vertebral body consistent with metastatic disease. 5.5 cm heterogenous enhancing soft tissue mass in the left gluteus muscles also suspect for metastatic disease. Small hypodense right paraspinal soft tissue mass posterior to transverse process of L2, concerning for metastatic disease 3.  Vague hypodensity within the mid spleen is indeterminate for infarct or hypodense mass lesion. 4. Interval development of bilateral indeterminate renal lesions measuring up to 13 mm. 5. Nonobstructing kidney stones.  4 mm stone in the bladder Electronically Signed   By: Donavan Foil M.D.   On: 01/08/2021 17:39   CT L-SPINE NO CHARGE  Result Date: 01/08/2021 CLINICAL DATA:  Back pain. EXAM: CT LUMBAR SPINE WITH CONTRAST TECHNIQUE: Technique: Multiplanar CT images of the lumbar spine were reconstructed from contemporary CT of the Abdomen and Pelvis. CONTRAST:  No additional COMPARISON:  CT abdomen and pelvis 01/19/2020 FINDINGS: Segmentation: 5 lumbar type vertebrae. Alignment: Normal. Vertebrae: Chronic bilateral L5 pars defects. 5 cm destructive mass involving the right-sided posterior elements and posterior vertebral body of L1 mildly encroaching upon the right L1-2 neural foramen. 3.7 cm lytic lesion in the left aspect of the L5 vertebral body with focal disruption of the posterior vertebral body cortex and likely small volume epidural tumor in the left lateral recess. Preserved vertebral body heights. Paraspinal and other soft tissues: 2.5 cm hypodense mass in the paraspinal soft tissues posterior to the right L2 transverse process. Intra-abdominal and pelvic contents reported separately. Disc levels: Mild lumbar spondylosis and facet arthrosis without high-grade spinal stenosis. Mild-to-moderate multilevel neural foraminal stenosis. IMPRESSION: 1. Destructive bone lesions at  L1 and L5 consistent with metastatic disease. Suspected small volume epidural tumor in the left lateral recess at L5. 2. 2.5 cm paraspinal soft tissue mass posterior to the right L2 transverse process also concerning for metastatic disease. Electronically Signed   By: Logan Bores M.D.   On: 01/08/2021 18:16         ASSESSMENT:  1.  Presumed metastatic lung cancer: - Presentation with 3-week history of worsening weakness preceded  by a fall - CT chest PE protocol on 01/08/2021 with 9 cm right lower lobe mass with direct invasion of the segment 7 of the liver.  Lytic soft tissue metastasis in the left posterior mediastinum centered on the head and neck of the left third rib.  Mass encroaches into the left T3-T4 neural foramina.  Metastatic mediastinal lymphadenopathy.  Small right-sided pleural effusion most likely malignant.  Nonspecific right middle lobe pulmonary nodules measuring 0.6 and 0.2 cm. - CT [facility] on 01/08/2021 showed lytic lesion with associated soft tissue mass at L1 and additional lytic lesion at L5 vertebral body.  5.5 cm enhancing soft tissue mass in the left gluteus muscle.  Small hypodense right paraspinal soft tissue mass posterior to the transverse process of L2.  2.  Social/family history: - He worked in [employer].  He also worked at [employer] and had exposure to dyes and cotton fibers.  He smoked in the past and quit 40 years ago. 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 - He had prostate cancer T1c, Gleason score 6 treated with seed implants in March 2022. - He lives at home with his wife.  His wife worked as a Marine scientist at [employer] in [place]. - No major family history of malignancies.  PLAN:  1.  Presumed metastatic lung cancer: - I have reviewed images with the patient and his daughter who was at bedside. - Recommend biopsy of the right lung lesion or left gluteal lesion. - Recommend MRI of the thoracic spine with and without contrast to evaluate for any epidural masses.  2.  Malignant hypercalcemia: - His calcium level is 10.6 with albumin 2.0.  Corrected calcium is about 12. - Recommend zoledronic acid 4 mg IV along with aggressive hydration.  3.  T1c prostate cancer: - He had seed implants in March 2022. - Latest PSA was 2.6 on 10/31/2020.  All questions were answered. The patient knows to call the clinic with  any problems, questions or concerns. We can certainly see the patient much sooner if necessary.    Derek Jack

## 2021-01-09 NOTE — Progress Notes (Signed)
Could not finish Admission questions.

## 2021-01-09 NOTE — Progress Notes (Signed)
Pt arrived from [facility], [facility], complaining of pain 6/10 in back.  No orders to release. CHG and bath completed, tele initiated on [id number].  Oriented patient to room and unit, call bell in reach and bed in lowest locked position.

## 2021-01-10 ENCOUNTER — Inpatient Hospital Stay (HOSPITAL_COMMUNITY): Payer: Medicare PPO

## 2021-01-10 DIAGNOSIS — L8990 Pressure ulcer of unspecified site, unspecified stage: Secondary | ICD-10-CM | POA: Diagnosis present

## 2021-01-10 DIAGNOSIS — J9601 Acute respiratory failure with hypoxia: Secondary | ICD-10-CM | POA: Diagnosis not present

## 2021-01-10 DIAGNOSIS — C7951 Secondary malignant neoplasm of bone: Secondary | ICD-10-CM | POA: Diagnosis not present

## 2021-01-10 LAB — PROTIME-INR
INR: 1.5 — ABNORMAL HIGH (ref 0.8–1.2)
Prothrombin Time: 18 seconds — ABNORMAL HIGH (ref 11.4–15.2)

## 2021-01-10 LAB — CBC
HCT: 29.8 % — ABNORMAL LOW (ref 39.0–52.0)
Hemoglobin: 9.3 g/dL — ABNORMAL LOW (ref 13.0–17.0)
MCH: 27.3 pg (ref 26.0–34.0)
MCHC: 31.2 g/dL (ref 30.0–36.0)
MCV: 87.4 fL (ref 80.0–100.0)
Platelets: 407 10*3/uL — ABNORMAL HIGH (ref 150–400)
RBC: 3.41 MIL/uL — ABNORMAL LOW (ref 4.22–5.81)
RDW: 15.8 % — ABNORMAL HIGH (ref 11.5–15.5)
WBC: 19 10*3/uL — ABNORMAL HIGH (ref 4.0–10.5)
nRBC: 0 % (ref 0.0–0.2)

## 2021-01-10 LAB — COMPREHENSIVE METABOLIC PANEL
ALT: 15 U/L (ref 0–44)
AST: 14 U/L — ABNORMAL LOW (ref 15–41)
Albumin: 1.6 g/dL — ABNORMAL LOW (ref 3.5–5.0)
Alkaline Phosphatase: 124 U/L (ref 38–126)
Anion gap: 6 (ref 5–15)
BUN: 17 mg/dL (ref 8–23)
CO2: 26 mmol/L (ref 22–32)
Calcium: 10.4 mg/dL — ABNORMAL HIGH (ref 8.9–10.3)
Chloride: 105 mmol/L (ref 98–111)
Creatinine, Ser: 0.7 mg/dL (ref 0.61–1.24)
GFR, Estimated: >60 mL/min (ref >60)
Glucose, Bld: 81 mg/dL (ref 70–99)
Potassium: 3.3 mmol/L — ABNORMAL LOW (ref 3.5–5.1)
Sodium: 137 mmol/L (ref 135–145)
Total Bilirubin: 1.2 mg/dL (ref 0.3–1.2)
Total Protein: 4.7 g/dL — ABNORMAL LOW (ref 6.5–8.1)

## 2021-01-10 LAB — MAGNESIUM: Magnesium: 1.9 mg/dL (ref 1.7–2.4)

## 2021-01-10 IMAGING — CT CT BIOPY CORE LUNG/MEDIASTINUM
1 of 3 series · 14 of 32 positions shown, 19 images · non-contrast
Comparison: CT chest from [DATE]

INDICATION: 75-year-old male with indeterminate right lower lobe lung mass.

EXAM:
CT-guided lung biopsy

[Series 2: i-spiral 5.0 b40f · axial · 0.78mm/px · z∈[+1307,+1611]mm · 14 of 99 slices shown, 19 images]
[im 6/99  soft-tissue]
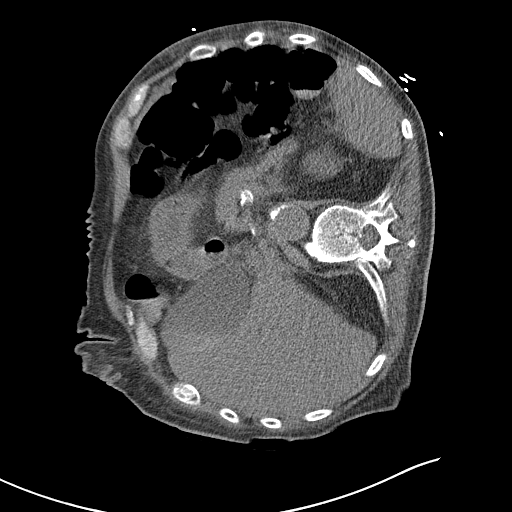
[im 6/99  bone]
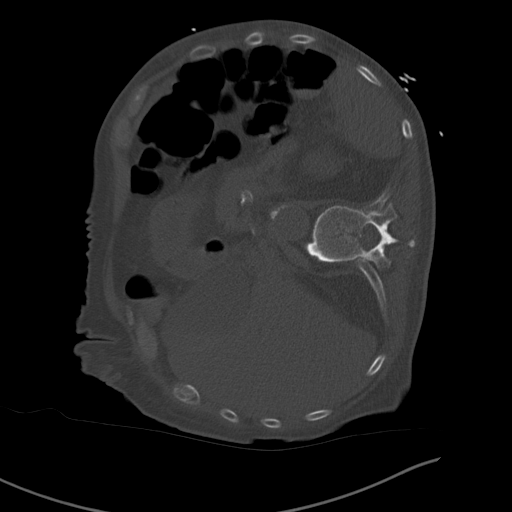
[im 12/99  soft-tissue]
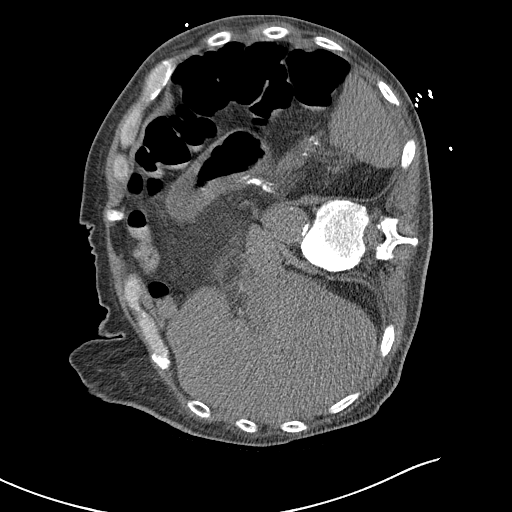
[im 24/99  soft-tissue]
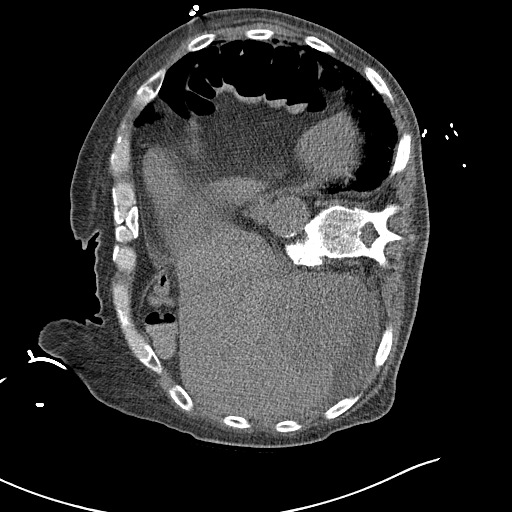
[im 29/99  soft-tissue]
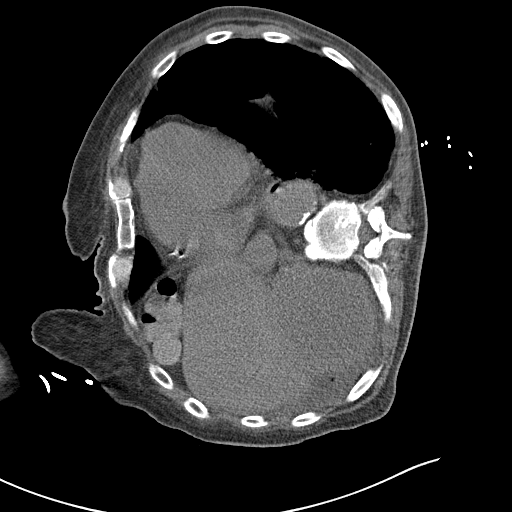
[im 35/99  soft-tissue]
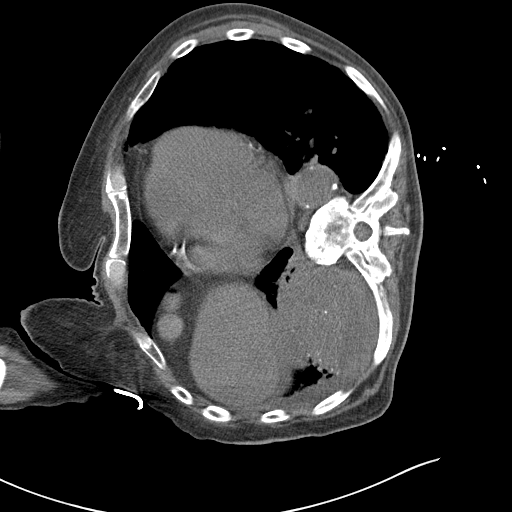
[im 41/99  soft-tissue]
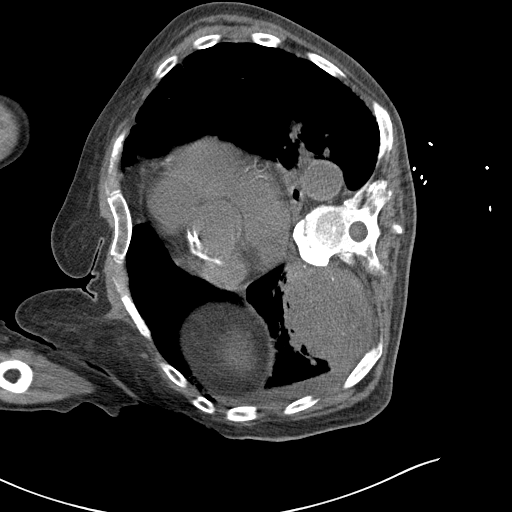
[im 52/99  soft-tissue]
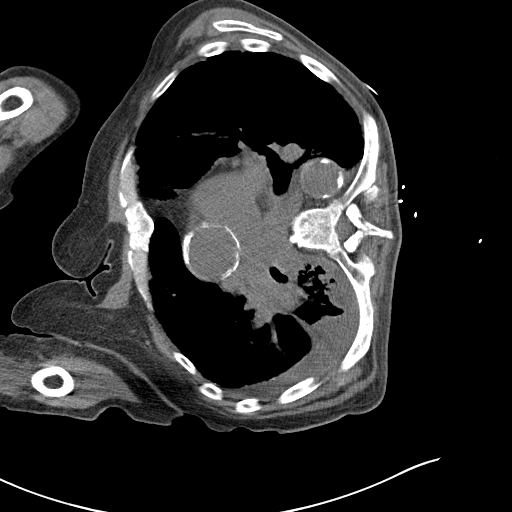
[im 58/99  soft-tissue]
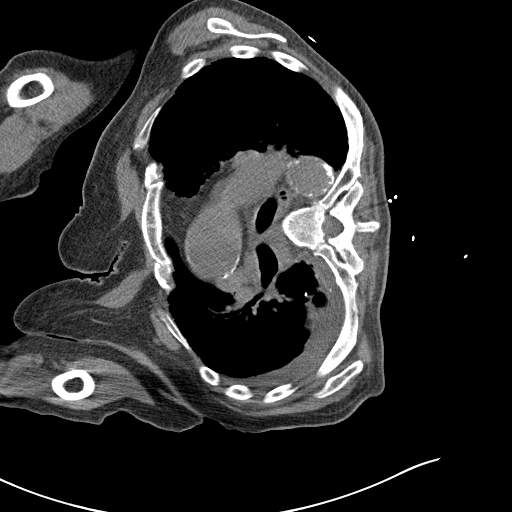
[im 64/99  soft-tissue]
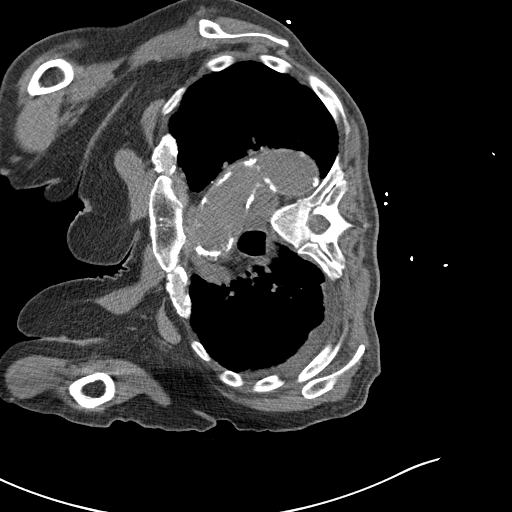
[im 64/99  bone]
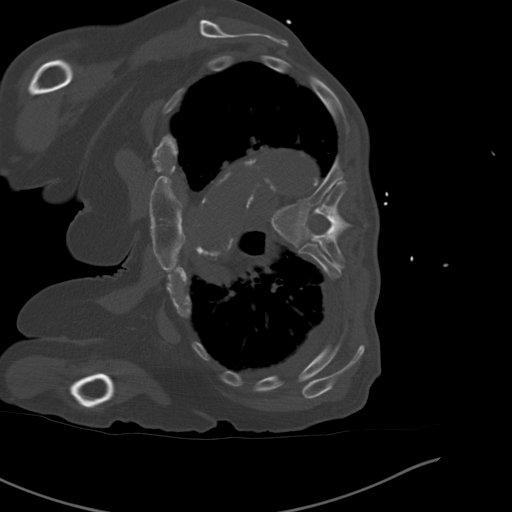
[im 70/99  soft-tissue]
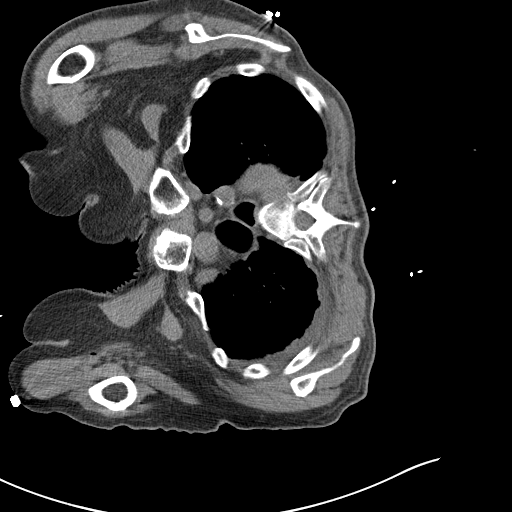
[im 75/99  soft-tissue]
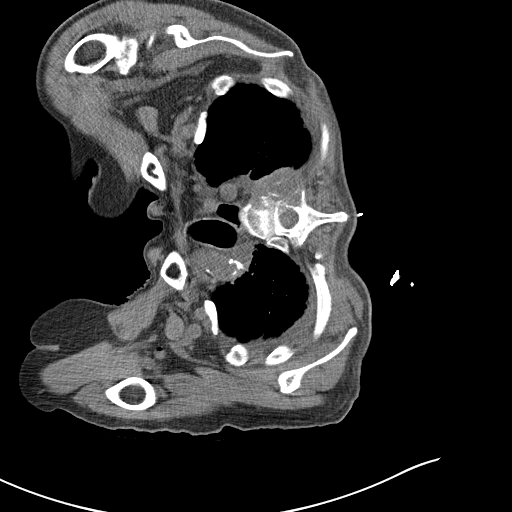
[im 75/99  lung]
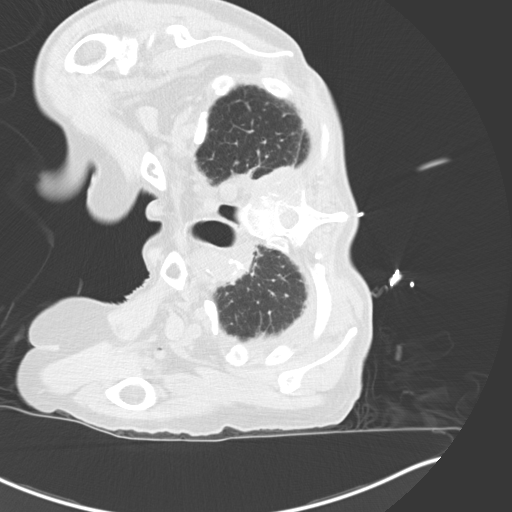
[im 81/99  lung]
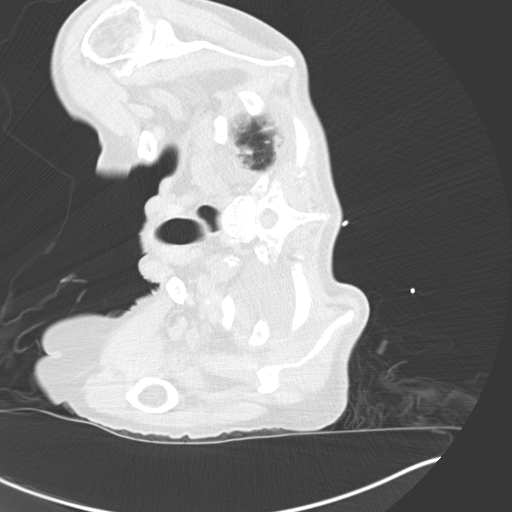
[im 87/99  soft-tissue]
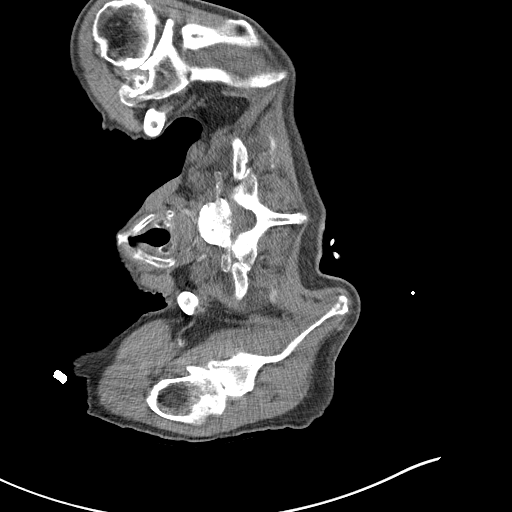
[im 87/99  lung]
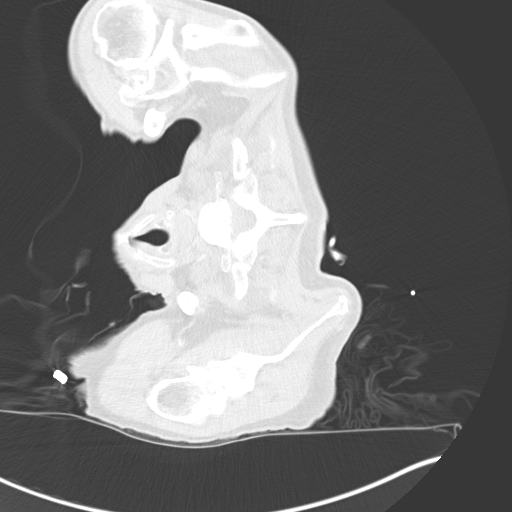
[im 93/99  soft-tissue]
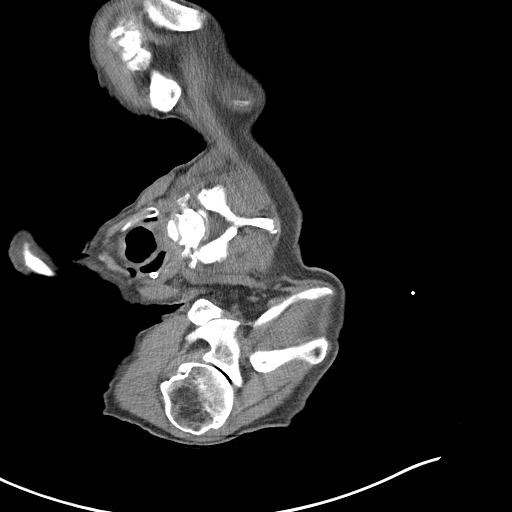
[im 93/99  lung]
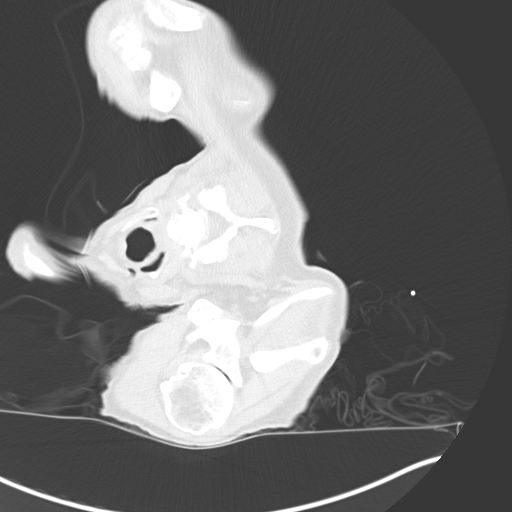

[14 of 32 positions shown; findings below may reference images not displayed]

MEDICATIONS:
None.

ANESTHESIA/SEDATION:
Fentanyl 50 mcg IV; Versed 1 mg IV

Sedation time: 13 minutes; The patient was continuously monitored
during the procedure by the interventional radiology nurse under my
direct supervision.

CONTRAST:  None

COMPLICATIONS:
None immediate.

PROCEDURE:
Informed consent was obtained from the patient following an
explanation of the procedure, risks, benefits and alternatives. The
patient understands,agrees and consents for the procedure. All
questions were addressed. A time out was performed prior to the
initiation of the procedure.

The patient was positioned in the left lateral decubitus position on
the CT table and a limited chest CT was performed for procedural
planning demonstrating similar-appearing right lower lobe mass. The
operative site was prepped and draped in the usual sterile fashion.
Under sterile conditions and local anesthesia, a 17 gauge coaxial
needle was advanced into the peripheral aspect of the nodule.
Positioning was confirmed with intermittent CT fluoroscopy and
followed by the acquisition of total of 3 core samples with an 18
gauge core needle biopsy device. The coaxial needle was removed
following deployment of a Biosentry plug and superficial hemostasis
was achieved with manual compression.

Limited post procedural chest CT was negative for pneumothorax or
additional complication. A dressing was placed. The patient
tolerated the procedure well without immediate postprocedural
complication. The patient was escorted to have an upright chest
radiograph.
IMPRESSION: Technically successful CT guided core needle core biopsy of right
lower lung mass.

## 2021-01-10 IMAGING — DX DG CHEST 1V PORT
1 series · 1 of 1 positions shown · non-contrast
Comparison: CT chest [DATE]

CLINICAL DATA: Status post biopsy of right lower lobe lung mass.

EXAM:
PORTABLE CHEST 1 VIEW

[chest]
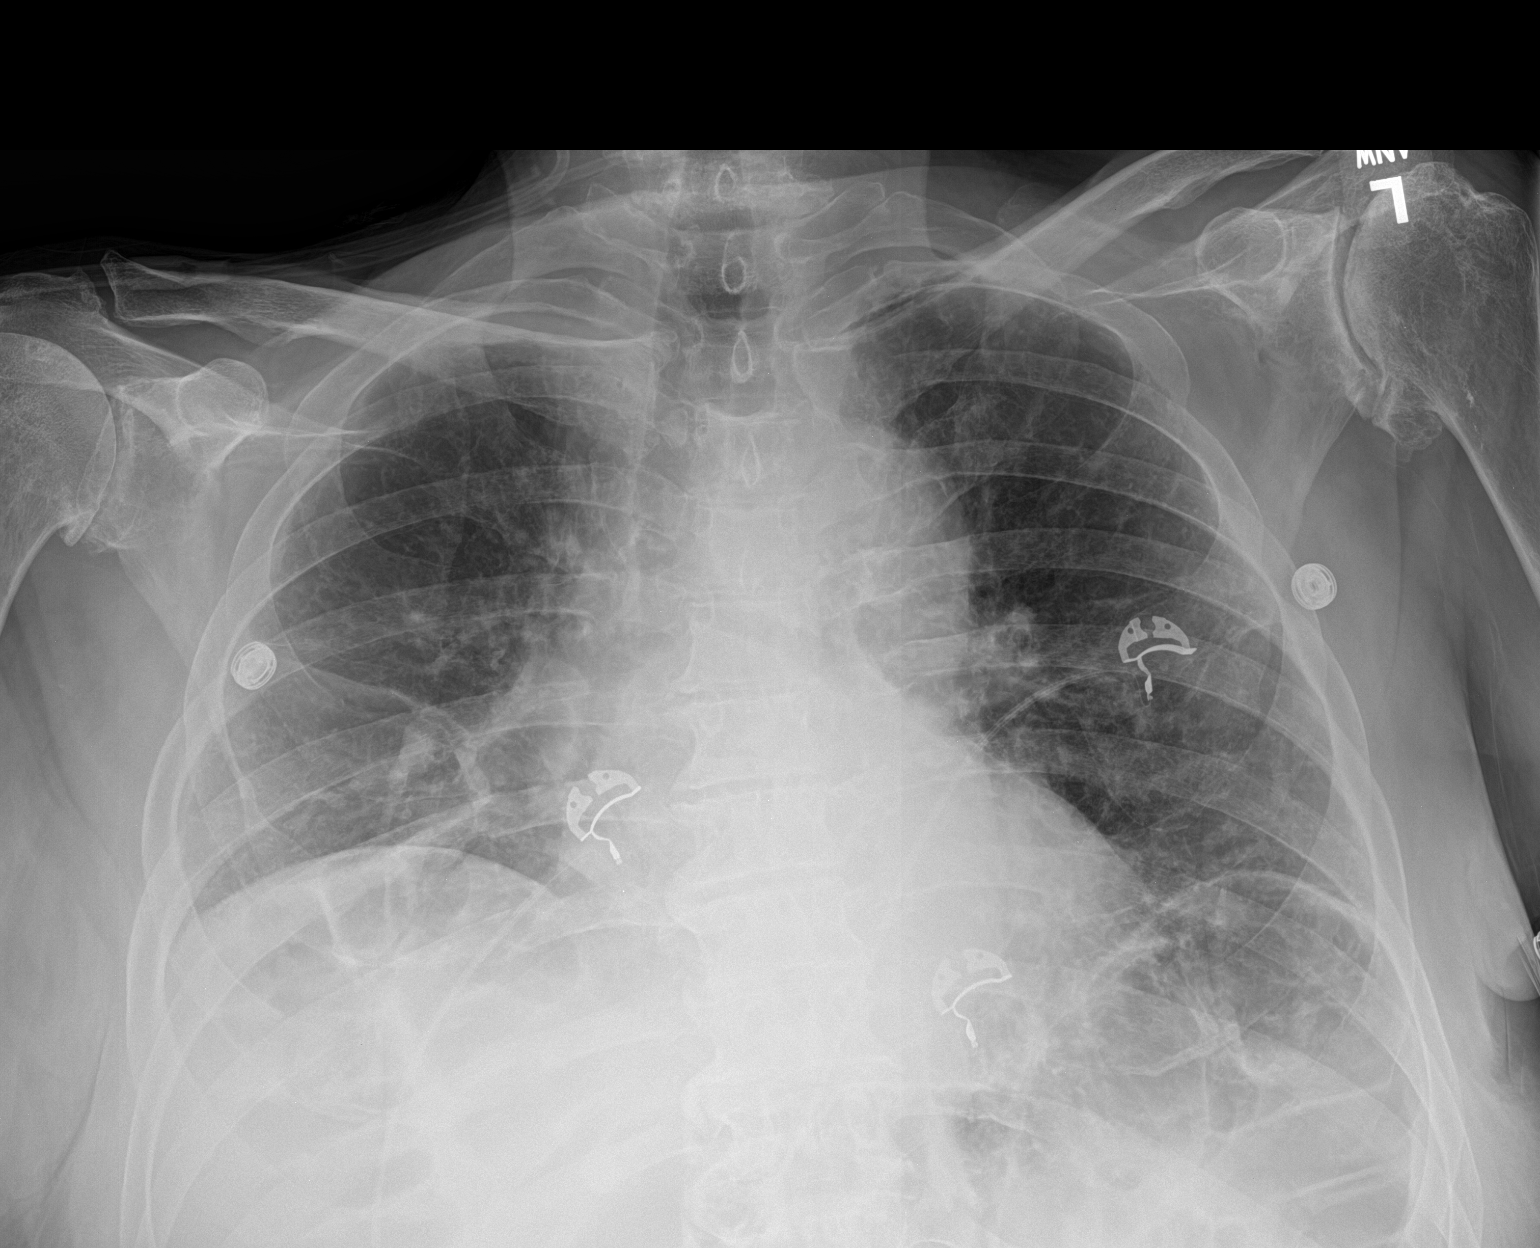

[1 of 1 positions shown; findings below may reference images not displayed]

FINDINGS: Intentionally extra [REDACTED] image of the chest demonstrates no
pneumothorax. Low lung volumes. Bandlike density along the right mid
lung attributable to fluid in the minor fissure. Retro diaphragmatic
density on the right compatible with known right lower lobe lesion.
Stable mild peripheral interstitial accentuation in the lungs.
Atherosclerotic calcification of the aortic arch. Degenerative
glenohumeral arthropathy, left greater than right, with some volume
loss in the left humeral head. Thoracic spondylosis noted.

The known lytic lesion involving the left third rib and left third
and fourth vertebra is relatively difficult to visualize on today's
conventional radiograph.
IMPRESSION: 1. No pneumothorax.
2. Trace fluid in the right minor fissure.
3. Mild chronic interstitial accentuation in the lung periphery.
4. Left greater than right glenohumeral arthropathy.
5.  Aortic Atherosclerosis ([RN]-[RN]).
6. Retro diaphragmatic density on the right compatible with known
mass.

## 2021-01-10 MED ORDER — MIDAZOLAM HCL 2 MG/2ML IJ SOLN
INTRAMUSCULAR | Status: DC | PRN
  Administered 2021-01-10: 1 mg via INTRAVENOUS

## 2021-01-10 MED ORDER — LIDOCAINE HCL 1 % IJ SOLN
INTRAMUSCULAR | Status: AC
  Filled 2021-01-10: qty 10

## 2021-01-10 MED ORDER — OXYCODONE HCL 5 MG PO TABS
5.0000 mg | ORAL_TABLET | ORAL | Status: DC | PRN
  Filled 2021-01-10: qty 2

## 2021-01-10 MED ORDER — SALINE SPRAY 0.65 % NA SOLN
1.0000 | NASAL | Status: DC | PRN
  Administered 2021-01-10: 1 via NASAL
  Filled 2021-01-10: qty 44

## 2021-01-10 MED ORDER — FENTANYL CITRATE (PF) 100 MCG/2ML IJ SOLN
INTRAMUSCULAR | Status: DC | PRN
  Administered 2021-01-10: 25 ug via INTRAVENOUS

## 2021-01-10 MED ORDER — MIDAZOLAM HCL 2 MG/2ML IJ SOLN
INTRAMUSCULAR | Status: AC
  Filled 2021-01-10: qty 2

## 2021-01-10 MED ORDER — FENTANYL CITRATE (PF) 100 MCG/2ML IJ SOLN
INTRAMUSCULAR | Status: AC
  Filled 2021-01-10: qty 2

## 2021-01-10 MED ORDER — HEPARIN SODIUM (PORCINE) 5000 UNIT/ML IJ SOLN
5000.0000 [IU] | Freq: Three times a day (TID) | INTRAMUSCULAR | Status: DC
  Administered 2021-01-11 – 2021-01-24 (×38): 5000 [IU] via SUBCUTANEOUS
  Filled 2021-01-10 (×36): qty 1

## 2021-01-10 MED ORDER — HYDRALAZINE HCL 25 MG PO TABS
25.0000 mg | ORAL_TABLET | Freq: Four times a day (QID) | ORAL | Status: DC | PRN
  Administered 2021-01-18: 25 mg via ORAL
  Filled 2021-01-10: qty 1

## 2021-01-10 MED ORDER — ACETAMINOPHEN 325 MG PO TABS
650.0000 mg | ORAL_TABLET | Freq: Four times a day (QID) | ORAL | Status: DC | PRN
  Administered 2021-01-10: 650 mg via ORAL
  Filled 2021-01-10 (×2): qty 2

## 2021-01-10 MED ORDER — MORPHINE SULFATE (PF) 4 MG/ML IV SOLN
4.0000 mg | INTRAVENOUS | Status: DC | PRN
  Administered 2021-01-10 – 2021-01-11 (×3): 4 mg via INTRAVENOUS
  Filled 2021-01-10 (×3): qty 1

## 2021-01-10 MED ORDER — POTASSIUM CHLORIDE CRYS ER 20 MEQ PO TBCR
40.0000 meq | EXTENDED_RELEASE_TABLET | Freq: Once | ORAL | Status: AC
  Administered 2021-01-10: 40 meq via ORAL
  Filled 2021-01-10: qty 2

## 2021-01-10 NOTE — Progress Notes (Signed)
This chaplain responded to PMT consult for spiritual care and creating/updating the Pt. Advance Directive.    The Pt. recently returned from his procedure at the time of the chaplain's visit.  The Pt. wife-Lynn and daughter-[name] are at the Pt. bedside.  The chaplain is appreciative of the RN-Kristi's presence.  This chaplain began rapport building with the Pt. and family as a member of the PMT. The chaplain understands the Pt. is tired and has declined any further discussions with himself for today. The chaplain used her reflective listening skills to understand the Pt. transition from [facility] and begin to navigate [name]'s questions.    The chaplain understands [name] is requesting to be put at the top of Pt. emergency contacts because of the Pt. wife-Lynn declining decisional ability.  The chaplain and family agree for the chaplain to return tomorrow to continue AD education and designation of HCPOA.  [name] is requesting a visit from a SW/CM for discharge coordination of a bed and oxygen.   The chaplain understands [name] is requesting a Palliative provider visit to continue the relationship started at [facility] Most specifically for pain management and information on completing a MOST form.  The chaplain understands F/U spiritual care will occur on Friday.

## 2021-01-10 NOTE — Procedures (Signed)
Interventional Radiology Procedure Note  Procedure: CT guided right lower lobe lung mass biopsy  Findings: Please refer to procedural dictation for full description. 18 ga core x 3.  Biosentry device deployment.  Complications: None immediate  Estimated Blood Loss: < 5 mL  Recommendations: Portable chest radiograph in 1 hour. Bedrest and NPO until radiograph complete and cleared. Follow up Pathology results.   Ruthann Cancer, MD Pager: [phone]

## 2021-01-10 NOTE — Progress Notes (Signed)
Palliative:  I called and spoke with daughter, [name]. [name] shares that she and her mother are leaving the hospital to allow for time for her father to rest. She states that he has had some pain that makes him grumpy and he needs rest. I told [name] that I will ask one of my colleagues to check in and help talk them through MOST form as requested. [name] has no further questions/concerns at this time. They will continue to gain more information about diagnosis and prognosis in coming days which I anticipate will alter their decisions made for advance directives.   No charge  Vinie Sill, NP Palliative Medicine Team Pager [phone] (Please see [email or web address] for schedule) Team Phone [phone]

## 2021-01-10 NOTE — Progress Notes (Signed)
PT Cancellation Note  Patient Details Name: VALENTIN BENNEY MRN: [id number] DOB: 04-18-1946   Cancelled Treatment:    Reason Eval/Treat Not Completed: Medical issues which prohibited therapy;Patient not medically ready;Patient declined, no reason specified.  RN and pt express too much pain to be able to participate today.  Will try to evaluate 9/9 as able.    Tessie Fass Evolett [place] 01/10/2021, 5:29 PM

## 2021-01-10 NOTE — Progress Notes (Signed)
PROGRESS NOTE    Cody Carr   DUK:[id number]  DOB: 21-Sep-1945  PCP: Monico Blitz, MD    DOA: 01/08/2021 LOS: 1    Brief Narrative / Hospital Course to Date:   75 y.o. male, with history of nephrolithiasis, hyperlipidemia, hypertension, prostate cancer with seeds, throat cancer status post chemoradiation per patient, and more presented the [facility] ED with a chief complaint of back pain, constipation, and poor p.o. intake.  CT showed right-sided pleural effusion, large necrotic appearing mass in the posterior right lower lobe appearing to cross the right diaphragm extending into adjacent liver.  Imaging also revealed mediastinal lymphadenopathy, lytic lesions in the lumbar spine concerning for metastatic disease enhancing soft tissues mass in the left gluteal muscle, bilateral renal lesions.  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  Patient was seen by oncology.  Transferred to [facility] for IR to obtain tissue for biopsy.   Assessment & Plan   Active Problems:   Acute respiratory failure with hypoxia (HCC)   Metastatic cancer (HCC)   CAP (community acquired pneumonia)   Malignant pleural effusion   Constipation   Hypercalcemia   Protein-calorie malnutrition, moderate (HCC)   Acute respiratory distress   Malignant neoplasm of right lung (HCC)   Pressure injury of skin   Acute respiratory failure with hypoxia secondary to postobstructive pneumonia versus atelectasis --Supplement O2 to maintain sats above 90% --Continue empiric antibiotics started on admission --Pulmonary hygiene with I-S and flutter --Monitor Pro-Cal --Bronchodilators as needed  Metastatic disease -newly diagnosed -appears to have primary lung malignancy. Right lower lobe lung mass IR performed CT-guided lung biopsy today 9/8 --Follow-up pathology --Oncology consulted, patient seen by Dr. Delton Coombes at [place] follow-up plans --Palliative care  following  Malignant hypercalcemia -most likely secondary to skeletal lytic lesions.  Treated with Zometa on 9/7. --Continue aggressive IV fluids  Right pleural effusion, small - most likely malignant.  Further evaluation per oncology.  Will consider thoracentesis if difficulty weaning off O2.  Community-acquired pneumonia vs postobstructive pneumonia/atelectasis -continue empiric Rocephin and Zithromax.  Mucinex.  Pulmonary hygiene.  Follow-up procalcitonin trend.  Constipation -likely opioid induced. --Bowel regimen per orders, enemas if needed  Moderate protein calorie malnutrition -encourage p.o. intake and continue supplements.   --Consult dietitian  Hyperlipidemia -continue statin  Hypertension -continue lisinopril.  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  Further complicates lung malignancy and overall prognosis unfortunately.   Patient BMI: There is no height or weight on file to calculate BMI.   DVT prophylaxis: heparin injection 5,000 Units Start: 01/11/21 0600 SCDs Start: 01/08/21 2224   Diet:  Diet Orders (From admission, onward)     Start     Ordered   01/10/21 0546  Diet NPO time specified Except for: Sips with Meds  Diet effective now       Question:  Except for  Answer:  Ferrel Logan with Meds   01/10/21 0546              Code Status: Full Code   Subjective 01/10/21    Patient seen at bedside after undergoing lung biopsy today.  Met with his wife and daughter in the room earlier this morning on rounds.  Patient is awake and alert, requesting pain medication.  He he has very poor vocal projection due to dry mucous membranes from being n.p.o. tolerated biopsy procedure well.  No other acute complaints.   Disposition Plan & Communication   Status is: Inpatient  Remains inpatient appropriate because:IV treatments appropriate due to intensity of  illness or inability to take PO  Dispo: The patient is from: Home              Anticipated d/c is to: Home              Patient currently is not medically stable to d/c.   Difficult to place patient No   Family Communication: Wife and daughter at bedside on rounds today   Consults, Procedures, Significant Events   Consultants:  Interventional radiology Oncology Palliative care  Procedures:  CT-guided right lower lobe lung biopsy 01/11/2019  Antimicrobials:  Anti-infectives (From admission, onward)    Start     Dose/Rate Route Frequency Ordered Stop   01/09/21 2000  cefTRIAXone (ROCEPHIN) 2 g in sodium chloride 0.9 % 100 mL IVPB        2 g 200 mL/hr over 30 Minutes Intravenous Every 24 hours 01/08/21 2224 01/14/21 1959   01/09/21 2000  azithromycin (ZITHROMAX) 500 mg in sodium chloride 0.9 % 250 mL IVPB        500 mg 250 mL/hr over 60 Minutes Intravenous Every 24 hours 01/08/21 2224 01/14/21 1959   01/08/21 1930  cefTRIAXone (ROCEPHIN) 1 g in sodium chloride 0.9 % 100 mL IVPB        1 g 200 mL/hr over 30 Minutes Intravenous  Once 01/08/21 1929 01/08/21 2053   01/08/21 1930  azithromycin (ZITHROMAX) 500 mg in sodium chloride 0.9 % 250 mL IVPB        500 mg 250 mL/hr over 60 Minutes Intravenous  Once 01/08/21 1929 01/08/21 2219         Micro    Objective   Vitals:   01/10/21 1220 01/10/21 1225 01/10/21 1240 01/10/21 1255  BP: (!) 156/72 (!) 164/68 (!) 175/74 (!) 168/85  Pulse: 66 74 74 77  Resp: 12 20 (!) 24 20  Temp:      TempSrc:      SpO2: 100% 95% 99% 98%    Intake/Output Summary (Last 24 hours) at 01/10/2021 1458 Last data filed at 01/10/2021 0839 Gross per 24 hour  Intake 1292.31 ml  Output 1000 ml  Net 292.31 ml   There were no vitals filed for this visit.  Physical Exam:  General exam: awake, alert, no acute distress, underweight HEENT: Dry mucus membranes, hearing grossly normal  Respiratory system: Diminished throughout worse at right base, no  expiratory wheezes, on 2 L/min nasal cannula oxygen, normal respiratory effort Cardiovascular system: normal S1/S2, RRR, no pedal edema.   Gastrointestinal system: soft, nontender nondistended abdomen. Central nervous system: A&O x3. no gross focal neurologic deficits, normal speech Skin: dry, intact, normal temperature, no rashes, lesions or ulcers seen on visualized skin Psychiatry: normal mood, congruent affect, judgement and insight appear normal  Labs   Data Reviewed: I have personally reviewed following labs and imaging studies  CBC: Recent Labs  Lab 01/08/21 1532 01/09/21 0522 01/09/21 1850 01/10/21 0126  WBC 22.9* 21.3* 23.3* 19.0*  NEUTROABS 20.6* 19.0*  --   --   HGB 11.8* 10.7* 10.5* 9.3*  HCT 38.0* 35.6* 34.6* 29.8*  MCV 87.2 88.3 87.8 87.4  PLT 563* 535* 529* 211*   Basic Metabolic Panel: Recent Labs  Lab 01/08/21 1532 01/09/21 0522 01/10/21 0126  NA 136 143 137  K 3.7 3.6 3.3*  CL 101 107 105  CO2 _0 GLUCOSE 93 85 81  BUN 28* 24* 17  CREATININE 0.73 0.68 0.70  CALCIUM 10.7* 10.6* 10.4*  MG  --   --  1.9   GFR: CrCl cannot be calculated (Unknown ideal weight.). Liver Function Tests: Recent Labs  Lab 01/08/21 1532 01/09/21 0522 01/10/21 0126  AST 19 15 14*  ALT _1 ALKPHOS 175* 157* 124  BILITOT 1.3* 1.0 1.2  PROT 5.7* 5.3* 4.7*  ALBUMIN 2.2* 2.0* 1.6*   Recent Labs  Lab 01/08/21 1532  LIPASE 24   No results for input(s): AMMONIA in the last 168 hours. Coagulation Profile: Recent Labs  Lab 01/10/21 0804  INR 1.5*   Cardiac Enzymes: No results for input(s): CKTOTAL, CKMB, CKMBINDEX, TROPONINI in the last 168 hours. BNP (last 3 results) No results for input(s): PROBNP in the last 8760 hours. HbA1C: No results for input(s): HGBA1C in the last 72 hours. CBG: No results for input(s): GLUCAP in the last 168 hours. Lipid Profile: No results for input(s): CHOL, HDL, LDLCALC, TRIG, CHOLHDL, LDLDIRECT in the last 72  hours. Thyroid Function Tests: Recent Labs    01/09/21 0857  TSH 1.854   Anemia Panel: No results for input(s): VITAMINB12, FOLATE, FERRITIN, TIBC, IRON, RETICCTPCT in the last 72 hours. Sepsis Labs: Recent Labs  Lab 01/09/21 0857  PROCALCITON 0.30    Recent Results (from the past 240 hour(s))  SARS CORONAVIRUS 2 (TAT 6-24 HRS) Nasopharyngeal Nasopharyngeal Swab     Status: None   Collection Time: 01/08/21  9:27 PM   Specimen: Nasopharyngeal Swab  Result Value Ref Range Status   SARS Coronavirus 2 NEGATIVE NEGATIVE Final    Comment: (NOTE) SARS-CoV-2 target nucleic acids are NOT DETECTED.  The SARS-CoV-2 RNA is generally detectable in upper and lower respiratory specimens during the acute phase of infection. Negative results do not preclude SARS-CoV-2 infection, do not rule out co-infections with other pathogens, and should not be used as the sole basis for treatment or other patient management decisions. Negative results must be combined with clinical observations, patient history, and epidemiological information. The expected result is Negative.  Fact Sheet for Patients: SugarRoll.be  Fact Sheet for Healthcare Providers: https://www.woods-mathews.com/  This test is not yet approved or cleared by the [place] FDA and  has been authorized for detection and/or diagnosis of SARS-CoV-2 by FDA under an Emergency Use Authorization (EUA). This EUA will remain  in effect (meaning this test can be used) for the duration of the COVID-19 declaration under Se ction 564(b)(1) of the Act, 21 U.S.C. section 360bbb-3(b)(1), unless the authorization is terminated or revoked sooner.  Performed at [facility], [place] [address], [place], [place] [address]       Imaging Studies   CT Angio Chest PE W and/or Wo Contrast  Result Date: 01/08/2021 CLINICAL DATA:  Chest pain.  High probability for PE. EXAM: CT ANGIOGRAPHY CHEST WITH  CONTRAST TECHNIQUE: Multidetector CT imaging of the chest was performed using the standard protocol during bolus administration of intravenous contrast. Multiplanar CT image reconstructions and MIPs were obtained to evaluate the vascular anatomy. CONTRAST:  156m OMNIPAQUE IOHEXOL 350 MG/ML SOLN COMPARISON:  Prior CT scan of the chest 04/05/2020 FINDINGS: Cardiovascular: Satisfactory opacification of the pulmonary arteries to the segmental level. No evidence of pulmonary embolism. Normal heart size. No pericardial effusion. Fusiform aneurysmal dilation of the ascending thoracic aorta with a maximal transverse diameter of 4.5 cm. Scattered atherosclerotic calcifications throughout the aorta and  the native coronary arteries. Mediastinum/Nodes: Unremarkable thyroid gland. High right paratracheal lymph node measures 1.4 cm in short axis (image 22 series 4). Left paratracheal lymph node measures 1.5 cm in short axis (image 34 series 4). Subcarinal lymphadenopathy measures approximately 2.2 cm in short axis (image 42 series 4). Posterior left mediastinal mass measures a proximally 4.5 x 2.8 x 3.8 cm indirectly invades the head and neck of the left third rib. The mass also extends into the neural foramen. Lungs/Pleura: Approximally 9.0 x 8.0 x 8.7 cm ill-defined heterogeneous mass centered in the posteromedial aspect of the right lower lobe contains numerous punctate internal calcifications. The mass also appears to extend across the diaphragm and directly into segment 7 of the liver. There is a small and likely malignant associated right pleural effusion. 0.6 cm right middle lobe pulmonary nodule (image 73 series 6) is indeterminate. 2 mm nodule more superiorly on image 67 also noted. Peripheral subpleural reticulation, architectural distortion and honeycombing in the left lung base suggests underlying pulmonary fibrosis. Upper Abdomen: No acute abnormality. Musculoskeletal: Soft tissue lytic lesion destroying the head  and neck of the left third rib with extension into the neural foramen as described above. No additional foci of osseous metastatic disease identified. Review of the MIP images confirms the above findings. IMPRESSION: 1. Approximally 9 cm ill-defined heterogeneous right lower lobe mass appears to cross the diaphragm and directly invade segment 7 of the liver. Findings are highly concerning for advanced primary bronchogenic carcinoma. 2. Lytic soft tissue metastasis in the left posterior mediastinum centered on the head and neck of the left third rib. The mass encroaches into the left T3-T4 neural foramen. 3. Presumably metastatic mediastinal lymphadenopathy. 4. Small right-sided pleural effusion is likely malignant. 5. Nonspecific right middle lobe pulmonary nodules measuring 0.6 and 0.2 cm. Small pulmonary metastases versus incidental benign nodules. 6. 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 7. 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 Ascending thoracic aortic aneurysm. Recommend semi-annual imaging followup by CTA or MRA and referral to cardiothoracic surgery if not already obtained. This recommendation follows 2010 ACCF/AHA/AATS/ACR/ASA/SCA/SCAI/SIR/STS/SVM Guidelines for the Diagnosis and Management of Patients With Thoracic Aortic Disease. Circulation. 2010; 121: N817-R116. Aortic aneurysm NOS (ICD10-I71.9); Aortic Atherosclerosis (ICD10-I70.0). 8. Coronary artery calcifications. Electronically Signed   By: Jacqulynn Cadet M.D.   On: 01/08/2021 17:09   CT ABDOMEN PELVIS W CONTRAST  Result Date: 01/08/2021 CLINICAL DATA:  Left lower quadrant pain EXAM: CT ABDOMEN AND PELVIS WITH CONTRAST TECHNIQUE: Multidetector CT imaging of the abdomen and pelvis was performed using the standard protocol following bolus administration of intravenous contrast. CONTRAST:  119m OMNIPAQUE IOHEXOL 350 MG/ML SOLN COMPARISON:  CT 01/19/2020, chest CT 01/08/2021, FINDINGS:  Lower chest: Mild aneurysmal dilatation of the ascending aorta measuring up to 4.4 cm, heavily calcified. Extensive coronary vascular calcification. Normal cardiac size. Trace pericardial effusion. Incompletely visualized necrotic appearing subcarinal lymph node measuring up to 2.6 cm. Large necrotic appearing mass in the medial right base with punctate calcifications, this measures approximately 9.6 by 7.8 cm and extends across the right diaphragm, into the adjacent liver. Thick rimmed hypodense enhancing lesion in the right hepatic lobe measuring 3.8 cm. Small right-sided pleural effusion Hepatobiliary: No calcified gallstone. No biliary dilatation. Heterogenous enhancement of right hepatic lobe adjacent to mass lesion. Pancreas: Unremarkable. No pancreatic ductal dilatation or surrounding inflammatory changes. Spleen: Indeterminate hypoenhancing mass within the spleen measuring 2.4 cm, series 2, image 34. Adrenals/Urinary Tract: Adrenal glands are normal. Punctate  nonobstructing stones within the bilateral kidneys. Scattered renal cysts. Interim finding of 14 mm indeterminate right renal lesion at the midpole, series 2, image 52. Interim finding of indeterminate 13 mm exophytic lesion mid pole left kidney, series 2, image 44. Slightly thick-walled urinary bladder. 4 mm stone within the right posterior bladder. Stomach/Bowel: The stomach is nonenlarged. No dilated small bowel. No acute bowel wall thickening. Moderate to large stool in the rectosigmoid colon with moderate formed feces at the rectum. Negative appendix Vascular/Lymphatic: Advanced aortic atherosclerosis. No aneurysm. No suspicious nodes. Reproductive: Post treatment changes of the prostate gland Other: Presacral edema and soft tissue stranding.  No free air. Musculoskeletal: Lytic lesion and soft tissue mass involving the L1 vertebra right pedicle, lamina, vertebral body and transverse process. Soft tissue mass involves the paraspinal soft tissues as  well. 2.5 cm hypodense lesion in the right paraspinal soft tissues posterior to right transverse process at L2, series 2, image 49. Large lytic lesion L5 vertebral body. Chronic bilateral pars defect at L5. 5.5 x 4.3 cm enhancing heterogenous soft tissue mass in the left gluteus muscles, series 2, image 93. IMPRESSION: 1. Small right-sided pleural effusion with large necrotic appearing mass in the posteromedial right lower lobe that appears to traverse the right diaphragm and extends into the adjacent liver. 2. Lytic lesion with associated soft tissue mass at L1 with additional lytic lesion at L5 vertebral body consistent with metastatic disease. 5.5 cm heterogenous enhancing soft tissue mass in the left gluteus muscles also suspect for metastatic disease. Small hypodense right paraspinal soft tissue mass posterior to transverse process of L2, concerning for metastatic disease 3. Vague hypodensity within the mid spleen is indeterminate for infarct or hypodense mass lesion. 4. Interval development of bilateral indeterminate renal lesions measuring up to 13 mm. 5. Nonobstructing kidney stones.  4 mm stone in the bladder Electronically Signed   By: Donavan Foil M.D.   On: 01/08/2021 17:39   CT L-SPINE NO CHARGE  Result Date: 01/08/2021 CLINICAL DATA:  Back pain. EXAM: CT LUMBAR SPINE WITH CONTRAST TECHNIQUE: Technique: Multiplanar CT images of the lumbar spine were reconstructed from contemporary CT of the Abdomen and Pelvis. CONTRAST:  No additional COMPARISON:  CT abdomen and pelvis 01/19/2020 FINDINGS: Segmentation: 5 lumbar type vertebrae. Alignment: Normal. Vertebrae: Chronic bilateral L5 pars defects. 5 cm destructive mass involving the right-sided posterior elements and posterior vertebral body of L1 mildly encroaching upon the right L1-2 neural foramen. 3.7 cm lytic lesion in the left aspect of the L5 vertebral body with focal disruption of the posterior vertebral body cortex and likely small volume  epidural tumor in the left lateral recess. Preserved vertebral body heights. Paraspinal and other soft tissues: 2.5 cm hypodense mass in the paraspinal soft tissues posterior to the right L2 transverse process. Intra-abdominal and pelvic contents reported separately. Disc levels: Mild lumbar spondylosis and facet arthrosis without high-grade spinal stenosis. Mild-to-moderate multilevel neural foraminal stenosis. IMPRESSION: 1. Destructive bone lesions at L1 and L5 consistent with metastatic disease. Suspected small volume epidural tumor in the left lateral recess at L5. 2. 2.5 cm paraspinal soft tissue mass posterior to the right L2 transverse process also concerning for metastatic disease. Electronically Signed   By: Logan Bores M.D.   On: 01/08/2021 18:16   DG Chest Port 1 View  Result Date: 01/10/2021 CLINICAL DATA:  Status post biopsy of right lower lobe lung mass. EXAM: PORTABLE CHEST 1 VIEW COMPARISON:  CT chest 01/08/2021 FINDINGS: Intentionally extra tori image of the  chest demonstrates no pneumothorax. Low lung volumes. Bandlike density along the right mid lung attributable to fluid in the minor fissure. Retro diaphragmatic density on the right compatible with known right lower lobe lesion. Stable mild peripheral interstitial accentuation in the lungs. Atherosclerotic calcification of the aortic arch. Degenerative glenohumeral arthropathy, left greater than right, with some volume loss in the left humeral head. Thoracic spondylosis noted. The known lytic lesion involving the left third rib and left third and fourth vertebra is relatively difficult to visualize on today's conventional radiograph. IMPRESSION: 1. No pneumothorax. 2. Trace fluid in the right minor fissure. 3. Mild chronic interstitial accentuation in the lung periphery. 4. Left greater than right glenohumeral arthropathy. 5.  Aortic Atherosclerosis (ICD10-I70.0). 6. Retro diaphragmatic density on the right compatible with known mass.  Electronically Signed   By: Van Clines M.D.   On: 01/10/2021 14:51   CT LUNG MASS BIOPSY  Result Date: 01/10/2021 INDICATION: 75 year old male with indeterminate right lower lobe lung mass. EXAM: CT-guided lung biopsy COMPARISON:  CT chest from 01/08/2021 MEDICATIONS: None. ANESTHESIA/SEDATION: Fentanyl 50 mcg IV; Versed 1 mg IV Sedation time: 13 minutes; The patient was continuously monitored during the procedure by the interventional radiology nurse under my direct supervision. CONTRAST:  None COMPLICATIONS: None immediate. PROCEDURE: Informed consent was obtained from the patient following an explanation of the procedure, risks, benefits and alternatives. The patient understands,agrees and consents for the procedure. All questions were addressed. A time out was performed prior to the initiation of the procedure. The patient was positioned in the left lateral decubitus position on the CT table and a limited chest CT was performed for procedural planning demonstrating similar-appearing right lower lobe mass. The operative site was prepped and draped in the usual sterile fashion. Under sterile conditions and local anesthesia, a 17 gauge coaxial needle was advanced into the peripheral aspect of the nodule. Positioning was confirmed with intermittent CT fluoroscopy and followed by the acquisition of total of 3 core samples with an 18 gauge core needle biopsy device. The coaxial needle was removed following deployment of a [employer] plug and superficial hemostasis was achieved with manual compression. Limited post procedural chest CT was negative for pneumothorax or additional complication. A dressing was placed. The patient tolerated the procedure well without immediate postprocedural complication. The patient was escorted to have an upright chest radiograph. IMPRESSION: Technically successful CT guided core needle core biopsy of right lower lung mass. Ruthann Cancer, MD Vascular and Interventional Radiology  Specialists [facility] Electronically Signed   By: Ruthann Cancer M.D.   On: 01/10/2021 13:47     Medications   Scheduled Meds:  feeding supplement  237 mL Oral BID BM   [START ON 01/11/2021] heparin  5,000 Units Subcutaneous Q8H   lidocaine       lisinopril  2.5 mg Oral QPM   polyethylene glycol  17 g Oral BID   potassium chloride  40 mEq Oral Once   rosuvastatin  5 mg Oral QPM   senna-docusate  2 tablet Oral BID   Continuous Infusions:  sodium chloride 20 mL/hr at 01/09/21 2118   azithromycin 500 mg (01/09/21 2118)   cefTRIAXone (ROCEPHIN)  IV 2 g (01/09/21 2034)       LOS: 1 day    Time spent: 40 minutes with greater than 50% spent at bedside and coordinating care    Ezekiel Slocumb, DO [facility]  01/10/2021, 2:58 PM      If 7PM-7AM, please contact night-coverage. How to  contact the [facility] Attending or Consulting provider [place] or covering provider during after hours [place], for this patient?    Check the care team in [facility] and look for a) attending/consulting [facility] provider listed and b) the [facility] team listed Log into [email or web address] and use [place] universal password to access. If you do not have the password, please contact the hospital operator. Locate the [facility] provider you are looking for under [facility] and page to a number that you can be directly reached. If you still have difficulty reaching the provider, please page the [facility] (Director on Call) for the Hospitalists listed on amion for assistance.

## 2021-01-10 NOTE — Consult Note (Addendum)
Chief Complaint: Patient was seen in consultation today for right lung needle aspiration at the request of Dr. Derek Jack.  Supervising Physician: Ruthann Cancer  Patient Status: [facility] - In-pt  History of Present Illness: Cody Carr is a 75 y.o. male presents with both wife and daughter and is being worked up for metastatic lung cancer.  He has had increased weakness, elevated calcium, and right lower lobe mass with invasion of the liver according to CT on 01/08/21.  Patient was transferred from [facility] for biopsy procedure.   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   treated with Chemo and Radiation    Past Surgical History:  Procedure Laterality Date   dental implants  2008   EXTRACORPOREAL SHOCK WAVE LITHOTRIPSY Right 03/06/2020   Procedure: EXTRACORPOREAL SHOCK WAVE LITHOTRIPSY (ESWL);  Surgeon: Cleon Gustin, MD;  Location: [facility];  Service: Urology;  Laterality: Right;   EYE SURGERY Bilateral 2021   ioc lens for cataracts   RADIOACTIVE SEED IMPLANT N/A 07/27/2020   Procedure: RADIOACTIVE SEED IMPLANT/BRACHYTHERAPY IMPLANT;  Surgeon: Irine Seal, MD;  Location: [facility];  Service: Urology;  Laterality: N/A;  63 SEEDS   SPACE OAR INSTILLATION N/A 07/27/2020   Procedure: SPACE OAR INSTILLATION;  Surgeon: Irine Seal, MD;  Location: [facility];  Service: Urology;  Laterality: N/A;   throat biopsy  2008    Allergies: Patient has no known allergies.  Medications: Prior to Admission medications   Medication Sig Start Date End Date Taking? Authorizing Provider  acetaminophen (TYLENOL) 500 MG tablet Take 1,000 mg by mouth every 6 (six) hours as needed.   Yes [provider]  aspirin 81 MG EC tablet Take 81 mg by mouth daily. 03/01/08  Yes [provider]  Biotin 5000  MCG CAPS Take 5,000 mcg by mouth daily.   Yes [provider]  Cholecalciferol (VITAMIN D3) 50 MCG (2000 UT) TABS Take 2,000 Units by mouth daily.   Yes [provider]  Cyanocobalamin (VITAMIN B-12) 5000 MCG SUBL Take 5,000 mcg by mouth daily.   Yes [provider]  docusate sodium (COLACE) 100 MG capsule Take 100 mg by mouth 2 (two) times daily.   Yes [provider]  HYDROcodone-acetaminophen (NORCO/VICODIN) 5-325 MG tablet Take 1 tablet by mouth every 6 (six) hours as needed for moderate pain. 07/27/20 07/27/21 Yes Irine Seal, MD  linaclotide [facility]) 145 MCG CAPS capsule Take 145 mcg by mouth daily before breakfast.   Yes [provider]  Multiple Vitamin (MULTIVITAMIN WITH MINERALS) TABS tablet Take 1 tablet by mouth daily.   Yes [provider]  ondansetron (ZOFRAN) 4 MG tablet Take 4 mg by mouth every 8 (eight) hours as needed for nausea or vomiting.   Yes [provider]  polyethylene glycol (MIRALAX / GLYCOLAX) 17 g packet Take 17 g by mouth daily.   Yes [provider]  SYMBICORT 80-4.5 MCG/ACT inhaler Inhale 2 puffs into the lungs 2 (two) times daily. Rinse mouth after use 01/04/21  Yes [provider]  lisinopril (ZESTRIL) 2.5 MG tablet Take 2.5 mg by mouth every evening.  Patient not taking: Reported on 01/08/2021    [provider]  naproxen sodium (ALEVE) 220 MG tablet Take 220-440 mg by mouth daily as  needed (pain). Patient not taking: No sig reported    [provider]  rosuvastatin (CRESTOR) 10 MG tablet Take 5 mg by mouth every evening. Patient not taking: Reported on 01/08/2021    [provider]     Family History  Problem Relation Age of Onset   Prostate cancer Neg Hx    Breast cancer Neg Hx    Colon cancer Neg Hx    Pancreatic cancer Neg Hx     Social History   Socioeconomic History   Marital status: Married    Spouse name: Not on file   Number of children: 1    Years of education: Not on file   Highest education level: Not on file  Occupational History   Not on file  Tobacco Use   Smoking status: Former    Packs/day: 0.50    Years: 12.00    Pack years: 6.00    Types: Cigarettes    Quit date: 1980    Years since quitting: 42.7   Smokeless tobacco: Never  Vaping Use   Vaping Use: Never used  Substance and Sexual Activity   Alcohol use: Not Currently   Drug use: Never   Sexual activity: Not Currently    Comment: erectile dysfunction  Other Topics Concern   Not on file  Social History Narrative   Not on file   Social Determinants of Health   Financial Resource Strain: Not on file  Food Insecurity: Not on file  Transportation Needs: Not on file  Physical Activity: Not on file  Stress: Not on file  Social Connections: Not on file    Review of Systems: A 12 point ROS discussed and pertinent positives are indicated in the HPI above.  All other systems are negative.  Review of Systems  Constitutional:  Positive for activity change, appetite change and fatigue.  Respiratory:  Negative for apnea, chest tightness, wheezing and stridor.   Cardiovascular:  Negative for chest pain.  Gastrointestinal:  Positive for abdominal pain and constipation.  Skin:        pale  Neurological:  Positive for speech difficulty and weakness.  Psychiatric/Behavioral:  Negative for behavioral problems and confusion.    Vital Signs: BP (!) 157/73 (BP Location: Left Arm)   Pulse 73   Temp (!) 97.3 F (36.3 C) (Oral)   Resp 16   SpO2 99%   Physical Exam Vitals and nursing note reviewed.  HENT:     Head: Normocephalic.     Mouth/Throat:     Mouth: Mucous membranes are moist.  Cardiovascular:     Rate and Rhythm: Normal rate and regular rhythm.  Pulmonary:     Effort: Pulmonary effort is normal.     Comments: Mildly distant breath sounds of lower right lung Abdominal:     General: Abdomen is flat.  Skin:    General: Skin is dry.      Coloration: Skin is pale.  Neurological:     Mental Status: He is alert and oriented to person, place, and time.  Psychiatric:        Mood and Affect: Mood normal.        Behavior: Behavior normal.        Thought Content: Thought content normal.        Judgment: Judgment normal.    Imaging: CT Angio Chest PE W and/or Wo Contrast  Result Date: 01/08/2021 CLINICAL DATA:  Chest pain.  High probability for PE. EXAM: CT ANGIOGRAPHY CHEST WITH CONTRAST TECHNIQUE:  Multidetector CT imaging of the chest was performed using the standard protocol during bolus administration of intravenous contrast. Multiplanar CT image reconstructions and MIPs were obtained to evaluate the vascular anatomy. CONTRAST:  136mL OMNIPAQUE IOHEXOL 350 MG/ML SOLN COMPARISON:  Prior CT scan of the chest 04/05/2020 FINDINGS: Cardiovascular: Satisfactory opacification of the pulmonary arteries to the segmental level. No evidence of pulmonary embolism. Normal heart size. No pericardial effusion. Fusiform aneurysmal dilation of the ascending thoracic aorta with a maximal transverse diameter of 4.5 cm. Scattered atherosclerotic calcifications throughout the aorta and the native coronary arteries. Mediastinum/Nodes: Unremarkable thyroid gland. High right paratracheal lymph node measures 1.4 cm in short axis (image 22 series 4). Left paratracheal lymph node measures 1.5 cm in short axis (image 34 series 4). Subcarinal lymphadenopathy measures approximately 2.2 cm in short axis (image 42 series 4). Posterior left mediastinal mass measures a proximally 4.5 x 2.8 x 3.8 cm indirectly invades the head and neck of the left third rib. The mass also extends into the neural foramen. Lungs/Pleura: Approximally 9.0 x 8.0 x 8.7 cm ill-defined heterogeneous mass centered in the posteromedial aspect of the right lower lobe contains numerous punctate internal calcifications. The mass also appears to extend across the diaphragm and directly into segment 7 of the  liver. There is a small and likely malignant associated right pleural effusion. 0.6 cm right middle lobe pulmonary nodule (image 73 series 6) is indeterminate. 2 mm nodule more superiorly on image 67 also noted. Peripheral subpleural reticulation, architectural distortion and honeycombing in the left lung base suggests underlying pulmonary fibrosis. Upper Abdomen: No acute abnormality. Musculoskeletal: Soft tissue lytic lesion destroying the head and neck of the left third rib with extension into the neural foramen as described above. No additional foci of osseous metastatic disease identified. Review of the MIP images confirms the above findings. IMPRESSION: 1. Approximally 9 cm ill-defined heterogeneous right lower lobe mass appears to cross the diaphragm and directly invade segment 7 of the liver. Findings are highly concerning for advanced primary bronchogenic carcinoma. 2. Lytic soft tissue metastasis in the left posterior mediastinum centered on the head and neck of the left third rib. The mass encroaches into the left T3-T4 neural foramen. 3. Presumably metastatic mediastinal lymphadenopathy. 4. Small right-sided pleural effusion is likely malignant. 5. Nonspecific right middle lobe pulmonary nodules measuring 0.6 and 0.2 cm. Small pulmonary metastases versus incidental benign nodules. 6. 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 7. 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 Ascending thoracic aortic aneurysm. Recommend semi-annual imaging followup by CTA or MRA and referral to cardiothoracic surgery if not already obtained. This recommendation follows 2010 ACCF/AHA/AATS/ACR/ASA/SCA/SCAI/SIR/STS/SVM Guidelines for the Diagnosis and Management of Patients With Thoracic Aortic Disease. Circulation. 2010; 121: G017-C944. Aortic aneurysm NOS (ICD10-I71.9); Aortic Atherosclerosis (ICD10-I70.0). 8. Coronary artery calcifications. Electronically Signed   By: Jacqulynn Cadet M.D.   On: 01/08/2021 17:09   CT ABDOMEN PELVIS W CONTRAST  Result Date: 01/08/2021 CLINICAL DATA:  Left lower quadrant pain EXAM: CT ABDOMEN AND PELVIS WITH CONTRAST TECHNIQUE: Multidetector CT imaging of the abdomen and pelvis was performed using the standard protocol following bolus administration of intravenous contrast. CONTRAST:  165mL OMNIPAQUE IOHEXOL 350 MG/ML SOLN COMPARISON:  CT 01/19/2020, chest CT 01/08/2021, FINDINGS: Lower chest: Mild aneurysmal dilatation of the ascending aorta measuring up to 4.4 cm, heavily calcified. Extensive coronary vascular calcification. Normal cardiac size. Trace pericardial effusion. Incompletely visualized necrotic appearing subcarinal lymph node measuring up to 2.6 cm.  Large necrotic appearing mass in the medial right base with punctate calcifications, this measures approximately 9.6 by 7.8 cm and extends across the right diaphragm, into the adjacent liver. Thick rimmed hypodense enhancing lesion in the right hepatic lobe measuring 3.8 cm. Small right-sided pleural effusion Hepatobiliary: No calcified gallstone. No biliary dilatation. Heterogenous enhancement of right hepatic lobe adjacent to mass lesion. Pancreas: Unremarkable. No pancreatic ductal dilatation or surrounding inflammatory changes. Spleen: Indeterminate hypoenhancing mass within the spleen measuring 2.4 cm, series 2, image 34. Adrenals/Urinary Tract: Adrenal glands are normal. Punctate nonobstructing stones within the bilateral kidneys. Scattered renal cysts. Interim finding of 14 mm indeterminate right renal lesion at the midpole, series 2, image 52. Interim finding of indeterminate 13 mm exophytic lesion mid pole left kidney, series 2, image 44. Slightly thick-walled urinary bladder. 4 mm stone within the right posterior bladder. Stomach/Bowel: The stomach is nonenlarged. No dilated small bowel. No acute bowel wall thickening. Moderate to large stool in the rectosigmoid colon with moderate  formed feces at the rectum. Negative appendix Vascular/Lymphatic: Advanced aortic atherosclerosis. No aneurysm. No suspicious nodes. Reproductive: Post treatment changes of the prostate gland Other: Presacral edema and soft tissue stranding.  No free air. Musculoskeletal: Lytic lesion and soft tissue mass involving the L1 vertebra right pedicle, lamina, vertebral body and transverse process. Soft tissue mass involves the paraspinal soft tissues as well. 2.5 cm hypodense lesion in the right paraspinal soft tissues posterior to right transverse process at L2, series 2, image 49. Large lytic lesion L5 vertebral body. Chronic bilateral pars defect at L5. 5.5 x 4.3 cm enhancing heterogenous soft tissue mass in the left gluteus muscles, series 2, image 93. IMPRESSION: 1. Small right-sided pleural effusion with large necrotic appearing mass in the posteromedial right lower lobe that appears to traverse the right diaphragm and extends into the adjacent liver. 2. Lytic lesion with associated soft tissue mass at L1 with additional lytic lesion at L5 vertebral body consistent with metastatic disease. 5.5 cm heterogenous enhancing soft tissue mass in the left gluteus muscles also suspect for metastatic disease. Small hypodense right paraspinal soft tissue mass posterior to transverse process of L2, concerning for metastatic disease 3. Vague hypodensity within the mid spleen is indeterminate for infarct or hypodense mass lesion. 4. Interval development of bilateral indeterminate renal lesions measuring up to 13 mm. 5. Nonobstructing kidney stones.  4 mm stone in the bladder Electronically Signed   By: Donavan Foil M.D.   On: 01/08/2021 17:39   CT L-SPINE NO CHARGE  Result Date: 01/08/2021 CLINICAL DATA:  Back pain. EXAM: CT LUMBAR SPINE WITH CONTRAST TECHNIQUE: Technique: Multiplanar CT images of the lumbar spine were reconstructed from contemporary CT of the Abdomen and Pelvis. CONTRAST:  No additional COMPARISON:  CT  abdomen and pelvis 01/19/2020 FINDINGS: Segmentation: 5 lumbar type vertebrae. Alignment: Normal. Vertebrae: Chronic bilateral L5 pars defects. 5 cm destructive mass involving the right-sided posterior elements and posterior vertebral body of L1 mildly encroaching upon the right L1-2 neural foramen. 3.7 cm lytic lesion in the left aspect of the L5 vertebral body with focal disruption of the posterior vertebral body cortex and likely small volume epidural tumor in the left lateral recess. Preserved vertebral body heights. Paraspinal and other soft tissues: 2.5 cm hypodense mass in the paraspinal soft tissues posterior to the right L2 transverse process. Intra-abdominal and pelvic contents reported separately. Disc levels: Mild lumbar spondylosis and facet arthrosis without high-grade spinal stenosis. Mild-to-moderate multilevel neural foraminal stenosis. IMPRESSION: 1. Destructive bone  lesions at L1 and L5 consistent with metastatic disease. Suspected small volume epidural tumor in the left lateral recess at L5. 2. 2.5 cm paraspinal soft tissue mass posterior to the right L2 transverse process also concerning for metastatic disease. Electronically Signed   By: Logan Bores M.D.   On: 01/08/2021 18:16    Labs:  CBC: Recent Labs    01/08/21 1532 01/09/21 0522 01/09/21 1850 01/10/21 0126  WBC 22.9* 21.3* 23.3* 19.0*  HGB 11.8* 10.7* 10.5* 9.3*  HCT 38.0* 35.6* 34.6* 29.8*  PLT 563* 535* 529* 407*    COAGS: Recent Labs    07/25/20 0921 01/10/21 0804  INR 1.1 1.5*  APTT 37*  --     BMP: Recent Labs    07/25/20 0921 01/08/21 1532 01/09/21 0522 01/10/21 0126  NA 139 136 143 137  K 4.5 3.7 3.6 3.3*  CL 106 101 107 105  CO2 25 28 28 26   GLUCOSE 107* 93 85 81  BUN 23 28* 24* 17  CALCIUM 10.1 10.7* 10.6* 10.4*  CREATININE 0.90 0.73 0.68 0.70  GFRNONAA >60 >60 >60 >60    LIVER FUNCTION TESTS: Recent Labs    07/25/20 0921 01/08/21 1532 01/09/21 0522 01/10/21 0126  BILITOT 0.8  1.3* 1.0 1.2  AST 18 19 15  14*  ALT 22 22 17 15   ALKPHOS 93 175* 157* 124  PROT 7.6 5.7* 5.3* 4.7*  ALBUMIN 3.9 2.2* 2.0* 1.6*     Assessment and Plan:  Right lower lobe lung lesion.  OK to proceed with aspiration/biopsy procedure.    Risks and benefits of CT guided lung nodule biopsy was discussed with the patient including, but not limited to bleeding, hemoptysis, respiratory failure requiring intubation, infection, pneumothorax requiring chest tube placement, stroke from air embolism or even death.  All of the patient's questions were answered and the patient is agreeable to proceed.  Consent signed by daughter and placed in chart.   Thank you for this interesting consult.  I greatly enjoyed meeting Cody Carr and look forward to participating in their care.  A copy of this report was sent to the requesting provider on this date.   Electronically Signed: Pasty Spillers, PA 01/10/2021, 9:05 AM   I spent a total of 40 Minutes in face to face in clinical consultation, greater than 50% of which was counseling/coordinating care for right lung nodule biopsy with possible chest tube insertion.

## 2021-01-10 NOTE — Progress Notes (Signed)
Patient brought back to 4E from IR. VSS. Telemetry box applied. 2L O2 applied.

## 2021-01-10 NOTE — Progress Notes (Signed)
Patients daughter would like to speak to MD about patients pain and his pain levels / control. She is wanting to understand why his levels are higher than they were. MD notified.   Daymon Larsen, RN

## 2021-01-10 NOTE — Hospital Course (Signed)
75 y.o. male, with history of nephrolithiasis, hyperlipidemia, hypertension, prostate cancer with seeds, throat cancer status post chemoradiation per patient, and more presented the [facility] ED with a chief complaint of back pain, constipation, and poor p.o. intake.  CT showed right-sided pleural effusion, large necrotic appearing mass in the posterior right lower lobe appearing to cross the right diaphragm extending into adjacent liver.  Imaging also revealed mediastinal lymphadenopathy, lytic lesions in the lumbar spine concerning for metastatic disease enhancing soft tissues mass in the left gluteal muscle, bilateral renal lesions.  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  Patient was seen by oncology.  Transferred to [facility] for IR to obtain tissue for biopsy.

## 2021-01-10 NOTE — Progress Notes (Signed)
Pts daughter states pt was on a potassium drip at prior hospital [facility]. Current potassium 3.3. Daughter wants to know if he needs the drip or potassium in general. MD notified.  Daymon Larsen, RN

## 2021-01-11 ENCOUNTER — Inpatient Hospital Stay (HOSPITAL_COMMUNITY): Payer: Medicare PPO

## 2021-01-11 DIAGNOSIS — J189 Pneumonia, unspecified organism: Secondary | ICD-10-CM | POA: Diagnosis not present

## 2021-01-11 DIAGNOSIS — E440 Moderate protein-calorie malnutrition: Secondary | ICD-10-CM | POA: Diagnosis not present

## 2021-01-11 DIAGNOSIS — C7951 Secondary malignant neoplasm of bone: Secondary | ICD-10-CM | POA: Diagnosis not present

## 2021-01-11 DIAGNOSIS — J910 Malignant pleural effusion: Secondary | ICD-10-CM | POA: Diagnosis not present

## 2021-01-11 DIAGNOSIS — K5903 Drug induced constipation: Secondary | ICD-10-CM | POA: Diagnosis not present

## 2021-01-11 DIAGNOSIS — J9601 Acute respiratory failure with hypoxia: Secondary | ICD-10-CM | POA: Diagnosis not present

## 2021-01-11 LAB — COMPREHENSIVE METABOLIC PANEL
ALT: 15 U/L (ref 0–44)
AST: 13 U/L — ABNORMAL LOW (ref 15–41)
Albumin: 1.6 g/dL — ABNORMAL LOW (ref 3.5–5.0)
Alkaline Phosphatase: 118 U/L (ref 38–126)
Anion gap: 11 (ref 5–15)
BUN: 15 mg/dL (ref 8–23)
CO2: 25 mmol/L (ref 22–32)
Calcium: 10.1 mg/dL (ref 8.9–10.3)
Chloride: 104 mmol/L (ref 98–111)
Creatinine, Ser: 0.71 mg/dL (ref 0.61–1.24)
GFR, Estimated: >60 mL/min (ref >60)
Glucose, Bld: 74 mg/dL (ref 70–99)
Potassium: 3.1 mmol/L — ABNORMAL LOW (ref 3.5–5.1)
Sodium: 140 mmol/L (ref 135–145)
Total Bilirubin: 1.5 mg/dL — ABNORMAL HIGH (ref 0.3–1.2)
Total Protein: 4.7 g/dL — ABNORMAL LOW (ref 6.5–8.1)

## 2021-01-11 LAB — CBC
HCT: 30.1 % — ABNORMAL LOW (ref 39.0–52.0)
Hemoglobin: 9.2 g/dL — ABNORMAL LOW (ref 13.0–17.0)
MCH: 27.1 pg (ref 26.0–34.0)
MCHC: 30.6 g/dL (ref 30.0–36.0)
MCV: 88.5 fL (ref 80.0–100.0)
Platelets: 423 10*3/uL — ABNORMAL HIGH (ref 150–400)
RBC: 3.4 MIL/uL — ABNORMAL LOW (ref 4.22–5.81)
RDW: 15.9 % — ABNORMAL HIGH (ref 11.5–15.5)
WBC: 21 10*3/uL — ABNORMAL HIGH (ref 4.0–10.5)
nRBC: 0 % (ref 0.0–0.2)

## 2021-01-11 LAB — MAGNESIUM: Magnesium: 1.9 mg/dL (ref 1.7–2.4)

## 2021-01-11 IMAGING — MR MR THORACIC SPINE WO/W CM
9 of 12 series · 28 of 48 positions shown · IV contrast (gadavist)
Comparison: Prior CTs from [DATE].

CLINICAL DATA: Initial evaluation for metastatic workup.

EXAM:
MRI THORACIC WITHOUT AND WITH CONTRAST
TECHNIQUE: Multiplanar and multiecho pulse sequences of the thoracic spine were
obtained without and with intravenous contrast.
CONTRAST:  8mL GADAVIST GADOBUTROL 1 MMOL/ML IV SOLN

[Series 12: T1 · sagittal · 5.0mm · 1.46mm/px · 2 of 9 slices shown (1 of 5)]
[im 1/9]
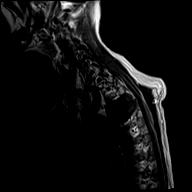
[im 9/9]
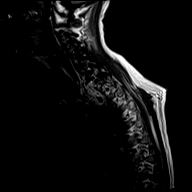

[Series 13: T1 · sagittal · 5.0mm · 1.23mm/px · 2 of 9 slices shown (2 of 5)]
[im 1/9]
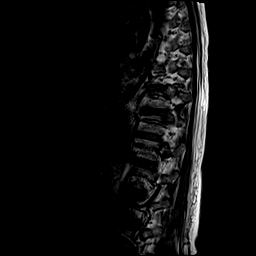
[im 9/9]
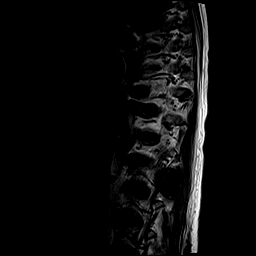

[Series 14: T1 · sagittal · 6.0mm · 1.23mm/px · 1 of 9 slices shown (3 of 5)]
[im 1/9]
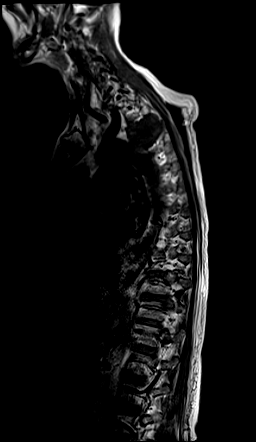

[Series 15: T2 · sagittal · 3.0mm · 0.88mm/px · 3 of 21 slices shown (1 of 2)]
[im 1/21]
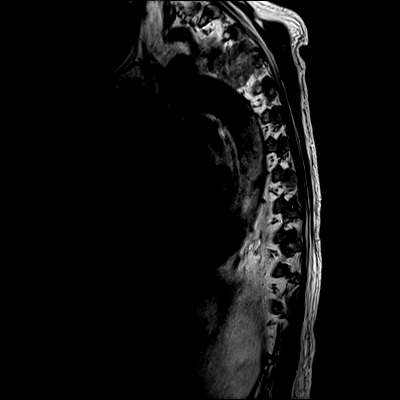
[im 11/21]
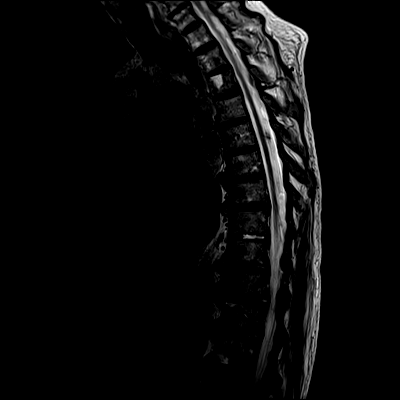
[im 21/21]
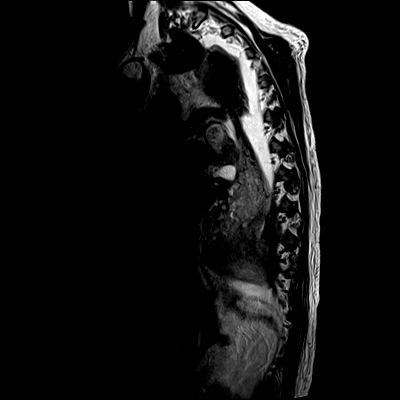

[Series 16: T1 · sagittal · 3.0mm · 0.91mm/px · 3 of 21 slices shown (4 of 5)]
[im 1/21]
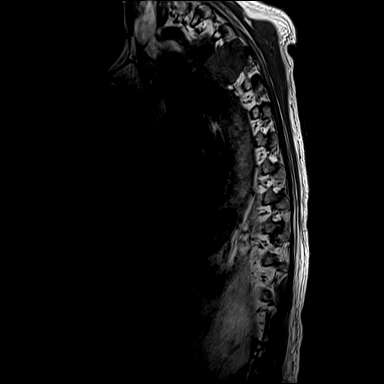
[im 11/21]
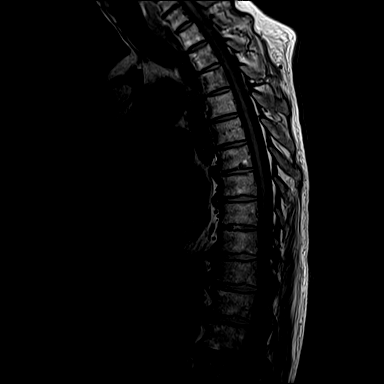
[im 21/21]
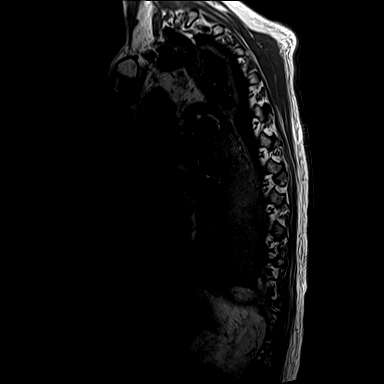

[Series 18: T2 · axial · 4.0mm · 0.59mm/px · z∈[-220,+15]mm · 6 of 39 slices shown (2 of 2)]
[im 1/39]
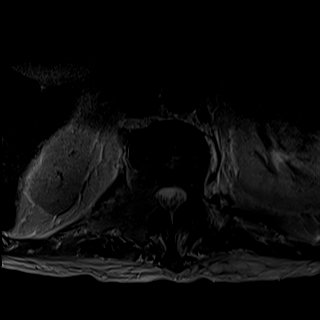
[im 8/39]
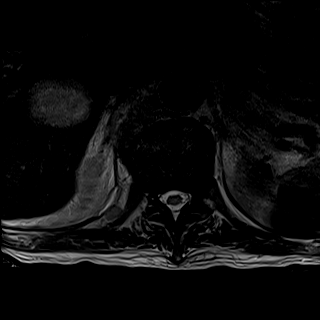
[im 16/39]
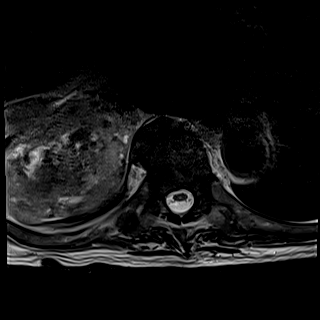
[im 23/39]
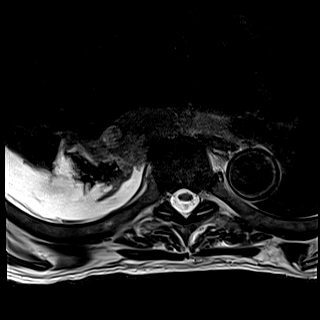
[im 31/39]
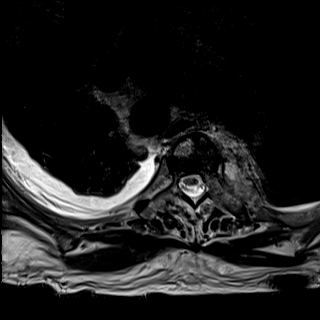
[im 39/39]
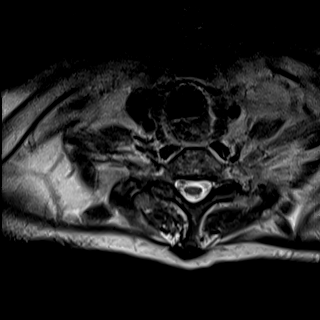

[Series 20: T1 · axial · non-contrast · 4.0mm · 0.35mm/px · z∈[-218,+14]mm · 6 of 39 slices shown (5 of 5)]
[im 1/39]
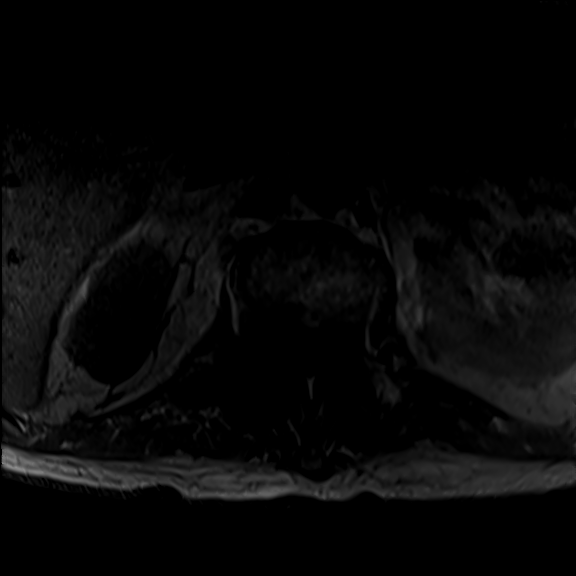
[im 8/39]
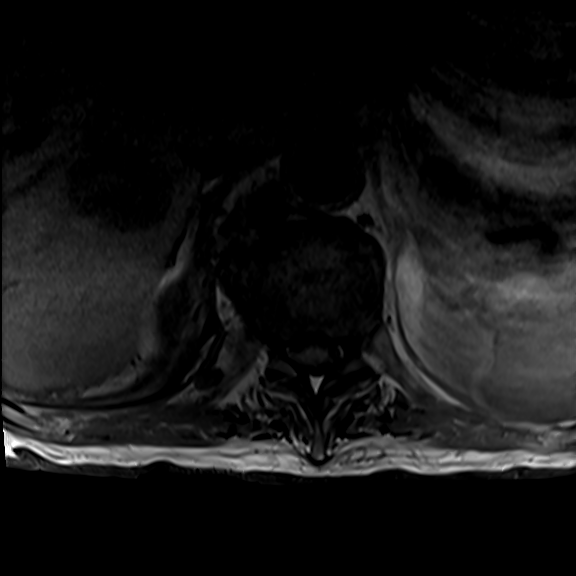
[im 16/39]
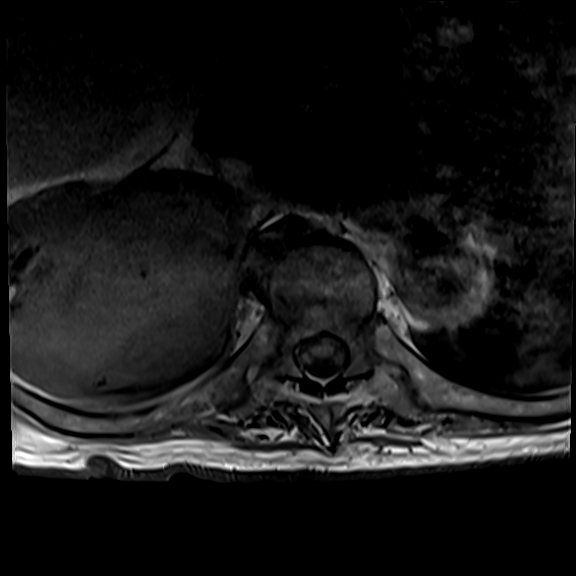
[im 23/39]
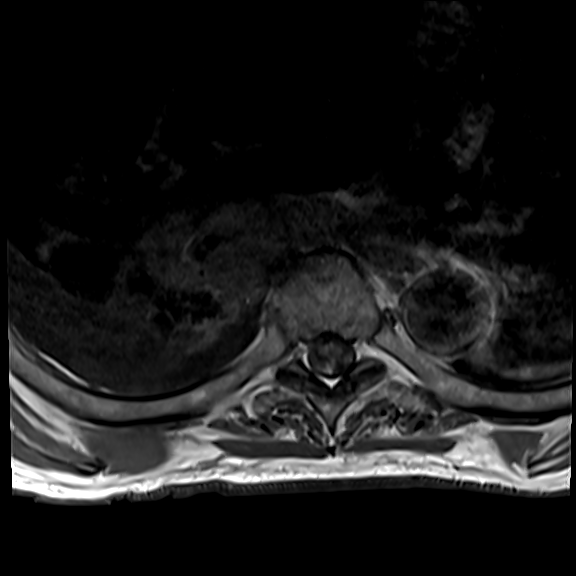
[im 31/39]
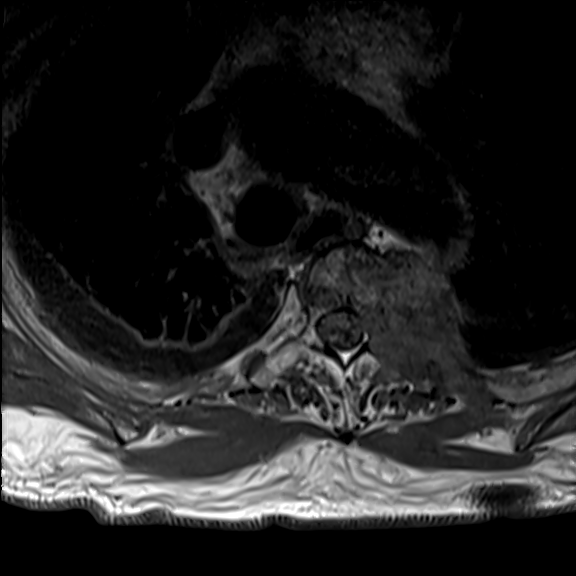
[im 39/39]
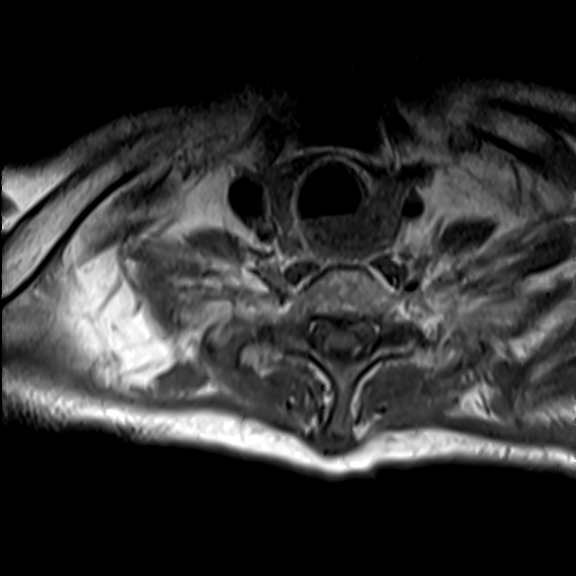

[Series 22: T1 fat-sat post-contrast · sagittal · 3.0mm · 0.91mm/px · 3 of 21 slices shown (1 of 2)]
[im 1/21]
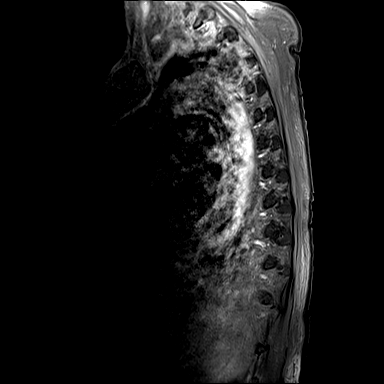
[im 11/21]
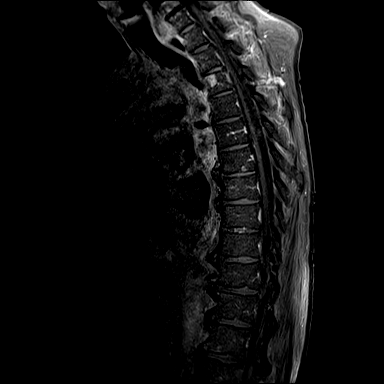
[im 21/21]
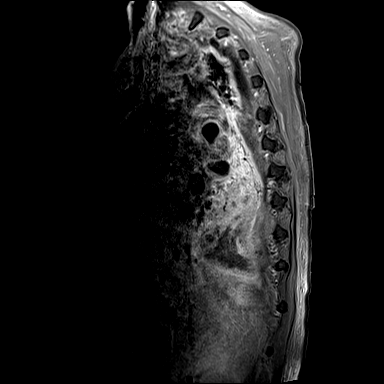

[Series 23: T1 fat-sat post-contrast · coronal · 3.0mm · 1.05mm/px · 2 of 46 slices shown (2 of 2)]
[im 1/46]
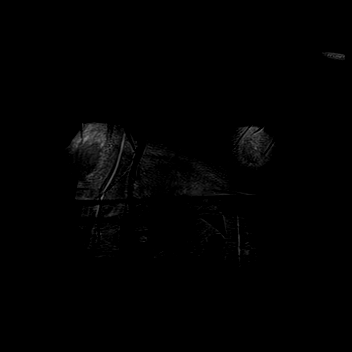
[im 8/46]
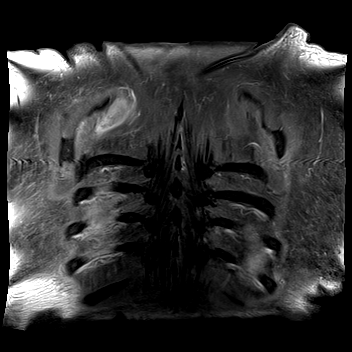

[28 of 48 positions shown; findings below may reference images not displayed]

FINDINGS: Alignment: Physiologic with preservation of the normal thoracic
kyphosis. No listhesis.

Vertebrae: Large metastatic implant involving the left aspect of the
T1 and T2 vertebral bodies is seen. Lesion measures approximately
5.1 x 3.2 x 3.7 cm (series 19, image 8). Involvement of the adjacent
left posterior T2 and T3 ribs noted. Invasion of the adjacent left
T2-3 and T3-4 neural foramina. The left T2-3 neural foramen is
largely obliterated, with moderate narrowing at the left T3-4 neural
foramen. Mild flattening of the left aspect of the adjacent thecal
sac without significant stenosis or additional epidural invasion at
this time. No associated pathologic fracture.

Additional 1.5 cm osseous metastasis seen at the right anterior
aspect of T7 (series 22, image 9).

Large metastatic implant involving the right posterior L1 vertebral
body as well as the adjacent right posterior elements partially
visualized (series 22, image 9). This lesion abuts the right aspect
of the thecal sac at the level of T12-L1 (series 18, image 39).

No other significant metastatic disease seen elsewhere within the
thoracic spine at this time. Vertebral body height maintained.
Underlying bone marrow signal intensity diffusely heterogeneous. No
evidence for osteomyelitis discitis or septic arthritis.

Cord: Normal signal and morphology. Other than the above described
lesions at T2-3/T3-4 and L1 abutting the thecal sac, no other
discernible epidural involvement. No other abnormal enhancement.

Paraspinal and other soft tissues: Large necrotic mass positioned at
the posterior right lung base again seen. Direct extension through
the right hemidiaphragm into the adjacent liver noted. Associated
irregular right pleural effusion. Right hilar and subcarinal
adenopathy. Additional trace left pleural effusion. Findings better
evaluated on prior CT. Probable cholelithiasis noted as well.

Disc levels:

Tiny right paracentral disc protrusion at T7-8 minimally flattens
the right ventral thecal sac without significant stenosis.
Otherwise, no other significant disc pathology seen within the
thoracic spine. No spinal stenosis. Mild bilateral foraminal
narrowing noted at T10-11 due to facet disease.
IMPRESSION: 1. Large metastatic implant involving the left aspect of the T1 and
T2 vertebral bodies with invasion of the adjacent left T2-3 and T3-4
neural foramina.
2. Additional 1.5 cm metastatic implant involving the right anterior
aspect of T7.
3. Large metastatic implant involving the right posterior L1
vertebral body and adjacent posterior elements, partially
visualized.
4. No other significant metastatic involvement within the thoracic
spine at this time. No other epidural involvement. No pathologic
fracture.
5. Large necrotic mass at the posterior right lung base with
invasion of the adjacent right hemidiaphragm and liver, with
associated right pleural effusion and mediastinal adenopathy.
Findings better evaluated on prior CT.

## 2021-01-11 IMAGING — MR MR HEAD WO/W CM
14 of 20 series · 33 of 48 positions shown · IV contrast (gadavist)
Comparison: None available.

CLINICAL DATA: Initial evaluation for metastatic disease.

EXAM:
MRI HEAD WITHOUT AND WITH CONTRAST
TECHNIQUE: Multiplanar, multiecho pulse sequences of the brain and surrounding
structures were obtained without and with intravenous contrast.
CONTRAST:  8mL GADAVIST GADOBUTROL 1 MMOL/ML IV SOLN

[Series 5: DWI · axial · 3.0mm · 0.88mm/px · z∈[-130,+19]mm · 5 of 104 slices shown (1 of 4)]
[im 1/104]
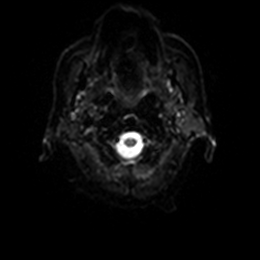
[im 26/104]
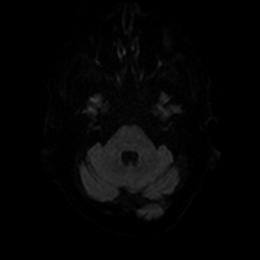
[im 52/104]
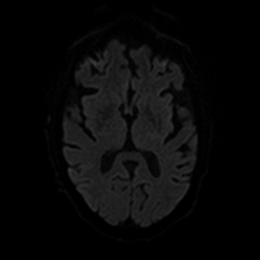
[im 78/104]
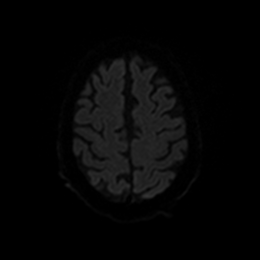
[im 104/104]
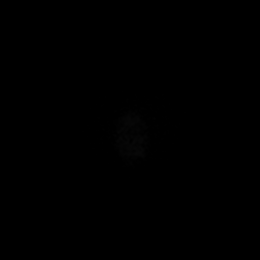

[Series 6: DWI · axial · 3.0mm · 0.88mm/px · z∈[-130,+19]mm · 2 of 51 slices shown (2 of 4)]
[im 1/51]
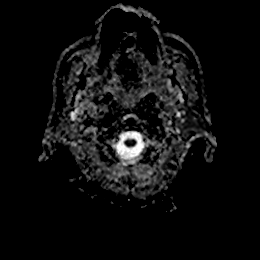
[im 51/51]
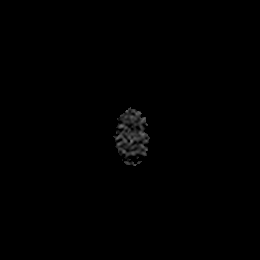

[Series 7: DWI · coronal · 4.0mm · 0.88mm/px · 4 of 70 slices shown (3 of 4)]
[im 1/70]
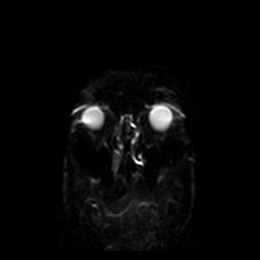
[im 24/70]
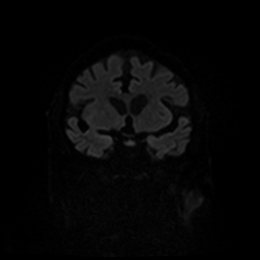
[im 47/70]
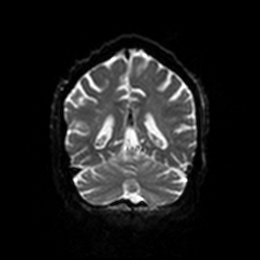
[im 70/70]
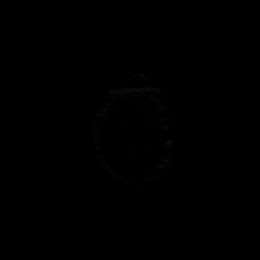

[Series 8: DWI · coronal · 4.0mm · 0.88mm/px · 2 of 35 slices shown (4 of 4)]
[im 1/35]
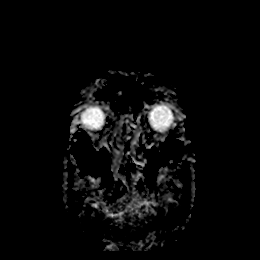
[im 35/35]
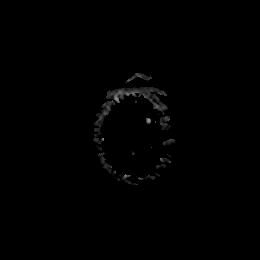

[Series 9: T1 · sagittal · 5.0mm · 0.75mm/px · 1 of 23 slices shown]
[im 1/23]
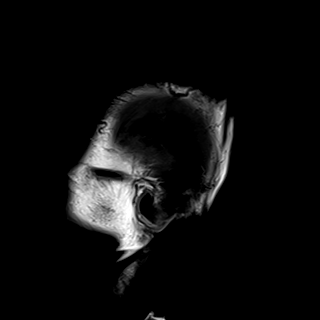

[Series 10: T2 · axial · 5.0mm · 0.72mm/px · 1 of 27 slices shown]
[im 1/27]
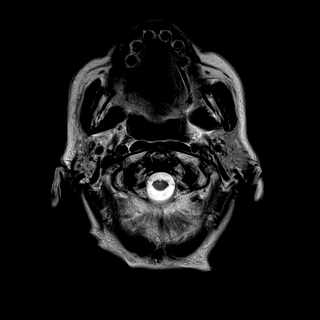

[Series 11: FLAIR · axial · 5.0mm · 0.45mm/px · 1 of 27 slices shown]
[im 1/27]
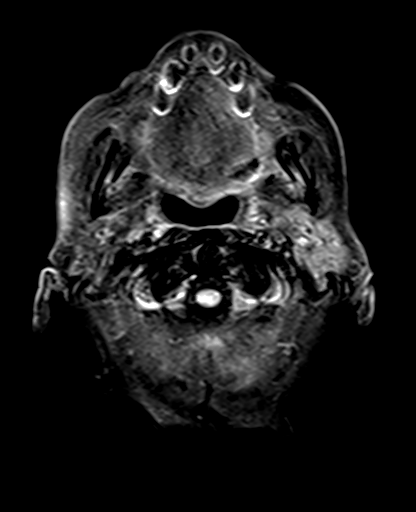

[Series 12: mag_images · axial · 3.0mm · 0.90mm/px · z∈[-147,+25]mm · 3 of 60 slices shown]
[im 1/60]
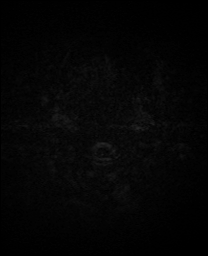
[im 30/60]
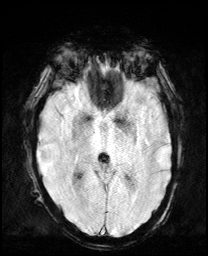
[im 60/60]
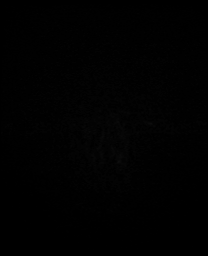

[Series 13: pha_images · axial · 3.0mm · 0.90mm/px · z∈[-145,+16]mm · 3 of 55 slices shown]
[im 1/55]
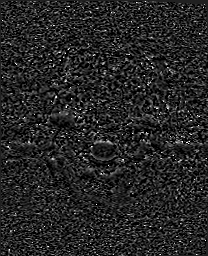
[im 28/55]
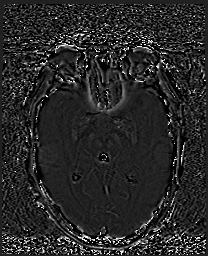
[im 55/55]
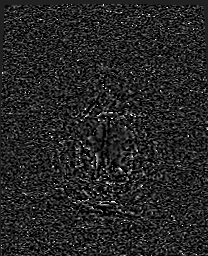

[Series 14: swi_images · axial · 3.0mm · 0.90mm/px · z∈[-147,+25]mm · 3 of 60 slices shown]
[im 1/60]
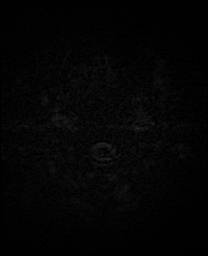
[im 30/60]
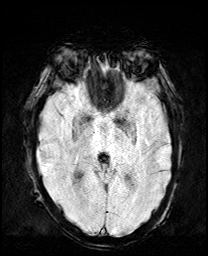
[im 60/60]
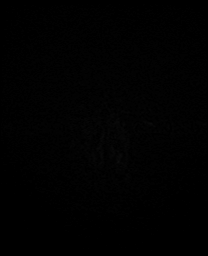

[Series 15: mip_images(sw) · axial · 24.0mm · 0.90mm/px · z∈[-137,+15]mm · 3 of 53 slices shown]
[im 1/53]
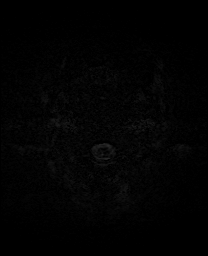
[im 27/53]
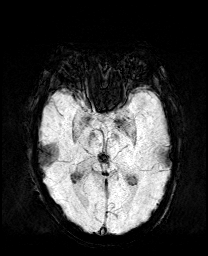
[im 53/53]
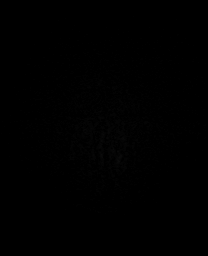

[Series 17: T2 post-contrast · coronal · 5.0mm · 0.72mm/px · 2 of 30 slices shown]
[im 1/30]
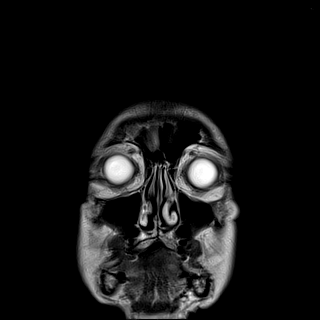
[im 30/30]
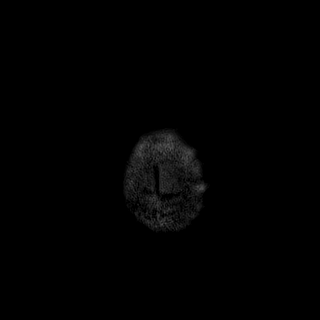

[Series 19: T1 post-contrast · coronal · 5.0mm · 0.34mm/px · 2 of 30 slices shown (1 of 2)]
[im 1/30]
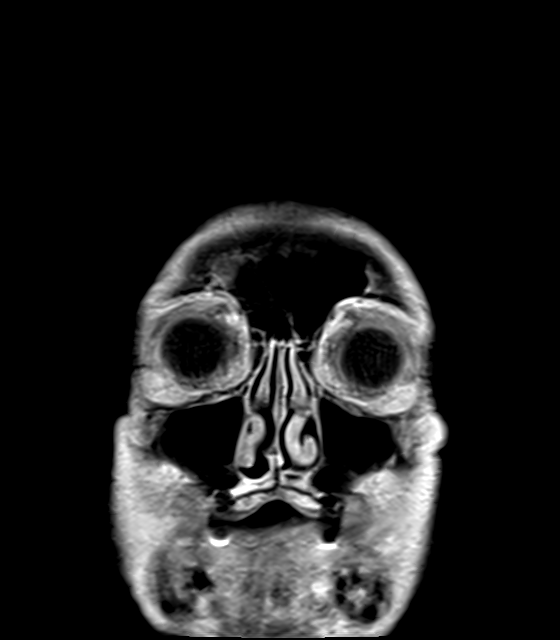
[im 30/30]
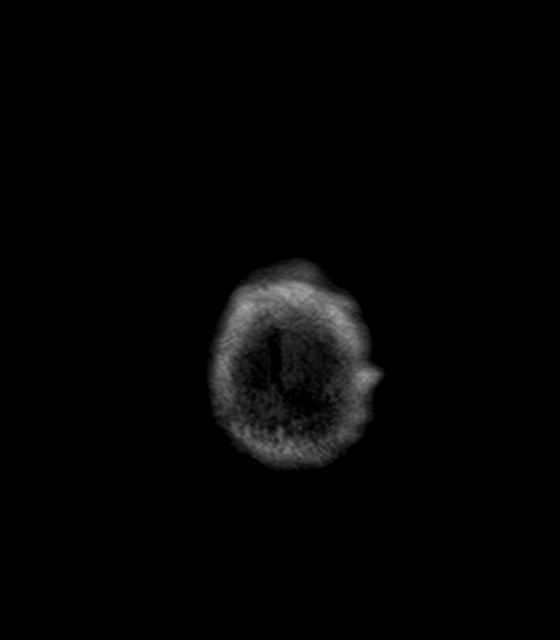

[Series 20: T1 post-contrast · sagittal · 5.0mm · 0.72mm/px · 1 of 23 slices shown (2 of 2)]
[im 1/23]
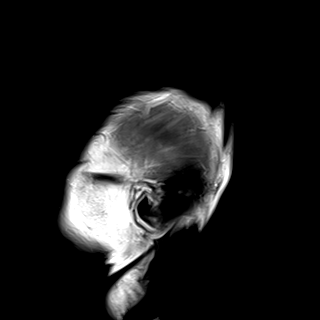

[33 of 48 positions shown; findings below may reference images not displayed]

FINDINGS: Brain: Examination mildly degraded by motion artifact.

Generalized age-related cerebral atrophy. Patchy T2/FLAIR
hyperintensity within the periventricular deep white matter both
cerebral hemispheres most consistent with chronic small vessel
ischemic disease, mild to moderate in nature.

No abnormal foci of restricted diffusion to suggest acute or
subacute ischemia. Gray-white matter differentiation maintained. No
encephalomalacia to suggest chronic cortical infarction. No evidence
for acute or chronic intracranial hemorrhage.

No mass lesion, midline shift or mass effect. No hydrocephalus or
extra-axial fluid collection. Pituitary gland suprasellar region
normal. Midline structures intact.

No abnormal enhancement. No evidence for intracranial metastatic
disease.

Vascular: Major intracranial vascular flow voids are maintained.

Skull and upper cervical spine: Craniocervical junction within
normal limits. Bone marrow signal intensity normal. No visible focal
marrow replacing lesion. No scalp soft tissue abnormality.

Sinuses/Orbits: Patient status post bilateral ocular lens
replacement. Globes and orbital soft tissues demonstrate no other
acute finding. Paranasal sinuses are largely clear. Small left
mastoid effusion, of doubtful significance. Inner ear structures
grossly normal.

Other: None.
IMPRESSION: 1. No evidence for intracranial metastatic disease.
2. Age-related cerebral atrophy with mild to moderate chronic
microvascular ischemic disease.

## 2021-01-11 MED ORDER — SODIUM CHLORIDE 0.9 % IV SOLN: INTRAVENOUS | Status: DC

## 2021-01-11 MED ORDER — ORAL CARE MOUTH RINSE
15.0000 mL | Freq: Two times a day (BID) | OROMUCOSAL | Status: DC
  Administered 2021-01-11 – 2021-01-24 (×20): 15 mL via OROMUCOSAL

## 2021-01-11 MED ORDER — POTASSIUM CHLORIDE 20 MEQ PO PACK
40.0000 meq | PACK | ORAL | Status: DC
Start: 2021-01-11 — End: 2021-01-11
  Administered 2021-01-11: 40 meq via ORAL
  Filled 2021-01-11: qty 2

## 2021-01-11 MED ORDER — BOOST PLUS PO LIQD
237.0000 mL | Freq: Three times a day (TID) | ORAL | Status: DC
  Administered 2021-01-11 – 2021-01-13 (×3): 237 mL via ORAL
  Filled 2021-01-11 (×10): qty 237

## 2021-01-11 MED ORDER — POTASSIUM CHLORIDE 10 MEQ/100ML IV SOLN
10.0000 meq | INTRAVENOUS | Status: AC
Start: 2021-01-11 — End: 2021-01-12
  Administered 2021-01-11 (×2): 10 meq via INTRAVENOUS
  Filled 2021-01-11: qty 100

## 2021-01-11 MED ORDER — MORPHINE SULFATE (PF) 4 MG/ML IV SOLN
4.0000 mg | INTRAVENOUS | Status: DC | PRN
Start: 2021-01-11 — End: 2021-01-15
  Administered 2021-01-11 – 2021-01-14 (×12): 4 mg via INTRAVENOUS
  Filled 2021-01-11 (×12): qty 1

## 2021-01-11 MED ORDER — METHOCARBAMOL 1000 MG/10ML IJ SOLN
500.0000 mg | Freq: Three times a day (TID) | INTRAVENOUS | Status: DC | PRN
  Filled 2021-01-11 (×2): qty 5

## 2021-01-11 MED ORDER — ADULT MULTIVITAMIN W/MINERALS CH
1.0000 | ORAL_TABLET | Freq: Every day | ORAL | Status: DC
  Administered 2021-01-11 – 2021-01-13 (×3): 1 via ORAL
  Filled 2021-01-11 (×4): qty 1

## 2021-01-11 MED ORDER — MORPHINE SULFATE 10 MG/5ML PO SOLN
5.0000 mg | ORAL | Status: DC | PRN
Start: 2021-01-11 — End: 2021-01-12
  Administered 2021-01-11 – 2021-01-12 (×4): 5 mg via ORAL
  Filled 2021-01-11 (×4): qty 4

## 2021-01-11 MED ORDER — GADOBUTROL 1 MMOL/ML IV SOLN
8.0000 mL | Freq: Once | INTRAVENOUS | Status: AC | PRN
  Administered 2021-01-11: 8 mL via INTRAVENOUS

## 2021-01-11 MED ORDER — POTASSIUM CHLORIDE 10 MEQ/100ML IV SOLN
10.0000 meq | INTRAVENOUS | Status: DC
  Administered 2021-01-12: 10 meq via INTRAVENOUS

## 2021-01-11 MED ORDER — POTASSIUM CHLORIDE CRYS ER 20 MEQ PO TBCR
40.0000 meq | EXTENDED_RELEASE_TABLET | ORAL | Status: AC
  Filled 2021-01-11: qty 2

## 2021-01-11 NOTE — Evaluation (Signed)
Occupational Therapy Evaluation Patient Details Name: Cody Carr MRN: [id number] DOB: [birth year] Today's Date: 01/11/2021    History of Present Illness Pt is a 75 y.o. male who presented to ED with back pain, constipation and poor PO intake. Pt with hx of progressive back pain up to 3 months ago and believed it was a mechanical or nerve injury. Pt with recent hospitalization at [facility] for constipation and dehydration. CT showed small R pleural effusion with large necrotic appearing mass in R lower lobe that extends into diaphragm and liver. 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  PMH: nephrolithiasis, hyperlipidemia, hypertension, prostate cancer with seeds, throat cancer status post chemoradiation.`   Clinical Impression   PTA, pt was completely Independent with all tasks without AD up until approx 3 months ago. Recently, pt with progressive back pain and unable to complete ADLs/mobility without assist and has been bed bound. Pt presents now to OT with deficits noted below. Pt unable to successfully sit EOB despite Total x 2 assist due to L side/back pain. Pt requires up to Max A for UB ADLs and Total A for LB ADLs bed level. Majority of session focused on pt/family collaboration and education on: skin integrity with turning schedule, completion of ADLs bed level at home, DME needs and problem-solving home setup. Pending GOC discussions, rec HHOT follow-up to maximize pt/caregiver safety with ADLs in home environment. Pt's daughter plans to provide 24/7 caregiver support along with her mother.   Recommend wheelchair and hospital bed at home with features noted below that will decrease risk of skin breakdown and maximize pt comfort.     Follow Up Recommendations  Home health OT;Supervision/Assistance - 24 hour (pending [place] discussions)    Equipment Recommendations  Wheelchair (measurements OT);Wheelchair cushion (measurements OT);Hospital bed;Other (comment) (air  mattress for hospital bed, roho cushion for highback wheelchair with elevating leg rests, non-emergency ambulance transport home)    Recommendations for Other Services       Precautions / Restrictions Precautions Precautions: Back;Fall Precaution Booklet Issued: No Precaution Comments: Back precautions for comfort Restrictions Weight Bearing Restrictions: No      Mobility Bed Mobility Overal bed mobility: Needs Assistance Bed Mobility: Rolling;Supine to Sit;Sit to Supine Rolling: Max assist;+2 for safety/equipment   Supine to sit: Total assist;+2 for physical assistance;+2 for safety/equipment Sit to supine: Total assist;+2 for physical assistance;+2 for safety/equipment   General bed mobility comments: Max A x 1-2 for rolling to side in attempts for log rolling. Attempted to sit EOB though pt unable to achieve due to back pain. Total A x 2 for EOB attempts and return to sidelying.    Transfers                 General transfer comment: unable    Balance                                           ADL either performed or assessed with clinical judgement   ADL Overall ADL's : Needs assistance/impaired Eating/Feeding: Bed level;Moderate assistance Eating/Feeding Details (indicate cue type and reason): RN and family assisting pt with meds, drinking from cup, etc. Pt with decreased tolerance for eating upright in bed due to back pain Grooming: Moderate assistance;Bed level   Upper Body Bathing: Maximal assistance;Bed level   Lower Body Bathing: Total assistance;Bed level   Upper Body Dressing :  Maximal assistance;Bed level   Lower Body Dressing: Total assistance;Bed level       Toileting- Clothing Manipulation and Hygiene: Total assistance;Bed level         General ADL Comments: Session focused on pt/family education on DME needs, home setup, safely completing ADLs bed level and problem solving setup of pt on main floor or second floor. Pt  greatly limited by back pain and unable to sit EOB with +2 assist today. Educated on skin integrity concerns and assisted pt in sidelying at end of session. Rec turning every 2 hours     Vision Ability to See in Adequate Light: 0 Adequate Patient Visual Report: No change from baseline Vision Assessment?: No apparent visual deficits     Perception     Praxis      Pertinent Vitals/Pain Pain Assessment: Faces Faces Pain Scale: Hurts worst Pain Location: L back/side with bed mobility attempts Pain Descriptors / Indicators: Grimacing;Guarding;Moaning;Shooting Pain Intervention(s): Monitored during session;RN gave pain meds during session;Limited activity within patient's tolerance;Repositioned     Hand Dominance Right   Extremity/Trunk Assessment Upper Extremity Assessment Upper Extremity Assessment: Generalized weakness   Lower Extremity Assessment Lower Extremity Assessment: Defer to PT evaluation       Communication Communication Communication: Other (comment) (hoarse, low volume)   Cognition Arousal/Alertness: Awake/alert Behavior During Therapy: Anxious;Flat affect;Restless Overall Cognitive Status: Impaired/Different from baseline Area of Impairment: Attention;Memory;Awareness;Problem solving                   Current Attention Level: Sustained Memory: Decreased short-term memory     Awareness: Intellectual Problem Solving: Slow processing;Decreased initiation;Difficulty sequencing;Requires verbal cues;Requires tactile cues General Comments: Difficult to fully assess cognition due to low speaking volume and with family present. Pt was hyperfocused on use of suction or having others assist, rinsing mouth, etc. Pt with decreased memory on recall of where ADL items placed, slower processing with increased time needed to follow directions   General Comments  SpO2 93% on 1 L O2. Placed back on initial 2 L O2 at 97% (likely for comfort). HR WFL. Pt daughter and wife  present    Exercises     Shoulder Instructions      Home Living Family/patient expects to be discharged to:: Private residence Living Arrangements: Spouse/significant other (daughter lives nearby) Available Help at Discharge: Family;Available 24 hours/day Type of Home: House Home Access: Stairs to enter [employer] of Steps: 3-6 Entrance Stairs-Rails: None Home Layout: Multi-level;1/2 bath on main level;Bed/bath upstairs Alternate Level Stairs-Number of Steps: flight Alternate Level Stairs-Rails: Left (L ascending half-way then bil handrails final half after landing) Bathroom Shower/Tub: Occupational psychologist: Standard     Home Equipment: Environmental consultant - 2 wheels;Walker - 4 wheels;Bedside commode;Shower seat          Prior Functioning/Environment Level of Independence: Needs assistance  Gait / Transfers Assistance Needed: Prior to onset of back pain he was independent with all functional mobility and ADLs without AD/AE. As the back pain began he started to use AD for mobility. He has declined further recently to not ambulating the past ~4 weeks and has been bed bound. ADL's / Homemaking Assistance Needed: Prior to onset of back pain he was independent with all functional mobility and ADLs without AD/AE .He has declined further recently to being bedbound and needing assistance for all ADLs. Pt has begun to have bird baths in bed. Use of urinal vs condom caths at home  OT Problem List: Decreased strength;Decreased activity tolerance;Decreased cognition;Pain;Cardiopulmonary status limiting activity      OT Treatment/Interventions: Self-care/ADL training;Energy conservation;Therapeutic exercise;DME and/or AE instruction;Therapeutic activities;Patient/family education    OT Goals(Current goals can be found in the care plan section) Acute Rehab OT Goals Patient Stated Goal: get appropriate DME for pt, maximize home setup for pt comfort & care OT Goal  Formulation: With patient/family Time For Goal Achievement: 01/25/21 Potential to Achieve Goals: Fair ADL Goals Pt Will Perform Eating: with set-up;bed level;sitting Pt Will Perform Grooming: with set-up;sitting;bed level Additional ADL Goal #1: Pt/family to demonstrate understanding of skin integrity maintenance strategies with min verbal cues Additional ADL Goal #2: Pt/family to demonstrate safe assist in completing ADLs at bed level  OT Frequency: Min 2X/week   Barriers to D/C:            Co-evaluation PT/OT/SLP Co-Evaluation/Treatment: Yes Reason for Co-Treatment: For patient/therapist safety;To address functional/ADL transfers;Complexity of the patient's impairments (multi-system involvement) PT goals addressed during session: Mobility/safety with mobility OT goals addressed during session: ADL's and self-care      AM-PAC OT "6 Clicks" Daily Activity     Outcome Measure Help from another person eating meals?: A Lot Help from another person taking care of personal grooming?: A Lot Help from another person toileting, which includes using toliet, bedpan, or urinal?: Total Help from another person bathing (including washing, rinsing, drying)?: A Lot Help from another person to put on and taking off regular upper body clothing?: A Lot Help from another person to put on and taking off regular lower body clothing?: Total 6 Click Score: 10   End of Session Equipment Utilized During Treatment: Oxygen Nurse Communication: Mobility status  Activity Tolerance: Patient limited by pain Patient left: in bed;with call bell/phone within reach;with bed alarm set;with family/visitor present  OT Visit Diagnosis: Muscle weakness (generalized) (M62.81);Pain Pain - Right/Left: Left Pain - part of body:  (back/side)                Time: 1829-9371 OT Time Calculation (min): 41 min Charges:  OT General Charges $OT Visit: 1 Visit OT Evaluation $OT Eval Moderate Complexity: 1 Mod OT  Treatments $Self Care/Home Management : 8-22 mins  Malachy Chamber, OTR/L Acute Rehab Services Office: [phone]   Layla Maw 01/11/2021, 10:52 AM

## 2021-01-11 NOTE — TOC Initial Note (Signed)
Transition of Care (TOC) - Initial/Assessment Note  Marvetta Gibbons RN, BSN Transitions of Care Unit 4E- RN Case Manager See Treatment Team for direct phone #    Patient Details  Name: Cody Carr MRN: [id number] Date of Birth: [birth year]  Transition of Care [facility]) CM/SW Contact:    Dawayne Patricia, RN Phone Number: 01/11/2021, 2:17 PM  Clinical Narrative:                 Family requested to speak with CM regarding transition needs. Spoke with daughter [name] and spouse Jeani Hawking at the bedside- pt resting during the conversation.  Discussed plan for pt to return home with family- the state that pt has RW and BSC from his previous discharge from [facility]. They were also trying to get a hospital bed but report that they feel that "fell through the cracks" as they have not heard from anyone regarding bed. They also want to see if pt can have home 02. Explained to them that pt has to qualify under Medicare guidelines for home 02- but that this CM will speak to MD regarding this and have pt assessed for this. Will look into bed to see if there is any outstanding order for that.  Also discussed transportation home- which family feel that they can not safely transport pt home- awaiting PT/OT evals to see how pt tolerates. Anticipate need for PTAR transport.  Address, phone# and PCP all confirmed in epic  PTA pt had been referred to [facility] for [place] needs- family would like to continue [facility] services with [place] - have confirmed pt had services for RN/PT/OT- will need orders to resume [place] at discharge  Call made to [facility] regarding DME- per [facility] they do not have an order for hospital bad- only orders from [facility]- were for RW and [facility] which pt has.  MD has placed order for bed as well as given verbal order for lift and high back w/c based on therapy recs and family request for DME. - [employer] has been called for DME needs- and request for bed, lift and w/c placed - with request for delivery by  tomorrow as per MD pt with possible d/c over the weekend if pain controlled.   Family also discussed desire to complete HCPOA papers while patient is here- they have packet at the bedside that they are working on- have advised them that Chaplain services can assist with that when they are ready- they are to let bedside RN know when they are ready for Chaplain to come.   TOC to continue to follow for transition needs.   Expected Discharge Plan: [place] Barriers to Discharge: Continued Medical Work up   Patient Goals and CMS Choice Patient states their goals for this hospitalization and ongoing recovery are:: return home with family CMS Medicare.gov Compare Post Acute Care list provided to:: Patient Represenative (must comment) (daughter/wife) Choice offered to / list presented to : Spouse, Adult Children  Expected Discharge Plan and Services Expected Discharge Plan: [place]   Discharge Planning Services: CM Consult Post Acute Care Choice: Durable Medical Equipment, Home Health Living arrangements for the past 2 months: Single Family Home                 DME Arranged: Hospital bed, Wheelchair manual DME Agency: [facility] Date DME Agency Contacted: 01/11/21 Time DME Agency Contacted: [id number] Representative spoke with at DME Agency: [place]: [facility]  Prior Living Arrangements/Services Living arrangements for the past 2 months: Single Family Home Lives with:: Spouse, Self, Adult Children Patient language and need for interpreter reviewed:: Yes Do you feel safe going back to the place where you live?: Yes      Need for Family Participation in Patient Care: Yes (Comment) Care giver support system in place?: Yes (comment) Current home services: DME (RW and BSC) Criminal Activity/Legal Involvement Pertinent to Current Situation/Hospitalization: No - Comment as needed  Activities of Daily Living Home Assistive  Devices/Equipment: None ADL Screening (condition at time of admission) Patient's cognitive ability adequate to safely complete daily activities?: Yes Is the patient deaf or have difficulty hearing?: No Does the patient have difficulty seeing, even when wearing glasses/contacts?: No Does the patient have difficulty concentrating, remembering, or making decisions?: No Patient able to express need for assistance with ADLs?: Yes Does the patient have difficulty dressing or bathing?: Yes Independently performs ADLs?: Yes (appropriate for developmental age) Does the patient have difficulty walking or climbing stairs?: Yes Weakness of Legs: Both Weakness of Arms/Hands: Both  Permission Sought/Granted Permission sought to share information with : Chartered certified accountant granted to share information with : Yes, Verbal Permission Granted  Share Information with NAME: [name]  Permission granted to share info w AGENCY: HH/DME  Permission granted to share info w Relationship: daughter     Emotional Assessment Appearance:: Appears older than stated age Attitude/Demeanor/Rapport: Other (comment) (sleeping) Affect (typically observed): Unable to Assess Orientation: : Oriented to Self, Oriented to Place, Oriented to  Time, Oriented to Situation Alcohol / Substance Use: Not Applicable Psych Involvement: No (comment)  Admission diagnosis:  Back pain [M54.9] Acute respiratory failure with hypoxia ([place]) [J96.01] Malignant neoplasm of right lung, unspecified part of lung (HCC) [C34.91] Acute respiratory distress [R06.03] Patient Active Problem List   Diagnosis Date Noted   Pressure injury of skin 01/10/2021   Acute respiratory distress 01/09/2021   Malignant neoplasm of right lung (HCC)    Acute respiratory failure with hypoxia (HCC) 01/08/2021   Metastatic cancer ([place]) 01/08/2021   CAP (community acquired pneumonia) 01/08/2021   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

## 2021-01-11 NOTE — Progress Notes (Addendum)
   Durable Medical Equipment (From admission, onward)        Start     Ordered  01/11/21 1409  For home use only DME lightweight manual wheelchair with seat cushion  Once      Comments: Patient suffers from metastatic lung cancer which impairs their ability to perform daily activities like bathing, dressing, grooming, toileting in the home.  A walker will not resolve  issue with performing activities of daily living. A wheelchair will allow patient to safely perform daily activities. Patient is not able to propel themselves in the home using a standard weight wheelchair due to general weakness. Patient can self propel in the lightweight wheelchair. Length of need Lifetime. Accessories: elevating leg rests (ELRs), wheel locks, extensions and anti-tippers. Back cushion  high-back reclining   01/11/21 1411  01/11/21 1354  For home use only DME Hospital bed  Once      Question Answer Comment Length of Need 6 Months  Patient has (list medical condition): metastatic lung cancer  The above medical condition requires: Patient requires the ability to reposition frequently  Bed type Semi-electric  Hoyer Lift Yes  Support Surface: Gel Overlay    01/11/21 1354

## 2021-01-11 NOTE — Consult Note (Signed)
   [facility] CM Inpatient Consult   01/11/2021  AGAMJOT KILGALLON 15-Sep-1945 [id number]  [place] Organization [ACO] Patient: Cody Carr PPO Primary Care Provider:  Monico Blitz, MD [facility]  [facility] RN referral:  We have received your referral request regarding community support needs.    Plan:  Will review patient for PT/OT recommendations and consults for information and disposition/TOC needs. Will follow for progress.  Natividad Brood, RN BSN [facility] Liaison  [phone] business mobile phone Toll free office [phone]  Fax number: [phone] [email or web address]

## 2021-01-11 NOTE — Progress Notes (Signed)
PROGRESS NOTE    Cody Carr   JSE:[id number]  DOB: 04-15-1946  PCP: Cody Blitz, MD    DOA: 01/08/2021 LOS: 2    Brief Narrative / Hospital Course to Date:   75 y.o. male, with history of nephrolithiasis, hyperlipidemia, hypertension, prostate cancer with seeds, throat cancer status post chemoradiation per patient, and more presented the [facility] ED with a chief complaint of back pain, constipation, and poor p.o. intake.  CT showed right-sided pleural effusion, large necrotic appearing mass in the posterior right lower lobe appearing to cross the right diaphragm extending into adjacent liver.  Imaging also revealed mediastinal lymphadenopathy, lytic lesions in the lumbar spine concerning for metastatic disease enhancing soft tissues mass in the left gluteal muscle, bilateral renal lesions.  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  Patient was seen by oncology.  Transferred to [facility] for IR to obtain tissue for biopsy.   Assessment & Plan   Active Problems:   Acute respiratory failure with hypoxia (HCC)   Metastatic cancer (HCC)   CAP (community acquired pneumonia)   Malignant pleural effusion   Constipation   Hypercalcemia   Protein-calorie malnutrition, moderate (HCC)   Acute respiratory distress   Malignant neoplasm of right lung (HCC)   Pressure injury of skin   Acute respiratory failure with hypoxia secondary to postobstructive pneumonia versus atelectasis --Supplement O2 to maintain sats above 90% --Continue empiric antibiotics started on admission --Pulmonary hygiene with I-S and flutter --Monitor Pro-Cal --Bronchodilators as needed  Metastatic disease -newly diagnosed -appears to have primary lung malignancy. Right lower lobe lung mass IR performed CT-guided lung biopsy today 9/8 --Follow-up pathology --Oncology consulted, patient seen by Dr. 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  Treated with Zometa on 9/7. --Continue aggressive IV fluids  Right pleural effusion, small - most likely malignant.  Further evaluation per oncology.  Will consider thoracentesis if difficulty weaning off O2.  Community-acquired pneumonia vs postobstructive pneumonia/atelectasis -continue empiric Rocephin and Zithromax.  Mucinex.  Pulmonary hygiene.  Follow-up procalcitonin trend.  Constipation -likely opioid induced. --Bowel regimen per orders, enemas if needed  Moderate protein calorie malnutrition -encourage p.o. intake and continue supplements.   --Consult dietitian  Hyperlipidemia -continue statin  Hypertension -continue lisinopril.  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  Further complicates lung malignancy and overall prognosis unfortunately.   Patient BMI: There is no height or weight on file to calculate BMI.   DVT prophylaxis: heparin injection 5,000 Units Start: 01/11/21 0600 SCDs Start: 01/08/21 2224   Diet:  Diet Orders (From admission, onward)     Start     Ordered   01/11/21 1533  Diet Heart Room service appropriate? Yes with Assist; Fluid consistency: Thin  Diet effective now       Question Answer Comment  Room service appropriate? Yes with Assist   Fluid consistency: Thin      01/11/21 1532              Code Status: Full Code   Subjective 01/11/21    Patient awake laying in bed, seen with daughter and wife at bedside.  He's having pain on his bottom in area of pressure.  Also has new pain along the  back of left shoulder.  Not sure if this correlates with mets or from manipulation/movement for imaging and biopsy yesterday.  He declines to have a xray to  evaluate it.     Disposition Plan & Communication   Status is: Inpatient  Remains inpatient appropriate because:  Pain uncontrolled.  DME required at home prior to d/c.  Dispo: The patient is from: Home              Anticipated d/c is to: Home              Patient currently is not medically stable to d/c.   Difficult to place patient No   Family Communication: Wife and daughter at bedside on rounds today   Consults, Procedures, Significant Events   Consultants:  Interventional radiology Oncology Palliative care  Procedures:  CT-guided right lower lobe lung biopsy 01/11/2019  Antimicrobials:  Anti-infectives (From admission, onward)    Start     Dose/Rate Route Frequency Ordered Stop   01/09/21 2000  cefTRIAXone (ROCEPHIN) 2 g in sodium chloride 0.9 % 100 mL IVPB        2 g 200 mL/hr over 30 Minutes Intravenous Every 24 hours 01/08/21 2224 01/14/21 1959   01/09/21 2000  azithromycin (ZITHROMAX) 500 mg in sodium chloride 0.9 % 250 mL IVPB        500 mg 250 mL/hr over 60 Minutes Intravenous Every 24 hours 01/08/21 2224 01/14/21 1959   01/08/21 1930  cefTRIAXone (ROCEPHIN) 1 g in sodium chloride 0.9 % 100 mL IVPB        1 g 200 mL/hr over 30 Minutes Intravenous  Once 01/08/21 1929 01/08/21 2053   01/08/21 1930  azithromycin (ZITHROMAX) 500 mg in sodium chloride 0.9 % 250 mL IVPB        500 mg 250 mL/hr over 60 Minutes Intravenous  Once 01/08/21 1929 01/08/21 2219         Micro    Objective   Vitals:   01/11/21 0746 01/11/21 0856 01/11/21 1141 01/11/21 1300  BP: (!) 143/86  (!) 158/78   Pulse: 75  71 75  Resp: 16 18 18 13   Temp: 97.6 F (36.4 C)  97.6 F (36.4 C)   TempSrc: Oral  Oral   SpO2: 97%  100% 99%    Intake/Output Summary (Last 24 hours) at 01/11/2021 1539 Last data filed at 01/11/2021 1505 Gross per 24 hour  Intake 1575.71 ml  Output 1350 ml  Net 225.71 ml   There were no vitals filed for this visit.  Physical Exam:  General exam: awake,  alert, no acute distress, underweight, appears in pain at times Respiratory system: Diminished throughout worse at right base, on 2 L/min nasal cannula oxygen, normal respiratory effort Cardiovascular system: normal S1/S2, RRR, no pedal edema.   Gastrointestinal system: soft, nontender nondistended abdomen. Central nervous system: A&O x3. no gross focal neurologic deficits, normal speech Musculoskeletal: tenderness on palpation of medial border of the left scapula and posterior left ribs ~4-5 Psychiatry: normal mood, congruent affect, judgement and insight appear normal  Labs   Data Reviewed: I have personally reviewed following labs and imaging studies  CBC: Recent Labs  Lab 01/08/21 1532 01/09/21 0522 01/09/21 1850 01/10/21 0126 01/11/21 0509  WBC 22.9* 21.3* 23.3* 19.0* 21.0*  NEUTROABS 20.6* 19.0*  --   --   --   HGB 11.8* 10.7* 10.5* 9.3* 9.2*  HCT 38.0* 35.6* 34.6* 29.8* 30.1*  MCV 87.2 88.3 87.8 87.4 88.5  PLT  563* 535* 529* 407* 401*   Basic Metabolic Panel: Recent Labs  Lab 01/08/21 1532 01/09/21 0522 01/10/21 0126 01/11/21 0509  NA 136 143 137 140  K 3.7 3.6 3.3* 3.1*  CL 101 107 105 104  CO2 28 28 26 25   GLUCOSE 93 85 81 74  BUN 28* 24* 17 15  CREATININE 0.73 0.68 0.70 0.71  CALCIUM 10.7* 10.6* 10.4* 10.1  MG  --   --  1.9 1.9   GFR: CrCl cannot be calculated (Unknown ideal weight.). Liver Function Tests: Recent Labs  Lab 01/08/21 1532 01/09/21 0522 01/10/21 0126 01/11/21 0509  AST 19 15 14* 13*  ALT 22 17 15 15   ALKPHOS 175* 157* 124 118  BILITOT 1.3* 1.0 1.2 1.5*  PROT 5.7* 5.3* 4.7* 4.7*  ALBUMIN 2.2* 2.0* 1.6* 1.6*   Recent Labs  Lab 01/08/21 1532  LIPASE 24   No results for input(s): AMMONIA in the last 168 hours. Coagulation Profile: Recent Labs  Lab 01/10/21 0804  INR 1.5*   Cardiac Enzymes: No results for input(s): CKTOTAL, CKMB, CKMBINDEX, TROPONINI in the last 168 hours. BNP (last 3 results) No results for input(s): PROBNP  in the last 8760 hours. HbA1C: No results for input(s): HGBA1C in the last 72 hours. CBG: No results for input(s): GLUCAP in the last 168 hours. Lipid Profile: No results for input(s): CHOL, HDL, LDLCALC, TRIG, CHOLHDL, LDLDIRECT in the last 72 hours. Thyroid Function Tests: Recent Labs    01/09/21 0857  TSH 1.854   Anemia Panel: No results for input(s): VITAMINB12, FOLATE, FERRITIN, TIBC, IRON, RETICCTPCT in the last 72 hours. Sepsis Labs: Recent Labs  Lab 01/09/21 0857  PROCALCITON 0.30    Recent Results (from the past 240 hour(s))  SARS CORONAVIRUS 2 (TAT 6-24 HRS) Nasopharyngeal Nasopharyngeal Swab     Status: None   Collection Time: 01/08/21  9:27 PM   Specimen: Nasopharyngeal Swab  Result Value Ref Range Status   SARS Coronavirus 2 NEGATIVE NEGATIVE Final    Comment: (NOTE) SARS-CoV-2 target nucleic acids are NOT DETECTED.  The SARS-CoV-2 RNA is generally detectable in upper and lower respiratory specimens during the acute phase of infection. Negative results do not preclude SARS-CoV-2 infection, do not rule out co-infections with other pathogens, and should not be used as the sole basis for treatment or other patient management decisions. Negative results must be combined with clinical observations, patient history, and epidemiological information. The expected result is Negative.  Fact Sheet for Patients: SugarRoll.be  Fact Sheet for Healthcare Providers: https://www.woods-mathews.com/  This test is not yet approved or cleared by the [place] FDA and  has been authorized for detection and/or diagnosis of SARS-CoV-2 by FDA under an Emergency Use Authorization (EUA). This EUA will remain  in effect (meaning this test can be used) for the duration of the COVID-19 declaration under Se ction 564(b)(1) of the Act, 21 U.S.C. section 360bbb-3(b)(1), unless the authorization is terminated or revoked sooner.  Performed  at [facility], [place] [address], [place], [place] [address]       Imaging Studies   MR THORACIC SPINE W WO CONTRAST  Result Date: 01/11/2021 CLINICAL DATA:  Initial evaluation for metastatic workup. EXAM: MRI THORACIC WITHOUT AND WITH CONTRAST TECHNIQUE: Multiplanar and multiecho pulse sequences of the thoracic spine were obtained without and with intravenous contrast. CONTRAST:  67mL GADAVIST GADOBUTROL 1 MMOL/ML IV SOLN COMPARISON:  Prior CTs from 01/08/2021. FINDINGS: Alignment: Physiologic with preservation of the normal thoracic kyphosis. No  listhesis. Vertebrae: Large metastatic implant involving the left aspect of the T1 and T2 vertebral bodies is seen. Lesion measures approximately 5.1 x 3.2 x 3.7 cm (series 19, image 8). Involvement of the adjacent left posterior T2 and T3 ribs noted. Invasion of the adjacent left T2-3 and T3-4 neural foramina. The left T2-3 neural foramen is largely obliterated, with moderate narrowing at the left T3-4 neural foramen. Mild flattening of the left aspect of the adjacent thecal sac without significant stenosis or additional epidural invasion at this time. No associated pathologic fracture. Additional 1.5 cm osseous metastasis seen at the right anterior aspect of T7 (series 22, image 9). Large metastatic implant involving the right posterior L1 vertebral body as well as the adjacent right posterior elements partially visualized (series 22, image 9). This lesion abuts the right aspect of the thecal sac at the level of T12-L1 (series 18, image 39). No other significant metastatic disease seen elsewhere within the thoracic spine at this time. Vertebral body height maintained. Underlying bone marrow signal intensity diffusely heterogeneous. No evidence for osteomyelitis discitis or septic arthritis. Cord: Normal signal and morphology. Other than the above described lesions at T2-3/T3-4 and L1 abutting the thecal sac, no other discernible epidural involvement. No  other abnormal enhancement. Paraspinal and other soft tissues: Large necrotic mass positioned at the posterior right lung base again seen. Direct extension through the right hemidiaphragm into the adjacent liver noted. Associated irregular right pleural effusion. Right hilar and subcarinal adenopathy. Additional trace left pleural effusion. Findings better evaluated on prior CT. Probable cholelithiasis noted as well. Disc levels: Tiny right paracentral disc protrusion at T7-8 minimally flattens the right ventral thecal sac without significant stenosis. Otherwise, no other significant disc pathology seen within the thoracic spine. No spinal stenosis. Mild bilateral foraminal narrowing noted at T10-11 due to facet disease. IMPRESSION: 1. Large metastatic implant involving the left aspect of the T1 and T2 vertebral bodies with invasion of the adjacent left T2-3 and T3-4 neural foramina. 2. Additional 1.5 cm metastatic implant involving the right anterior aspect of T7. 3. Large metastatic implant involving the right posterior L1 vertebral body and adjacent posterior elements, partially visualized. 4. No other significant metastatic involvement within the thoracic spine at this time. No other epidural involvement. No pathologic fracture. 5. Large necrotic mass at the posterior right lung base with invasion of the adjacent right hemidiaphragm and liver, with associated right pleural effusion and mediastinal adenopathy. Findings better evaluated on prior CT. Electronically Signed   By: Jeannine Boga M.D.   On: 01/11/2021 04:15   DG Chest Port 1 View  Result Date: 01/10/2021 CLINICAL DATA:  Status post biopsy of right lower lobe lung mass. EXAM: PORTABLE CHEST 1 VIEW COMPARISON:  CT chest 01/08/2021 FINDINGS: Intentionally extra tori image of the chest demonstrates no pneumothorax. Low lung volumes. Bandlike density along the right mid lung attributable to fluid in the minor fissure. Retro diaphragmatic density on  the right compatible with known right lower lobe lesion. Stable mild peripheral interstitial accentuation in the lungs. Atherosclerotic calcification of the aortic arch. Degenerative glenohumeral arthropathy, left greater than right, with some volume loss in the left humeral head. Thoracic spondylosis noted. The known lytic lesion involving the left third rib and left third and fourth vertebra is relatively difficult to visualize on today's conventional radiograph. IMPRESSION: 1. No pneumothorax. 2. Trace fluid in the right minor fissure. 3. Mild chronic interstitial accentuation in the lung periphery. 4. Left greater than right glenohumeral arthropathy. 5.  Aortic Atherosclerosis (ICD10-I70.0). 6. Retro diaphragmatic density on the right compatible with known mass. Electronically Signed   By: Van Clines M.D.   On: 01/10/2021 14:51   CT LUNG MASS BIOPSY  Result Date: 01/10/2021 INDICATION: 75 year old male with indeterminate right lower lobe lung mass. EXAM: CT-guided lung biopsy COMPARISON:  CT chest from 01/08/2021 MEDICATIONS: None. ANESTHESIA/SEDATION: Fentanyl 50 mcg IV; Versed 1 mg IV Sedation time: 13 minutes; The patient was continuously monitored during the procedure by the interventional radiology nurse under my direct supervision. CONTRAST:  None COMPLICATIONS: None immediate. PROCEDURE: Informed consent was obtained from the patient following an explanation of the procedure, risks, benefits and alternatives. The patient understands,agrees and consents for the procedure. All questions were addressed. A time out was performed prior to the initiation of the procedure. The patient was positioned in the left lateral decubitus position on the CT table and a limited chest CT was performed for procedural planning demonstrating similar-appearing right lower lobe mass. The operative site was prepped and draped in the usual sterile fashion. Under sterile conditions and local anesthesia, a 17 gauge  coaxial needle was advanced into the peripheral aspect of the nodule. Positioning was confirmed with intermittent CT fluoroscopy and followed by the acquisition of total of 3 core samples with an 18 gauge core needle biopsy device. The coaxial needle was removed following deployment of a [employer] plug and superficial hemostasis was achieved with manual compression. Limited post procedural chest CT was negative for pneumothorax or additional complication. A dressing was placed. The patient tolerated the procedure well without immediate postprocedural complication. The patient was escorted to have an upright chest radiograph. IMPRESSION: Technically successful CT guided core needle core biopsy of right lower lung mass. Ruthann Cancer, MD [facility] Radiology Electronically Signed   By: Ruthann Cancer M.D.   On: 01/10/2021 13:47     Medications   Scheduled Meds:  heparin  5,000 Units Subcutaneous Q8H   lactose free nutrition  237 mL Oral TID WC   lisinopril  2.5 mg Oral QPM   mouth rinse  15 mL Mouth Rinse BID   multivitamin with minerals  1 tablet Oral Daily   polyethylene glycol  17 g Oral BID   potassium chloride  40 mEq Oral Q4H   rosuvastatin  5 mg Oral QPM   senna-docusate  2 tablet Oral BID   Continuous Infusions:  sodium chloride 100 mL/hr at 01/11/21 1319   azithromycin Stopped (01/10/21 2332)   cefTRIAXone (ROCEPHIN)  IV Stopped (01/10/21 2122)   methocarbamol (ROBAXIN) IV         LOS: 2 days    Time spent: 45 minutes with greater than 50% spent at bedside and coordinating care    Ezekiel Slocumb, DO [facility]  01/11/2021, 3:39 PM      If 7PM-7AM, please contact night-coverage. How to contact the [facility] Attending or Consulting provider [place] or covering provider during after hours [place], for this patient?    Check the care team in [facility] and look for a) attending/consulting [facility] provider listed and b) the [facility] team  listed Log into [email or web address] and use [place] universal password to access. If you do not have the password, please contact the hospital operator. Locate the [facility] provider you are looking for under [facility] and page to a number that you can be directly reached. If you still have difficulty reaching the provider, please page the [facility] (Director on Call) for  the Hospitalists listed on amion for assistance.

## 2021-01-11 NOTE — Evaluation (Signed)
Physical Therapy Evaluation Patient Details Name: Cody Carr MRN: [id number] DOB: [birth year] Today's Date: 01/11/2021   History of Present Illness  Pt is a 75 y.o. male who presented 01/08/21 to ED with back pain, constipation and poor PO intake. Pt with hx of progressive back pain up to 3 months ago and believed it was a mechanical or nerve injury. Pt with recent hospitalization at [facility] for constipation and dehydration. CT showed small R pleural effusion with large necrotic appearing mass in R lower lobe that extends into diaphragm and liver. 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  PMH: nephrolithiasis, hyperlipidemia, hypertension, prostate cancer with seeds, throat cancer status post chemoradiation.`   Clinical Impression  Pt presents with condition above and deficits mentioned below, see PT Problem List. PTA, pt was completely Independent with all mobility and tasks without AD up until approx 3 months ago. Recently, pt with progressive back pain and unable to complete ADLs/mobility without assist and has been bed bound. Pt displays significant pain impacting his ability to mobilize this date. In addition, he displays deficits in cognition and general strength and coordination. Pt unable to successfully sit EOB despite Total x 2 assist due to L side/back pain. Majority of session focused on pt/family collaboration and education on: skin integrity with turning schedule, DME needs, and problem-solving home setup. Pending [place] discussions, rec HHPT follow-up to maximize pt/caregiver safety with functional mobility. Pt's daughter plans to provide 24/7 caregiver support along with her mother. Will continue to follow acutely.   Recommend reclining back wheelchair, lift, and hospital bed at home with features noted below that will decrease risk of skin breakdown and maximize pt comfort.     Follow Up Recommendations Home health PT;Supervision/Assistance - 24 hour (pending [place]  discussions)    Equipment Recommendations  Wheelchair (measurements PT);Wheelchair cushion (measurements PT);Hospital bed;Other (comment) (lift equipment; air mattress for hospital bed; roho cushion for w/c; high-back reclining w/c with elevating leg rests; non-emergency ambulance transport home)    Recommendations for Other Services       Precautions / Restrictions Precautions Precautions: Back;Fall Precaution Booklet Issued: No Precaution Comments: Back precautions for comfort Restrictions Weight Bearing Restrictions: No      Mobility  Bed Mobility Overal bed mobility: Needs Assistance Bed Mobility: Rolling;Supine to Sit;Sit to Supine Rolling: Max assist;+2 for safety/equipment   Supine to sit: Total assist;+2 for physical assistance;+2 for safety/equipment Sit to supine: Total assist;+2 for physical assistance;+2 for safety/equipment   General bed mobility comments: Max A x 1-2 for rolling to side in attempts for log rolling. Attempted to sit EOB though pt unable to achieve due to back pain. Total A x 2 for EOB attempts and return to sidelying.    Transfers                 General transfer comment: unable  Ambulation/Gait             General Gait Details: Unable  Stairs            Wheelchair Mobility    Modified Rankin (Stroke Patients Only)       Balance                                             Pertinent Vitals/Pain Pain Assessment: Faces Faces Pain Scale: Hurts worst Pain Location: L back/side with bed  mobility attempts Pain Descriptors / Indicators: Grimacing;Guarding;Moaning;Shooting Pain Intervention(s): Limited activity within patient's tolerance;Monitored during session;Repositioned;Premedicated before session    [place] expects to be discharged to:: Private residence Living Arrangements: Spouse/significant other (daughter lives nearby) Available Help at Discharge: Family;Available 24  hours/day Type of Home: House Home Access: Stairs to enter Entrance Stairs-Rails: None Entrance Stairs-Number of Steps: 3-6 Home Layout: Multi-level;1/2 bath on main level;Bed/bath upstairs Home Equipment: Walker - 2 wheels;Walker - 4 wheels;Bedside commode;Shower seat      Prior Function Level of Independence: Needs assistance   Gait / Transfers Assistance Needed: Prior to onset of back pain he was independent with all functional mobility and ADLs without AD/AE. As the back pain began he started to use AD for mobility. He has declined further recently to not ambulating the past ~4 weeks and has been bed bound.  ADL's / Homemaking Assistance Needed: Prior to onset of back pain he was independent with all functional mobility and ADLs without AD/AE .He has declined further recently to being bedbound and needing assistance for all ADLs. Pt has begun to have bird baths in bed.        Hand Dominance   Dominant Hand: Right    Extremity/Trunk Assessment   Upper Extremity Assessment Upper Extremity Assessment: Defer to OT evaluation    Lower Extremity Assessment Lower Extremity Assessment: Generalized weakness    Cervical / Trunk Assessment Cervical / Trunk Assessment: Other exceptions (metastatic massess in spine)  Communication   Communication: Other (comment) (hoarse, low volume)  Cognition Arousal/Alertness: Awake/alert Behavior During Therapy: Anxious;Flat affect;Restless Overall Cognitive Status: Impaired/Different from baseline Area of Impairment: Attention;Memory;Awareness;Problem solving                   Current Attention Level: Sustained Memory: Decreased short-term memory     Awareness: Intellectual Problem Solving: Slow processing;Decreased initiation;Difficulty sequencing;Requires verbal cues;Requires tactile cues General Comments: Difficult to fully assess cognition due to low speaking volume and with family present. Pt was hyperfocused on use of suction  or having others assist, rinsing mouth, etc. Pt with decreased memory on recall of where ADL items placed, slower processing with increased time needed to follow directions      General Comments General comments (skin integrity, edema, etc.): SpO2 93% on 1 L O2. Placed back on initial 2 L O2 at 97% (likely for comfort). HR WFL. Pt daughter and wife present; educated family on regular rolling to provided pressure relied    Exercises     Assessment/Plan    PT Assessment Patient needs continued PT services  PT Problem List Decreased strength;Decreased range of motion;Decreased activity tolerance;Decreased balance;Decreased coordination;Decreased mobility;Decreased cognition;Decreased knowledge of use of DME;Decreased safety awareness;Cardiopulmonary status limiting activity;Pain       PT Treatment Interventions DME instruction;Gait training;Stair training;Functional mobility training;Therapeutic activities;Therapeutic exercise;Balance training;Neuromuscular re-education;Cognitive remediation;Patient/family education;Wheelchair mobility training    PT Goals (Current goals can be found in the Care Plan section)  Acute Rehab PT Goals Patient Stated Goal: get appropriate DME for pt, maximize home setup for pt comfort & care PT Goal Formulation: With patient/family Time For Goal Achievement: 01/25/21 Potential to Achieve Goals: Poor    Frequency Min 3X/week   Barriers to discharge        Co-evaluation PT/OT/SLP Co-Evaluation/Treatment: Yes Reason for Co-Treatment: Complexity of the patient's impairments (multi-system involvement);For patient/therapist safety;To address functional/ADL transfers PT goals addressed during session: Mobility/safety with mobility OT goals addressed during session: ADL's and self-care       AM-PAC  PT "6 Clicks" Mobility  Outcome Measure Help needed turning from your back to your side while in a flat bed without using bedrails?: A Lot Help needed moving  from lying on your back to sitting on the side of a flat bed without using bedrails?: Total Help needed moving to and from a bed to a chair (including a wheelchair)?: Total Help needed standing up from a chair using your arms (e.g., wheelchair or bedside chair)?: Total Help needed to walk in hospital room?: Total Help needed climbing 3-5 steps with a railing? : Total 6 Click Score: 7    End of Session Equipment Utilized During Treatment: Oxygen Activity Tolerance: Patient limited by pain Patient left: in bed;with call bell/phone within reach;with bed alarm set;with family/visitor present Nurse Communication: Mobility status PT Visit Diagnosis: Muscle weakness (generalized) (M62.81);Difficulty in walking, not elsewhere classified (R26.2);Pain Pain - Right/Left: Left Pain - part of body:  (back)    Time: 5732-2567 PT Time Calculation (min) (ACUTE ONLY): 41 min   Charges:   PT Evaluation $PT Eval Moderate Complexity: 1 Mod          Moishe Spice, PT, DPT Acute Rehabilitation Services  Pager: [phone] Office: [phone]   Orvan Falconer 01/11/2021, 11:45 AM

## 2021-01-11 NOTE — Progress Notes (Addendum)
Initial Nutrition Assessment  DOCUMENTATION CODES:  Not applicable  INTERVENTION:  Add Boost Plus po TID, each supplement provides 360 kcal and 14 grams of protein.  Discontinue Ensure Enlive BID.  Add Magic cup TID with meals, each supplement provides 290 kcal and 9 grams of protein.  Add MVI with minerals daily.  Obtain updated weight.  Encourage PO intake.  NUTRITION DIAGNOSIS:  Inadequate oral intake related to poor appetite as evidenced by per patient/family report.  GOAL:  Patient will meet greater than or equal to 90% of their needs  MONITOR:  PO intake, Supplement acceptance, Labs, Weight trends, Skin, I & O's  REASON FOR ASSESSMENT:  Consult Assessment of nutrition requirement/status  ASSESSMENT:  75 yo male who presented 01/08/21 to ED with back pain, constipation and poor PO intake. Pt with hx of progressive back pain up to 3 months ago and believed it was a mechanical or nerve injury. Pt with recent hospitalization at [facility] for constipation and dehydration. CT showed small R pleural effusion with large necrotic appearing mass in R lower lobe that extends into diaphragm and liver. 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  PMH: nephrolithiasis, hyperlipidemia, hypertension, prostate cancer with seeds, throat cancer status post chemoradiation.`  Pt unavailable at time of RD visit.  Pt reports loss of appetite.  Pt's weight has been trending down per Epic, but pt has not been weighed. RD to order a measured weight for this admission.  Recommend switching Ensure to Boost Plus TID. Add Magic Cup TID and MVI with minerals daily.  Supplements: Ensure Enlive BID (refused)  Medications: reviewed; miralax BID, Klor-Con 40 mEq, Senokot BID, NaCl @ 100 ml/hr  Labs: reviewed; K 3.1 (L)  NUTRITION - FOCUSED PHYSICAL EXAM: Unable to complete  Diet Order:   Diet Order             Diet Heart Room service appropriate? Yes; Fluid consistency:  Thin  Diet effective now                  EDUCATION NEEDS:  No education needs have been identified at this time  Skin:  Skin Assessment: Skin Integrity Issues: Skin Integrity Issues:: Stage II, Other (Comment) Stage II: PI - mid sacrum Other: Puncture - R back  Last BM:  01/09/21  Height:  Ht Readings from Last 1 Encounters:  08/24/20 6\' 1"  (1.854 m)   Weight:  Wt Readings from Last 1 Encounters:  08/24/20 87.5 kg   BMI:  There is no height or weight on file to calculate BMI.  Estimated Nutritional Needs:  Kcal:  1950-2150 Protein:  95-110 grams Fluid:  >1.95 L  Derrel Nip, RD, LDN (she/her/hers) Registered Dietitian I After-Hours/Weekend Pager # in [place]

## 2021-01-11 NOTE — Progress Notes (Signed)
   Daily Progress Note   Patient Name: Cody Carr       Date: 01/11/2021 DOB: 12/06/1945  Age: 75 y.o. MRN#: [id number] Attending Physician: Ezekiel Slocumb, DO Primary Care Physician: Monico Blitz, MD Admit Date: 01/08/2021  Reason for Consultation/Follow-up: Establishing goals of care  Subjective: Chart Reviewed. Updates Received. Patient Assessed.   Patient awake and alert in bed. Complains of pain but states it is much better controlled today which he is appreciative of. Denies shortness of breath.  I spoke at length with Cody Carr daughter, [name]. Education provided on current pain regimen with concerns of what regimen will look like at discharge. Detailed discussion regarding patient's poor nutrition and risk for recurrent dehydration. Family inquiring about coping mechanisms.   They are remaining hopeful for options related to Cody Carr cancer. Discussions regarding advanced directives and patient's wishes were approached. Family has the documentation and would like to discuss further tomorrow with hopes Cody Carr will be interested in further discussions and sharing Cody Carr wishes with the family.    All questions answered. Family requesting follow-up visit for ongoing discussions tomorrow.    Length of Stay: 2 days  Vital Signs: BP (!) 158/78   Pulse 75   Temp 97.6 F (36.4 C) (Oral)   Resp 13   SpO2 99%  SpO2: SpO2: 99 % O2 Device: O2 Device: Nasal Cannula O2 Flow Rate: O2 Flow Rate (L/min): 2 L/min  Physical Exam: NAD, ill-appearing RRR Diminished, @L  AAO x3, mood appropriate                Palliative Care Assessment & Plan   Code Status: Full code  Goals of Care/Recommendations: Continue with current plan of care. Treat the treatable. Pending further results.  Morphine increased to every 2hr PRN. In agreement and patient reports pain is better managed.  Detailed discussion regarding current illness, nutrition, code status and wishes. Family requesting ongoing  support and discussions.  PMT will continue to support and follow.   Prognosis: Concern for poor prognosis with metastatic disease process undergoing evaluation and declining nutritional status.   Discharge Planning: To Be Determined  Thank you for allowing the Palliative Medicine Team to assist in the care of this patient.  Time Total: 45 min.   Visit consisted of counseling and education dealing with the complex and emotionally intense issues of symptom management and palliative care in the setting of serious and potentially life-threatening illness.Greater than 50%  of this time was spent counseling and coordinating care related to the above assessment and plan.  Alda Lea, AGPCNP-BC  Palliative Medicine Team [phone]

## 2021-01-12 DIAGNOSIS — E440 Moderate protein-calorie malnutrition: Secondary | ICD-10-CM | POA: Diagnosis not present

## 2021-01-12 DIAGNOSIS — J9601 Acute respiratory failure with hypoxia: Secondary | ICD-10-CM | POA: Diagnosis not present

## 2021-01-12 DIAGNOSIS — K5903 Drug induced constipation: Secondary | ICD-10-CM | POA: Diagnosis not present

## 2021-01-12 DIAGNOSIS — C7951 Secondary malignant neoplasm of bone: Secondary | ICD-10-CM | POA: Diagnosis not present

## 2021-01-12 DIAGNOSIS — J910 Malignant pleural effusion: Secondary | ICD-10-CM | POA: Diagnosis not present

## 2021-01-12 DIAGNOSIS — J189 Pneumonia, unspecified organism: Secondary | ICD-10-CM | POA: Diagnosis not present

## 2021-01-12 LAB — BASIC METABOLIC PANEL
Anion gap: 9 (ref 5–15)
BUN: 12 mg/dL (ref 8–23)
CO2: 24 mmol/L (ref 22–32)
Calcium: 9 mg/dL (ref 8.9–10.3)
Chloride: 108 mmol/L (ref 98–111)
Creatinine, Ser: 0.62 mg/dL (ref 0.61–1.24)
GFR, Estimated: >60 mL/min (ref >60)
Glucose, Bld: 70 mg/dL (ref 70–99)
Potassium: 3.2 mmol/L — ABNORMAL LOW (ref 3.5–5.1)
Sodium: 141 mmol/L (ref 135–145)

## 2021-01-12 LAB — CBC
HCT: 30.7 % — ABNORMAL LOW (ref 39.0–52.0)
Hemoglobin: 9.4 g/dL — ABNORMAL LOW (ref 13.0–17.0)
MCH: 26.7 pg (ref 26.0–34.0)
MCHC: 30.6 g/dL (ref 30.0–36.0)
MCV: 87.2 fL (ref 80.0–100.0)
Platelets: 456 10*3/uL — ABNORMAL HIGH (ref 150–400)
RBC: 3.52 MIL/uL — ABNORMAL LOW (ref 4.22–5.81)
RDW: 15.9 % — ABNORMAL HIGH (ref 11.5–15.5)
WBC: 23.5 10*3/uL — ABNORMAL HIGH (ref 4.0–10.5)
nRBC: 0 % (ref 0.0–0.2)

## 2021-01-12 LAB — MAGNESIUM: Magnesium: 1.8 mg/dL (ref 1.7–2.4)

## 2021-01-12 MED ORDER — POTASSIUM CHLORIDE 10 MEQ/100ML IV SOLN
10.0000 meq | INTRAVENOUS | Status: AC
  Administered 2021-01-12 (×4): 10 meq via INTRAVENOUS
  Filled 2021-01-12 (×4): qty 100

## 2021-01-12 MED ORDER — GUAIFENESIN-DM 100-10 MG/5ML PO SYRP
5.0000 mL | ORAL_SOLUTION | Freq: Four times a day (QID) | ORAL | Status: DC
  Administered 2021-01-12 – 2021-01-19 (×22): 5 mL via ORAL
  Filled 2021-01-12: qty 10
  Filled 2021-01-12 (×3): qty 5
  Filled 2021-01-12 (×2): qty 10
  Filled 2021-01-12: qty 5
  Filled 2021-01-12: qty 10
  Filled 2021-01-12: qty 5
  Filled 2021-01-12: qty 10
  Filled 2021-01-12 (×2): qty 5
  Filled 2021-01-12: qty 10
  Filled 2021-01-12: qty 5
  Filled 2021-01-12: qty 10
  Filled 2021-01-12 (×6): qty 5
  Filled 2021-01-12: qty 10
  Filled 2021-01-12: qty 5
  Filled 2021-01-12: qty 10

## 2021-01-12 MED ORDER — MORPHINE SULFATE 10 MG/5ML PO SOLN
10.0000 mg | ORAL | Status: DC | PRN
  Administered 2021-01-12 – 2021-01-19 (×15): 10 mg via ORAL
  Filled 2021-01-12 (×10): qty 6
  Filled 2021-01-12: qty 5
  Filled 2021-01-12: qty 6
  Filled 2021-01-12: qty 5
  Filled 2021-01-12 (×3): qty 6

## 2021-01-12 MED ORDER — METHYLNALTREXONE BROMIDE 12 MG/0.6ML ~~LOC~~ SOLN
12.0000 mg | Freq: Once | SUBCUTANEOUS | Status: AC
  Administered 2021-01-12: 12 mg via SUBCUTANEOUS
  Filled 2021-01-12: qty 0.6

## 2021-01-12 NOTE — Progress Notes (Addendum)
PROGRESS NOTE    Cody Carr   SHF:[id number]  DOB: [birth year]  PCP: Monico Blitz, MD    DOA: 01/08/2021 LOS: 3    Brief Narrative / Hospital Course to Date:   75 y.o. male, with history of nephrolithiasis, hyperlipidemia, hypertension, prostate cancer with seeds, throat cancer status post chemoradiation per patient, and more presented the [facility] ED with a chief complaint of back pain, constipation, and poor p.o. intake.  CT showed right-sided pleural effusion, large necrotic appearing mass in the posterior right lower lobe appearing to cross the right diaphragm extending into adjacent liver.  Imaging also revealed mediastinal lymphadenopathy, lytic lesions in the lumbar spine concerning for metastatic disease enhancing soft tissues mass in the left gluteal muscle, bilateral renal lesions.  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  Patient was seen by oncology.  Transferred to [facility] for IR to obtain tissue for biopsy.   Assessment & Plan   Principal Problem:   Non-small cell carcinoma of lung, stage 4, right (HCC) Active Problems:   Acute respiratory failure with hypoxia (HCC)   CAP (community acquired pneumonia)   Hypercalcemia   Malignant pleural effusion   Constipation   Protein-calorie malnutrition, moderate (HCC)   Pressure injury of skin   Acute respiratory failure with hypoxia secondary to postobstructive pneumonia versus atelectasis 9/10: Patient continues to require 2 to 3 L/min nasal cannula oxygen and has productive cough. --Supplement O2 to maintain sats above 90% --Continue empiric antibiotics  --Pulmonary hygiene with I-S and flutter --Bronchodilators as needed --Home oxygen qualification pending  Stage IV non-small cell lung cancer, newly diagnosed -primary right lower lobe lung mass with metastatic involvement of multiple lumbar and thoracic vertebrae, lumbar paraspinal soft tissue, left gluteus muscle, primary mass  extension across diaphragm into liver, possibly spleen and kidneys.  No brain metastases seen on MRI. IR performed CT-guided lung biopsy 9/8. Pathology shows non-small cell carcinoma. --Oncology consulted, patient seen by Dr. Delton Coombes at [facility] --Palliative care following, appreciate assistance --Pain control - ongoing titration of medications for adequate relief --Bowel regimen  Malignant hypercalcemia -most likely secondary to skeletal lytic lesions.  Treated with Zometa on 9/7. 9/10: Ca 9.0 (corrected 10.9) --Continue aggressive IV fluids  Right pleural effusion, small - most likely malignant.  Further evaluation per oncology.  Will consider thoracentesis if difficulty weaning off O2.  Community-acquired pneumonia vs postobstructive pneumonia/atelectasis -continue empiric Rocephin and Zithromax.  Mucinex.  Pulmonary hygiene.  Follow-up procalcitonin trend.  Constipation -likely opioid induced. --Bowel regimen per orders, enemas if needed  Moderate protein calorie malnutrition -encourage p.o. intake and continue supplements.   --Consult dietitian  Hyperlipidemia -continue statin  Hypertension -continue lisinopril.  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  Further complicates lung malignancy and overall prognosis unfortunately.   Patient BMI: There is no height or weight on file to calculate BMI.   DVT prophylaxis: heparin injection 5,000 Units Start: 01/11/21 0600 SCDs Start: 01/08/21 2224   Diet:  Diet Orders (From admission, onward)     Start     Ordered   01/11/21 1533  Diet Heart Room service appropriate? Yes with Assist; Fluid consistency: Thin  Diet effective now       Question Answer Comment  Room service appropriate? Yes with Assist   Fluid consistency: Thin      01/11/21 1532  Code Status: Full Code   Subjective 01/12/21     Patient awake laying in bed with wife and daughter at bedside when seen this morning.  He is having a lot of tailbone pain and requesting repositioning.  Pain overall is better controlled with oral liquid morphine added yesterday.  He is not tolerating pills crushed in applesauce.  Doing better swallowing thin liquids.  Nothing tastes right.     Disposition Plan & Communication   Status is: Inpatient  Remains inpatient appropriate because:  Pain uncontrolled.  DME required at home prior to d/c.  Dispo: The patient is from: Home              Anticipated d/c is to: Home              Patient currently is not medically stable to d/c.   Difficult to place patient No   Family Communication: Wife and daughter at bedside on rounds 9/8, 9/9, 9/10   Consults, Procedures, Significant Events   Consultants:  Interventional radiology Oncology Palliative care  Procedures:  CT-guided right lower lobe lung biopsy 01/11/2019  Antimicrobials:  Anti-infectives (From admission, onward)    Start     Dose/Rate Route Frequency Ordered Stop   01/09/21 2000  cefTRIAXone (ROCEPHIN) 2 g in sodium chloride 0.9 % 100 mL IVPB        2 g 200 mL/hr over 30 Minutes Intravenous Every 24 hours 01/08/21 2224 01/14/21 1959   01/09/21 2000  azithromycin (ZITHROMAX) 500 mg in sodium chloride 0.9 % 250 mL IVPB        500 mg 250 mL/hr over 60 Minutes Intravenous Every 24 hours 01/08/21 2224 01/14/21 1959   01/08/21 1930  cefTRIAXone (ROCEPHIN) 1 g in sodium chloride 0.9 % 100 mL IVPB        1 g 200 mL/hr over 30 Minutes Intravenous  Once 01/08/21 1929 01/08/21 2053   01/08/21 1930  azithromycin (ZITHROMAX) 500 mg in sodium chloride 0.9 % 250 mL IVPB        500 mg 250 mL/hr over 60 Minutes Intravenous  Once 01/08/21 1929 01/08/21 2219         Micro    Objective   Vitals:   01/12/21 0822 01/12/21 1230 01/12/21 1442 01/12/21 1600  BP: (!) 145/77 (!) 150/94  (!) 158/77  Pulse: 77 85 80 84  Resp: 14 20  20 20   Temp: 98.2 F (36.8 C) 97.6 F (36.4 C)  98.2 F (36.8 C)  TempSrc: Oral Axillary  Oral  SpO2: 100% 99% 99% 99%    Intake/Output Summary (Last 24 hours) at 01/12/2021 1633 Last data filed at 01/12/2021 0600 Gross per 24 hour  Intake 914.59 ml  Output 600 ml  Net 314.59 ml   There were no vitals filed for this visit.  Physical Exam:  General exam: awake, alert, no acute distress, underweight Respiratory system: Overall clear, diminished right base, on 3 L/min nasal cannula oxygen, normal respiratory effort Cardiovascular system: normal S1/S2, RRR, no pedal edema.   Gastrointestinal system: soft, nontender nondistended abdomen. Central nervous system: A&O x3. no gross focal neurologic deficits, normal speech but very poor vocal projection Psychiatry: normal mood, congruent affect, judgement and insight appear normal  Labs   Data Reviewed: I have personally reviewed following labs and imaging studies  CBC: Recent Labs  Lab 01/08/21 1532 01/09/21 0522 01/09/21 1850 01/10/21 0126 01/11/21 0509 01/12/21 0114  WBC 22.9* 21.3* 23.3* 19.0* 21.0* 23.5*  NEUTROABS 20.6* 19.0*  --   --   --   --  HGB 11.8* 10.7* 10.5* 9.3* 9.2* 9.4*  HCT 38.0* 35.6* 34.6* 29.8* 30.1* 30.7*  MCV 87.2 88.3 87.8 87.4 88.5 87.2  PLT 563* 535* 529* 407* 423* 742*   Basic Metabolic Panel: Recent Labs  Lab 01/08/21 1532 01/09/21 0522 01/10/21 0126 01/11/21 0509 01/12/21 0114  NA 136 143 137 140 141  K 3.7 3.6 3.3* 3.1* 3.2*  CL 101 107 105 104 108  CO2 28 28 26 25 24   GLUCOSE 93 85 81 74 70  BUN 28* 24* 17 15 12   CREATININE 0.73 0.68 0.70 0.71 0.62  CALCIUM 10.7* 10.6* 10.4* 10.1 9.0  MG  --   --  1.9 1.9 1.8   GFR: CrCl cannot be calculated (Unknown ideal weight.). Liver Function Tests: Recent Labs  Lab 01/08/21 1532 01/09/21 0522 01/10/21 0126 01/11/21 0509  AST 19 15 14* 13*  ALT 22 17 15 15   ALKPHOS 175* 157* 124 118  BILITOT 1.3* 1.0 1.2 1.5*  PROT 5.7* 5.3* 4.7*  4.7*  ALBUMIN 2.2* 2.0* 1.6* 1.6*   Recent Labs  Lab 01/08/21 1532  LIPASE 24   No results for input(s): AMMONIA in the last 168 hours. Coagulation Profile: Recent Labs  Lab 01/10/21 0804  INR 1.5*   Cardiac Enzymes: No results for input(s): CKTOTAL, CKMB, CKMBINDEX, TROPONINI in the last 168 hours. BNP (last 3 results) No results for input(s): PROBNP in the last 8760 hours. HbA1C: No results for input(s): HGBA1C in the last 72 hours. CBG: No results for input(s): GLUCAP in the last 168 hours. Lipid Profile: No results for input(s): CHOL, HDL, LDLCALC, TRIG, CHOLHDL, LDLDIRECT in the last 72 hours. Thyroid Function Tests: No results for input(s): TSH, T4TOTAL, FREET4, T3FREE, THYROIDAB in the last 72 hours.  Anemia Panel: No results for input(s): VITAMINB12, FOLATE, FERRITIN, TIBC, IRON, RETICCTPCT in the last 72 hours. Sepsis Labs: Recent Labs  Lab 01/09/21 0857  PROCALCITON 0.30    Recent Results (from the past 240 hour(s))  SARS CORONAVIRUS 2 (TAT 6-24 HRS) Nasopharyngeal Nasopharyngeal Swab     Status: None   Collection Time: 01/08/21  9:27 PM   Specimen: Nasopharyngeal Swab  Result Value Ref Range Status   SARS Coronavirus 2 NEGATIVE NEGATIVE Final    Comment: (NOTE) SARS-CoV-2 target nucleic acids are NOT DETECTED.  The SARS-CoV-2 RNA is generally detectable in upper and lower respiratory specimens during the acute phase of infection. Negative results do not preclude SARS-CoV-2 infection, do not rule out co-infections with other pathogens, and should not be used as the sole basis for treatment or other patient management decisions. Negative results must be combined with clinical observations, patient history, and epidemiological information. The expected result is Negative.  Fact Sheet for Patients: SugarRoll.be  Fact Sheet for Healthcare Providers: https://www.woods-mathews.com/  This test is not yet approved  or cleared by the [place] FDA and  has been authorized for detection and/or diagnosis of SARS-CoV-2 by FDA under an Emergency Use Authorization (EUA). This EUA will remain  in effect (meaning this test can be used) for the duration of the COVID-19 declaration under Se ction 564(b)(1) of the Act, 21 U.S.C. section 360bbb-3(b)(1), unless the authorization is terminated or revoked sooner.  Performed at [facility], [place] [address], [place], [place] [address]       Imaging Studies   MR BRAIN W WO CONTRAST  Result Date: 01/12/2021 CLINICAL DATA:  Initial evaluation for metastatic disease. EXAM: MRI HEAD WITHOUT AND WITH CONTRAST TECHNIQUE: Multiplanar, multiecho pulse  sequences of the brain and surrounding structures were obtained without and with intravenous contrast. CONTRAST:  44mL GADAVIST GADOBUTROL 1 MMOL/ML IV SOLN COMPARISON:  None available. FINDINGS: Brain: Examination mildly degraded by motion artifact. Generalized age-related cerebral atrophy. Patchy T2/FLAIR hyperintensity within the periventricular deep white matter both cerebral hemispheres most consistent with chronic small vessel ischemic disease, mild to moderate in nature. No abnormal foci of restricted diffusion to suggest acute or subacute ischemia. Gray-white matter differentiation maintained. No encephalomalacia to suggest chronic cortical infarction. No evidence for acute or chronic intracranial hemorrhage. No mass lesion, midline shift or mass effect. No hydrocephalus or extra-axial fluid collection. Pituitary gland suprasellar region normal. Midline structures intact. No abnormal enhancement. No evidence for intracranial metastatic disease. Vascular: Major intracranial vascular flow voids are maintained. Skull and upper cervical spine: Craniocervical junction within normal limits. Bone marrow signal intensity normal. No visible focal marrow replacing lesion. No scalp soft tissue abnormality. Sinuses/Orbits: Patient  status post bilateral ocular lens replacement. Globes and orbital soft tissues demonstrate no other acute finding. Paranasal sinuses are largely clear. Small left mastoid effusion, of doubtful significance. Inner ear structures grossly normal. Other: None. IMPRESSION: 1. No evidence for intracranial metastatic disease. 2. Age-related cerebral atrophy with mild to moderate chronic microvascular ischemic disease. Electronically Signed   By: Jeannine Boga M.D.   On: 01/12/2021 01:31   MR THORACIC SPINE W WO CONTRAST  Result Date: 01/11/2021 CLINICAL DATA:  Initial evaluation for metastatic workup. EXAM: MRI THORACIC WITHOUT AND WITH CONTRAST TECHNIQUE: Multiplanar and multiecho pulse sequences of the thoracic spine were obtained without and with intravenous contrast. CONTRAST:  46mL GADAVIST GADOBUTROL 1 MMOL/ML IV SOLN COMPARISON:  Prior CTs from 01/08/2021. FINDINGS: Alignment: Physiologic with preservation of the normal thoracic kyphosis. No listhesis. Vertebrae: Large metastatic implant involving the left aspect of the T1 and T2 vertebral bodies is seen. Lesion measures approximately 5.1 x 3.2 x 3.7 cm (series 19, image 8). Involvement of the adjacent left posterior T2 and T3 ribs noted. Invasion of the adjacent left T2-3 and T3-4 neural foramina. The left T2-3 neural foramen is largely obliterated, with moderate narrowing at the left T3-4 neural foramen. Mild flattening of the left aspect of the adjacent thecal sac without significant stenosis or additional epidural invasion at this time. No associated pathologic fracture. Additional 1.5 cm osseous metastasis seen at the right anterior aspect of T7 (series 22, image 9). Large metastatic implant involving the right posterior L1 vertebral body as well as the adjacent right posterior elements partially visualized (series 22, image 9). This lesion abuts the right aspect of the thecal sac at the level of T12-L1 (series 18, image 39). No other significant  metastatic disease seen elsewhere within the thoracic spine at this time. Vertebral body height maintained. Underlying bone marrow signal intensity diffusely heterogeneous. No evidence for osteomyelitis discitis or septic arthritis. Cord: Normal signal and morphology. Other than the above described lesions at T2-3/T3-4 and L1 abutting the thecal sac, no other discernible epidural involvement. No other abnormal enhancement. Paraspinal and other soft tissues: Large necrotic mass positioned at the posterior right lung base again seen. Direct extension through the right hemidiaphragm into the adjacent liver noted. Associated irregular right pleural effusion. Right hilar and subcarinal adenopathy. Additional trace left pleural effusion. Findings better evaluated on prior CT. Probable cholelithiasis noted as well. Disc levels: Tiny right paracentral disc protrusion at T7-8 minimally flattens the right ventral thecal sac without significant stenosis. Otherwise, no other significant disc pathology seen within the  thoracic spine. No spinal stenosis. Mild bilateral foraminal narrowing noted at T10-11 due to facet disease. IMPRESSION: 1. Large metastatic implant involving the left aspect of the T1 and T2 vertebral bodies with invasion of the adjacent left T2-3 and T3-4 neural foramina. 2. Additional 1.5 cm metastatic implant involving the right anterior aspect of T7. 3. Large metastatic implant involving the right posterior L1 vertebral body and adjacent posterior elements, partially visualized. 4. No other significant metastatic involvement within the thoracic spine at this time. No other epidural involvement. No pathologic fracture. 5. Large necrotic mass at the posterior right lung base with invasion of the adjacent right hemidiaphragm and liver, with associated right pleural effusion and mediastinal adenopathy. Findings better evaluated on prior CT. Electronically Signed   By: Jeannine Boga M.D.   On: 01/11/2021  04:15     Medications   Scheduled Meds:  guaiFENesin-dextromethorphan  5 mL Oral Q6H   heparin  5,000 Units Subcutaneous Q8H   lactose free nutrition  237 mL Oral TID WC   lisinopril  2.5 mg Oral QPM   mouth rinse  15 mL Mouth Rinse BID   multivitamin with minerals  1 tablet Oral Daily   rosuvastatin  5 mg Oral QPM   Continuous Infusions:  sodium chloride 100 mL/hr at 01/12/21 0104   azithromycin 500 mg (01/11/21 2132)   cefTRIAXone (ROCEPHIN)  IV 2 g (01/11/21 2018)   methocarbamol (ROBAXIN) IV     potassium chloride 10 mEq (01/12/21 1621)       LOS: 3 days    Time spent: 35 minutes with greater than 50% spent at bedside and coordinating care    Ezekiel Slocumb, DO [facility]  01/12/2021, 4:33 PM      If 7PM-7AM, please contact night-coverage. How to contact the [facility] Attending or Consulting provider [place] or covering provider during after hours [place], for this patient?    Check the care team in [facility] and look for a) attending/consulting [facility] provider listed and b) the [facility] team listed Log into [email or web address] and use [place]'s universal password to access. If you do not have the password, please contact the hospital operator. Locate the [facility] provider you are looking for under [facility] and page to a number that you can be directly reached. If you still have difficulty reaching the provider, please page the [facility] (Director on Call) for the Hospitalists listed on amion for assistance.

## 2021-01-12 NOTE — Progress Notes (Signed)
   Daily Progress Note   Patient Name: Cody Carr       Date: 01/12/2021 DOB: 03-27-1946  Age: 75 y.o. MRN#: [id number] Attending Physician: Ezekiel Slocumb, DO Primary Care Physician: Monico Blitz, MD Admit Date: 01/08/2021  Reason for Consultation/Follow-up: Establishing goals of care  Subjective: Chart Reviewed. Updates Received. Patient Assessed.   Patient awake, alert and oriented in bed. Complains of some shoulder and back pain. Recently received PRN morphine. Denies shortness of breath. Assisted with repositioning in bed. Wife and daughter are at the bedside. Dr. Arbutus Ped at the bedside providing updates and support.   Discussed at length advanced directives and pain management. Morphine has been adjusted today. Family agrees with the plan to re-evaluate in 24hrs and make adjustments. We discussed home pain management and need for more long-term pain regimen for comfort. Education provided on fentanyl patch given patient is not tolerating pills well. His wife verbalized understanding sharing she is a retired Youth worker.   We also discussed patient's poor nutrition. He states he would like to order lunch and attempt to eat some broth and jello.   Patient does not wish to discuss code status or advanced directives at this time. Request for ongoing palliative support and discussions daily.   All questions answered and support provided.    Length of Stay: 3 days  Vital Signs: BP (!) 150/94 (BP Location: Left Arm)   Pulse 85   Temp 97.6 F (36.4 C) (Axillary)   Resp 20   SpO2 99%  SpO2: SpO2: 99 % O2 Device: O2 Device: Nasal Cannula O2 Flow Rate: O2 Flow Rate (L/min): 3 L/min  Physical Exam: NAD, ill-appearing RRR Diminished, 3@L  AAO x3, mood appropriate                Palliative Care Assessment & Plan   Code Status: Full code  Goals of Care/Recommendations: Continue with current plan of care. Treat the treatable. Pending further Oncology/path results.   Roxanol increased to 10 mg every 2hr PRN. In agreement with plans to evaluate total amount required over the next 24 hrs. Will then plan to initiate fentanyl patch (does not tolerate pills) based on need. Education has been provided to family and patient.  Detailed discussion regarding current illness, nutrition, code status and wishes. Family requesting ongoing support and discussions. Patient not at a good place today to further discuss wishes. Request evaluation tomorrow and approach topics (Code, artificial nutrition etc.) Does not wish to be overwhelmed and open to address one topic at a time. Patient and family has MOST form and advanced directive and are reviewing amongst selves.  PMT will continue to support and follow.   Prognosis: Concern for poor prognosis with metastatic disease process undergoing evaluation and declining nutritional status.   Discharge Planning: To Be Determined  Thank you for allowing the Palliative Medicine Team to assist in the care of this patient.  Time Total: 40 min.   Visit consisted of counseling and education dealing with the complex and emotionally intense issues of symptom management and palliative care in the setting of serious and potentially life-threatening illness.Greater than 50%  of this time was spent counseling and coordinating care related to the above assessment and plan.  Alda Lea, AGPCNP-BC  Palliative Medicine Team [phone]

## 2021-01-13 DIAGNOSIS — G893 Neoplasm related pain (acute) (chronic): Secondary | ICD-10-CM

## 2021-01-13 DIAGNOSIS — Z515 Encounter for palliative care: Secondary | ICD-10-CM | POA: Diagnosis not present

## 2021-01-13 DIAGNOSIS — Z7189 Other specified counseling: Secondary | ICD-10-CM | POA: Diagnosis not present

## 2021-01-13 DIAGNOSIS — C3491 Malignant neoplasm of unspecified part of right bronchus or lung: Secondary | ICD-10-CM | POA: Diagnosis not present

## 2021-01-13 LAB — COMPREHENSIVE METABOLIC PANEL
ALT: 17 U/L (ref 0–44)
AST: 17 U/L (ref 15–41)
Albumin: 1.5 g/dL — ABNORMAL LOW (ref 3.5–5.0)
Alkaline Phosphatase: 120 U/L (ref 38–126)
Anion gap: 8 (ref 5–15)
BUN: 9 mg/dL (ref 8–23)
CO2: 25 mmol/L (ref 22–32)
Calcium: 8.3 mg/dL — ABNORMAL LOW (ref 8.9–10.3)
Chloride: 106 mmol/L (ref 98–111)
Creatinine, Ser: 0.58 mg/dL — ABNORMAL LOW (ref 0.61–1.24)
GFR, Estimated: >60 mL/min (ref >60)
Glucose, Bld: 88 mg/dL (ref 70–99)
Potassium: 3.5 mmol/L (ref 3.5–5.1)
Sodium: 139 mmol/L (ref 135–145)
Total Bilirubin: 1.2 mg/dL (ref 0.3–1.2)
Total Protein: 4.5 g/dL — ABNORMAL LOW (ref 6.5–8.1)

## 2021-01-13 LAB — CBC
HCT: 29.4 % — ABNORMAL LOW (ref 39.0–52.0)
Hemoglobin: 9 g/dL — ABNORMAL LOW (ref 13.0–17.0)
MCH: 26.9 pg (ref 26.0–34.0)
MCHC: 30.6 g/dL (ref 30.0–36.0)
MCV: 88 fL (ref 80.0–100.0)
Platelets: 386 10*3/uL (ref 150–400)
RBC: 3.34 MIL/uL — ABNORMAL LOW (ref 4.22–5.81)
RDW: 16 % — ABNORMAL HIGH (ref 11.5–15.5)
WBC: 23.1 10*3/uL — ABNORMAL HIGH (ref 4.0–10.5)
nRBC: 0 % (ref 0.0–0.2)

## 2021-01-13 LAB — MAGNESIUM: Magnesium: 1.8 mg/dL (ref 1.7–2.4)

## 2021-01-13 LAB — VITAMIN D 25 HYDROXY (VIT D DEFICIENCY, FRACTURES): Vit D, 25-Hydroxy: 55.3 ng/mL (ref 30–100)

## 2021-01-13 MED ORDER — LISINOPRIL 5 MG PO TABS
5.0000 mg | ORAL_TABLET | Freq: Every evening | ORAL | Status: DC
  Filled 2021-01-13: qty 1

## 2021-01-13 MED ORDER — POTASSIUM CHLORIDE IN NACL 40-0.9 MEQ/L-% IV SOLN
INTRAVENOUS | Status: DC
  Filled 2021-01-13 (×2): qty 1000

## 2021-01-13 NOTE — Progress Notes (Signed)
Patient ID: Sharlette Dense, male   DOB: 1946-02-28, 75 y.o.   MRN: [id number]    Progress Note from the Palliative Medicine Team at [facility]   Patient Name: Cody Carr        Date: 01/13/2021 DOB: 05-Mar-1946  Age: 75 y.o. MRN#: [id number] Attending Physician: Ezekiel Slocumb, DO Primary Care Physician: Monico Blitz, MD Admit Date: 01/08/2021   Medical records reviewed, discussed with team and assessed patient.  75 y.o. male  with past medical history of throat cancer s/p chemo/radiation, prostate cancer s/p seeds, nephrolithiasis, hypertension, hyperlipidemia admitted on 01/08/2021 with constipation, back pain, poor po intake and concern for underlying lung cancer with concern for multiple sites of metastatic disease.   Patient is awake and alert.  Currently he has no complaints and verbalizes that his pain is controlled "pretty good"  Daughter [name] at bedside.  Continued education regarding current pain management strategy specific to anticipate shunt of discharge home.     Plan of care -Full code       -Education offered to  patient/family to consideration of  DNR/DNI status understanding evidenced based poor outcomes in similar hospitalized patient, as the cause of arrest is likely associated with advanced chronic illness rather than an easily reversible acute cardio-pulmonary event.  -Continue current treatment plan.  Treat the treatable, patient and family await further information from biopsy report. -Education offered to patient regarding his pain management.  Patient now understands that he pain medication available to him all he needs to do is ask.  Education offered to the concept of not allowing pain to get out of control before asking for his medication.           (According to his daughter he did not know that he had to ask for pain medication,  they thought that it was scheduled and therefore daughter is feeling as if he did not receive adequate dosing of medication  for his pain control)     At this time it seems that patient's pain may be well controlled on as needed Roxanol ,   No  adjustment to pain regimen today, will reevaluate in the morning for 24-hour opioid need.  May consider fentanyl patch at that time if indicated.  -Education offered today on importance of advance care planning.  We reviewed advance care planning booklet, and MOST form.  Questions and concerns addressed  -Patient plans to continue with Dr. Delton Coombes for his oncology care -Discharge home when medically stable  Patient and family will need continued support in navigating healthcare decisions specifically to patient's concern for metastatic disease, continued physical and functional decline and overall failure to thrive.   Discussed with patient the importance of continued conversation with family and their  medical providers regarding overall plan of care and treatment options,  ensuring decisions are within the context of the patients values and GOCs.  Questions and concerns addressed     Total time spent on the unit was 35 minutes  Greater than 50% of the time was spent in counseling and coordination of care  Wadie Lessen NP  Palliative Medicine Team Team Phone # [phone] Pager [phone]

## 2021-01-13 NOTE — Progress Notes (Signed)
PROGRESS NOTE    Cody Carr   FAO:[id number]  DOB: 1946-03-05  PCP: Monico Blitz, MD    DOA: 01/08/2021 LOS: 4    Brief Narrative / Hospital Course to Date:   75 y.o. male, with history of nephrolithiasis, hyperlipidemia, hypertension, prostate cancer with seeds, throat cancer status post chemoradiation per patient, and more presented the [facility] ED with a chief complaint of back pain, constipation, and poor p.o. intake.  CT showed right-sided pleural effusion, large necrotic appearing mass in the posterior right lower lobe appearing to cross the right diaphragm extending into adjacent liver.  Imaging also revealed mediastinal lymphadenopathy, lytic lesions in the lumbar spine concerning for metastatic disease enhancing soft tissues mass in the left gluteal muscle, bilateral renal lesions.  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  Patient was seen by oncology.  Transferred to [facility] for IR to obtain tissue for biopsy.   Assessment & Plan   Principal Problem:   Non-small cell carcinoma of lung, stage 4, right (HCC) Active Problems:   Acute respiratory failure with hypoxia ([place])   CAP (community acquired pneumonia)   Hypercalcemia   Malignant pleural effusion   Constipation   Protein-calorie malnutrition, moderate (HCC)   Pressure injury of skin   Acute respiratory failure with hypoxia secondary to postobstructive pneumonia versus atelectasis 9/11: Patient continues to require 3 L/min nasal cannula oxygen and has productive cough. --Supplement O2 to maintain sats above 90%, wean as tolerated --Continue empiric antibiotics  --Pulmonary hygiene with I-S and flutter --Bronchodilators as needed --Home oxygen qualification pending  Stage IV non-small cell lung cancer, newly diagnosed -primary right lower lobe lung mass with metastatic involvement of multiple lumbar and thoracic vertebrae, lumbar paraspinal soft tissue, left gluteus muscle,  primary mass extension across diaphragm into liver, possibly spleen and kidneys.  No brain metastases seen on MRI. IR performed CT-guided lung biopsy 9/8. Pathology shows non-small cell carcinoma. --Oncology consulted, patient seen by Dr. Delton Coombes at [facility] --Palliative care following, appreciate assistance --Pain control - ongoing titration of medications for adequate relief --Review MME's tomorrow and consider Fentanyl patch --Bowel regimen  Malignant hypercalcemia -Resolved.  Most likely secondary to skeletal lytic lesions.   Treated with Zometa on 9/7 and aggressive IV hydration. --Reduce NS to maintenance rate  Right pleural effusion, small - most likely malignant.  Further evaluation per oncology.  Will consider thoracentesis if difficulty weaning off O2.  Community-acquired pneumonia vs postobstructive pneumonia/atelectasis -continue empiric Rocephin and Zithromax.  Mucinex.  Pulmonary hygiene.  Follow-up procalcitonin trend.  Constipation -likely opioid induced. --Relistor given 9/10, can repeat 9/12 if no result --other laxative on hold for Relistor --suppository or enemas if needed  Moderate protein calorie malnutrition -encourage p.o. intake and continue supplements.   --Consult dietitian  Hyperlipidemia -continue statin  Hypertension -continue lisinopril.  As needed hydralazine for SBP> 160.  9/11 increased lisinopril 2.5 >> 5 mg due to elevated BP's (may improve once pain better controlled). Daughter reports hx of pt's BP dropping too low with lisinopril in the past.  Monitor closely.  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  Further complicates lung malignancy and overall prognosis unfortunately.   Patient BMI: Body mass index is 24.17 kg/m.   DVT prophylaxis: heparin injection 5,000 Units Start: 01/11/21 0600 SCDs Start: 01/08/21 2224   Diet:  Diet Orders (From admission,  onward)     Start  Ordered   01/11/21 1533  Diet Heart Room service appropriate? Yes with Assist; Fluid consistency: Thin  Diet effective now       Question Answer Comment  Room service appropriate? Yes with Assist   Fluid consistency: Thin      01/11/21 1532              Code Status: Full Code   Subjective 01/13/21    Patient seen with daughter Cody Carr at bedside today.  Pt reports that overall his pain is better controlled, but it does spike to be severe before next dose of morphine given.  Concern for pain he'll have with BM and clean up.  Does not want to use a bedpan due to pain that would cause.  Overall pt reports feeling okay.   Disposition Plan & Communication   Status is: Inpatient  Remains inpatient appropriate because:  Pain uncontrolled, regimen being adjusted.    Dispo: The patient is from: Home              Anticipated d/c is to: Home              Patient currently is not medically stable to d/c.   Difficult to place patient No   Family Communication: Wife and daughter at bedside on rounds 9/8, 9/9, 9/10, 9/11 (daughter)   Consults, Procedures, Significant Events   Consultants:  Interventional radiology Oncology Palliative care  Procedures:  CT-guided right lower lobe lung biopsy 01/11/2019  Antimicrobials:  Anti-infectives (From admission, onward)    Start     Dose/Rate Route Frequency Ordered Stop   01/09/21 2000  cefTRIAXone (ROCEPHIN) 2 g in sodium chloride 0.9 % 100 mL IVPB        2 g 200 mL/hr over 30 Minutes Intravenous Every 24 hours 01/08/21 2224 01/13/21 2359   01/09/21 2000  azithromycin (ZITHROMAX) 500 mg in sodium chloride 0.9 % 250 mL IVPB        500 mg 250 mL/hr over 60 Minutes Intravenous Every 24 hours 01/08/21 2224 01/13/21 2359   01/08/21 1930  cefTRIAXone (ROCEPHIN) 1 g in sodium chloride 0.9 % 100 mL IVPB        1 g 200 mL/hr over 30 Minutes Intravenous  Once 01/08/21 1929 01/08/21 2053   01/08/21 1930  azithromycin  (ZITHROMAX) 500 mg in sodium chloride 0.9 % 250 mL IVPB        500 mg 250 mL/hr over 60 Minutes Intravenous  Once 01/08/21 1929 01/08/21 2219         Micro    Objective   Vitals:   01/13/21 0425 01/13/21 0816 01/13/21 0923 01/13/21 1135  BP: (!) 153/81 (!) 173/96  (!) 160/82  Pulse: 78 85 85 84  Resp: 20 14 17 18   Temp: 98.2 F (36.8 C) 98.4 F (36.9 C)  98.2 F (36.8 C)  TempSrc: Oral Oral  Oral  SpO2: 97% 98% 100% 97%  Weight: 83.1 kg       Intake/Output Summary (Last 24 hours) at 01/13/2021 1457 Last data filed at 01/13/2021 1317 Gross per 24 hour  Intake 1801.63 ml  Output 1850 ml  Net -48.37 ml   Filed Weights   01/13/21 0425  Weight: 83.1 kg    Physical Exam:  General exam: awake, alert, no acute distress, underweight Respiratory system: CTAB, diminished right base, on 3 L/min nasal cannula oxygen, normal respiratory effort, occasional productive sounding cough Cardiovascular system: normal S1/S2, RRR, no pedal edema.   Central nervous system:  A&O x3. no gross focal neurologic deficits, normal speech but very poor vocal projection Psychiatry: normal mood, congruent affect, judgement and insight appear normal  Labs   Data Reviewed: I have personally reviewed following labs and imaging studies  CBC: Recent Labs  Lab 01/08/21 1532 01/09/21 0522 01/09/21 1850 01/10/21 0126 01/11/21 0509 01/12/21 0114 01/13/21 0047  WBC 22.9* 21.3* 23.3* 19.0* 21.0* 23.5* 23.1*  NEUTROABS 20.6* 19.0*  --   --   --   --   --   HGB 11.8* 10.7* 10.5* 9.3* 9.2* 9.4* 9.0*  HCT 38.0* 35.6* 34.6* 29.8* 30.1* 30.7* 29.4*  MCV 87.2 88.3 87.8 87.4 88.5 87.2 88.0  PLT 563* 535* 529* 407* 423* 456* 951   Basic Metabolic Panel: Recent Labs  Lab 01/09/21 0522 01/10/21 0126 01/11/21 0509 01/12/21 0114 01/13/21 0047  NA 143 137 140 141 139  K 3.6 3.3* 3.1* 3.2* 3.5  CL 107 105 104 108 106  CO2 28 26 25 24 25   GLUCOSE 85 81 74 70 88  BUN 24* 17 15 12 9   CREATININE 0.68  0.70 0.71 0.62 0.58*  CALCIUM 10.6* 10.4* 10.1 9.0 8.3*  MG  --  1.9 1.9 1.8 1.8   GFR: Estimated Creatinine Clearance: 90.2 mL/min (A) (by C-G formula based on SCr of 0.58 mg/dL (L)). Liver Function Tests: Recent Labs  Lab 01/08/21 1532 01/09/21 0522 01/10/21 0126 01/11/21 0509 01/13/21 0047  AST 19 15 14* 13* 17  ALT 22 17 15 15 17   ALKPHOS 175* 157* 124 118 120  BILITOT 1.3* 1.0 1.2 1.5* 1.2  PROT 5.7* 5.3* 4.7* 4.7* 4.5*  ALBUMIN 2.2* 2.0* 1.6* 1.6* 1.5*   Recent Labs  Lab 01/08/21 1532  LIPASE 24   No results for input(s): AMMONIA in the last 168 hours. Coagulation Profile: Recent Labs  Lab 01/10/21 0804  INR 1.5*   Cardiac Enzymes: No results for input(s): CKTOTAL, CKMB, CKMBINDEX, TROPONINI in the last 168 hours. BNP (last 3 results) No results for input(s): PROBNP in the last 8760 hours. HbA1C: No results for input(s): HGBA1C in the last 72 hours. CBG: No results for input(s): GLUCAP in the last 168 hours. Lipid Profile: No results for input(s): CHOL, HDL, LDLCALC, TRIG, CHOLHDL, LDLDIRECT in the last 72 hours. Thyroid Function Tests: No results for input(s): TSH, T4TOTAL, FREET4, T3FREE, THYROIDAB in the last 72 hours.  Anemia Panel: No results for input(s): VITAMINB12, FOLATE, FERRITIN, TIBC, IRON, RETICCTPCT in the last 72 hours. Sepsis Labs: Recent Labs  Lab 01/09/21 0857  PROCALCITON 0.30    Recent Results (from the past 240 hour(s))  SARS CORONAVIRUS 2 (TAT 6-24 HRS) Nasopharyngeal Nasopharyngeal Swab     Status: None   Collection Time: 01/08/21  9:27 PM   Specimen: Nasopharyngeal Swab  Result Value Ref Range Status   SARS Coronavirus 2 NEGATIVE NEGATIVE Final    Comment: (NOTE) SARS-CoV-2 target nucleic acids are NOT DETECTED.  The SARS-CoV-2 RNA is generally detectable in upper and lower respiratory specimens during the acute phase of infection. Negative results do not preclude SARS-CoV-2 infection, do not rule out co-infections with  other pathogens, and should not be used as the sole basis for treatment or other patient management decisions. Negative results must be combined with clinical observations, patient history, and epidemiological information. The expected result is Negative.  Fact Sheet for Patients: SugarRoll.be  Fact Sheet for Healthcare Providers: https://www.woods-mathews.com/  This test is not yet approved or cleared by the [place] FDA and  has  been authorized for detection and/or diagnosis of SARS-CoV-2 by FDA under an Emergency Use Authorization (EUA). This EUA will remain  in effect (meaning this test can be used) for the duration of the COVID-19 declaration under Se ction 564(b)(1) of the Act, 21 U.S.C. section 360bbb-3(b)(1), unless the authorization is terminated or revoked sooner.  Performed at [facility], [place] [address], [place], [place] [address]       Imaging Studies   MR BRAIN W WO CONTRAST  Result Date: 01/12/2021 CLINICAL DATA:  Initial evaluation for metastatic disease. EXAM: MRI HEAD WITHOUT AND WITH CONTRAST TECHNIQUE: Multiplanar, multiecho pulse sequences of the brain and surrounding structures were obtained without and with intravenous contrast. CONTRAST:  43mL GADAVIST GADOBUTROL 1 MMOL/ML IV SOLN COMPARISON:  None available. FINDINGS: Brain: Examination mildly degraded by motion artifact. Generalized age-related cerebral atrophy. Patchy T2/FLAIR hyperintensity within the periventricular deep white matter both cerebral hemispheres most consistent with chronic small vessel ischemic disease, mild to moderate in nature. No abnormal foci of restricted diffusion to suggest acute or subacute ischemia. Gray-white matter differentiation maintained. No encephalomalacia to suggest chronic cortical infarction. No evidence for acute or chronic intracranial hemorrhage. No mass lesion, midline shift or mass effect. No hydrocephalus or  extra-axial fluid collection. Pituitary gland suprasellar region normal. Midline structures intact. No abnormal enhancement. No evidence for intracranial metastatic disease. Vascular: Major intracranial vascular flow voids are maintained. Skull and upper cervical spine: Craniocervical junction within normal limits. Bone marrow signal intensity normal. No visible focal marrow replacing lesion. No scalp soft tissue abnormality. Sinuses/Orbits: Patient status post bilateral ocular lens replacement. Globes and orbital soft tissues demonstrate no other acute finding. Paranasal sinuses are largely clear. Small left mastoid effusion, of doubtful significance. Inner ear structures grossly normal. Other: None. IMPRESSION: 1. No evidence for intracranial metastatic disease. 2. Age-related cerebral atrophy with mild to moderate chronic microvascular ischemic disease. Electronically Signed   By: Jeannine Boga M.D.   On: 01/12/2021 01:31     Medications   Scheduled Meds:  guaiFENesin-dextromethorphan  5 mL Oral Q6H   heparin  5,000 Units Subcutaneous Q8H   lactose free nutrition  237 mL Oral TID WC   lisinopril  5 mg Oral QPM   mouth rinse  15 mL Mouth Rinse BID   multivitamin with minerals  1 tablet Oral Daily   rosuvastatin  5 mg Oral QPM   Continuous Infusions:  sodium chloride 100 mL/hr at 01/13/21 0112   azithromycin 500 mg (01/12/21 2012)   cefTRIAXone (ROCEPHIN)  IV 2 g (01/12/21 1928)   methocarbamol (ROBAXIN) IV         LOS: 4 days    Time spent: 30 minutes with greater than 50% spent at bedside and coordinating care    Ezekiel Slocumb, DO [facility]  01/13/2021, 2:57 PM      If 7PM-7AM, please contact night-coverage. How to contact the [facility] Attending or Consulting provider [place] or covering provider during after hours [place], for this patient?    Check the care team in [facility] and look for a) attending/consulting [facility] provider listed and b) the [facility] team listed Log  into [email or web address] and use [place]'s universal password to access. If you do not have the password, please contact the hospital operator. Locate the [facility] provider you are looking for under [facility] and page to a number that you can be directly reached. If you still have difficulty reaching the provider, please page the [facility] (Director  on Call) for the Hospitalists listed on amion for assistance.

## 2021-01-13 NOTE — Progress Notes (Signed)
Room air trial attempted  Patient O2 100% on 3 L [place]. Patient 93% on room air.

## 2021-01-14 DIAGNOSIS — G893 Neoplasm related pain (acute) (chronic): Secondary | ICD-10-CM | POA: Diagnosis not present

## 2021-01-14 DIAGNOSIS — E43 Unspecified severe protein-calorie malnutrition: Secondary | ICD-10-CM | POA: Insufficient documentation

## 2021-01-14 DIAGNOSIS — Z7189 Other specified counseling: Secondary | ICD-10-CM | POA: Diagnosis not present

## 2021-01-14 DIAGNOSIS — C3491 Malignant neoplasm of unspecified part of right bronchus or lung: Secondary | ICD-10-CM | POA: Diagnosis not present

## 2021-01-14 LAB — SURGICAL PATHOLOGY

## 2021-01-14 LAB — CBC
HCT: 28 % — ABNORMAL LOW (ref 39.0–52.0)
Hemoglobin: 8.6 g/dL — ABNORMAL LOW (ref 13.0–17.0)
MCH: 27.1 pg (ref 26.0–34.0)
MCHC: 30.7 g/dL (ref 30.0–36.0)
MCV: 88.3 fL (ref 80.0–100.0)
Platelets: 349 10*3/uL (ref 150–400)
RBC: 3.17 MIL/uL — ABNORMAL LOW (ref 4.22–5.81)
RDW: 16.1 % — ABNORMAL HIGH (ref 11.5–15.5)
WBC: 22.7 10*3/uL — ABNORMAL HIGH (ref 4.0–10.5)
nRBC: 0 % (ref 0.0–0.2)

## 2021-01-14 LAB — MAGNESIUM: Magnesium: 1.8 mg/dL (ref 1.7–2.4)

## 2021-01-14 LAB — BASIC METABOLIC PANEL
Anion gap: 7 (ref 5–15)
BUN: 7 mg/dL — ABNORMAL LOW (ref 8–23)
CO2: 26 mmol/L (ref 22–32)
Calcium: 8.1 mg/dL — ABNORMAL LOW (ref 8.9–10.3)
Chloride: 105 mmol/L (ref 98–111)
Creatinine, Ser: 0.57 mg/dL — ABNORMAL LOW (ref 0.61–1.24)
GFR, Estimated: >60 mL/min (ref >60)
Glucose, Bld: 93 mg/dL (ref 70–99)
Potassium: 3.8 mmol/L (ref 3.5–5.1)
Sodium: 138 mmol/L (ref 135–145)

## 2021-01-14 MED ORDER — ADULT MULTIVITAMIN W/MINERALS CH
1.0000 | ORAL_TABLET | Freq: Every day | ORAL | Status: DC
  Administered 2021-01-17: 1 via ORAL
  Filled 2021-01-14 (×3): qty 1

## 2021-01-14 MED ORDER — PROSOURCE PLUS PO LIQD
30.0000 mL | Freq: Two times a day (BID) | ORAL | Status: DC
  Administered 2021-01-15 – 2021-01-20 (×10): 30 mL via ORAL
  Filled 2021-01-14 (×9): qty 30

## 2021-01-14 MED ORDER — DEXAMETHASONE SODIUM PHOSPHATE 10 MG/ML IJ SOLN
8.0000 mg | Freq: Three times a day (TID) | INTRAMUSCULAR | Status: DC
  Administered 2021-01-14 – 2021-01-24 (×28): 8 mg via INTRAVENOUS
  Filled 2021-01-14: qty 1
  Filled 2021-01-14 (×4): qty 0.8
  Filled 2021-01-14 (×12): qty 1
  Filled 2021-01-14 (×2): qty 0.8
  Filled 2021-01-14 (×2): qty 1
  Filled 2021-01-14: qty 0.8
  Filled 2021-01-14 (×6): qty 1
  Filled 2021-01-14: qty 0.8

## 2021-01-14 MED ORDER — BOOST / RESOURCE BREEZE PO LIQD CUSTOM
1.0000 | Freq: Three times a day (TID) | ORAL | Status: DC
  Administered 2021-01-15 – 2021-01-19 (×10): 1 via ORAL

## 2021-01-14 MED ORDER — LISINOPRIL 10 MG PO TABS
10.0000 mg | ORAL_TABLET | Freq: Every evening | ORAL | Status: DC
  Administered 2021-01-14 – 2021-01-19 (×6): 10 mg via ORAL
  Filled 2021-01-14 (×7): qty 1

## 2021-01-14 MED ORDER — METHYLNALTREXONE BROMIDE 12 MG/0.6ML ~~LOC~~ SOLN
12.0000 mg | Freq: Once | SUBCUTANEOUS | Status: AC
  Administered 2021-01-14: 12 mg via SUBCUTANEOUS
  Filled 2021-01-14: qty 0.6

## 2021-01-14 MED ORDER — DEXAMETHASONE SODIUM PHOSPHATE 10 MG/ML IJ SOLN
8.0000 mg | Freq: Two times a day (BID) | INTRAMUSCULAR | Status: DC
  Filled 2021-01-14 (×2): qty 0.8

## 2021-01-14 NOTE — Evaluation (Signed)
Clinical/Bedside Swallow Evaluation Patient Details  Name: Cody Carr MRN: [id number] Date of Birth: [birth year]  Today's Date: 01/14/2021 Time: SLP Start Time (ACUTE ONLY): 38 SLP Stop Time (ACUTE ONLY): 1020 SLP Time Calculation (min) (ACUTE ONLY): 60 min  Past Medical History:  Past Medical History:  Diagnosis Date   Dyspnea    with exercise   History of kidney stones    Hyperlipemia    Hypertension    Prostate cancer ([place])    Throat cancer ([place]) 2008   treated with Chemo and Radiation   Past Surgical History:  Past Surgical History:  Procedure Laterality Date   dental implants  2008   EXTRACORPOREAL SHOCK WAVE LITHOTRIPSY Right 03/06/2020   Procedure: EXTRACORPOREAL SHOCK WAVE LITHOTRIPSY (ESWL);  Surgeon: Cleon Gustin, MD;  Location: [facility];  Service: Urology;  Laterality: Right;   EYE SURGERY Bilateral 2021   ioc lens for cataracts   RADIOACTIVE SEED IMPLANT N/A 07/27/2020   Procedure: RADIOACTIVE SEED IMPLANT/BRACHYTHERAPY IMPLANT;  Surgeon: Irine Seal, MD;  Location: [facility];  Service: Urology;  Laterality: N/A;  63 SEEDS   SPACE OAR INSTILLATION N/A 07/27/2020   Procedure: SPACE OAR INSTILLATION;  Surgeon: Irine Seal, MD;  Location: [facility];  Service: Urology;  Laterality: N/A;   throat biopsy  2008   HPI:  75yo male admitted 01/08/21 with progressive back pain, constipation, and poor PO intake. PMH: nephrolithiasis, hyperlipidemia, hypertension, prostate cancer with seeds, throat cancer status post chemoradiation, stage 4 lung cancer. CT small R pleural effusion with large necrotic appearing mass in R lower lobe that extends into diaphragm and liver. 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   Assessment / Plan / Recommendation Clinical Impression  Pt seen at bedside for assessment of swallow function and safety. Pt has upper and lower dentures. CN exam unremarkable except for voice quality,  which is at whisper level. Oral cavity is significantly dry. Pt accepted trials of ice chips, thin liquid, and puree. Pt took very small sips of thin liquid, and was noted to exhibit reflexive cough x1 out of multiple presentations. During this evaluation, pt frequently "hocks" secretions up and either spits them out or uses suction to remove them. Tiny boluses of applesauce were accepted, which appeared to be tolerated without overt s/s aspiration.   Pt reports needing to take his time and avoid thick consistencies, as they feel like they are too thick and get stuck. This raises concern for esophageal dysmotility. Pt has a history of H/N cancer s/p radiation (unsure how many treatments). This issue, in addition to dehydration and deconditioning are decreasing pt's ability and willingness to take sufficient PO. Airway protection is suspect, given presentation at bedside. Recommend proceeding with FEES to objectively evaluate pharyngeal swallow and visualize vocal folds. Also recommend consideration of a regular barium swallow when pt can consume a sufficient amount for assessment to be beneficial. In the interim, recommend downgrading diet to full liquid.  SLP Visit Diagnosis: Dysphagia, unspecified (R13.10)    Aspiration Risk  Moderate aspiration risk    Diet Recommendation Other (Comment) (full liquid)   Liquid Administration via: Straw Medication Administration: Crushed with puree Supervision: Patient able to self feed;Full supervision/cueing for compensatory strategies;Staff to assist with self feeding Compensations: Slow rate;Small sips/bites Postural Changes: Seated upright at 90 degrees;Remain upright for at least 30 minutes after po intake    Other  Recommendations Recommended Consults: Consider esophageal assessment;Consider ENT evaluation;Consider GI evaluation Oral Care  Recommendations: Oral care BID   Follow up Recommendations Other (comment) (TBD)      Frequency and Duration min 1  x/week  1 week;2 weeks       Prognosis Prognosis for Safe Diet Advancement: Fair Barriers to Reach Goals: Time post onset;Severity of deficits      Swallow Study   General Date of Onset: 01/08/21 HPI: 75yo male admitted 01/08/21 with progressive back pain, constipation, and poor PO intake. PMH: nephrolithiasis, hyperlipidemia, hypertension, prostate cancer with seeds, throat cancer status post chemoradiation, stage 4 lung cancer. CT small R pleural effusion with large necrotic appearing mass in R lower lobe that extends into diaphragm and liver. 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 Type of Study: Bedside Swallow Evaluation Previous Swallow Assessment: none Diet Prior to this Study: Regular;Thin liquids Temperature Spikes Noted: No Respiratory Status: Room air History of Recent Intubation: No Behavior/Cognition: Alert;Cooperative Oral Cavity Assessment: Dry Oral Care Completed by SLP: Yes Oral Cavity - Dentition: Dentures, top;Dentures, bottom Vision: Functional for self-feeding Self-Feeding Abilities: Able to feed self;Needs set up;Needs assist Patient Positioning: Partially reclined Baseline Vocal Quality: Aphonic Volitional Cough: Weak Volitional Swallow: Able to elicit    Oral/Motor/Sensory Function Overall Oral Motor/Sensory Function: Within functional limits   Ice Chips Ice chips: Impaired Presentation: Spoon Oral Phase Functional Implications: Prolonged oral transit   Thin Liquid Thin Liquid: Impaired Presentation: Straw;Self Fed Oral Phase Functional Implications: Prolonged oral transit Pharyngeal  Phase Impairments: Cough - Immediate Other Comments: Pt took significantly small boluses. Immediate reflexive cough noted x1. Pt has lots of secretions which he continuously "hocks" up.    Nectar Thick Nectar Thick Liquid: Not tested   Honey Thick Honey Thick Liquid: Not tested   Puree Puree: Impaired Oral Phase Functional Implications:  Prolonged oral transit Other Comments: Pt took very small boluses of puree as well, stating he needed to take small amounts or it would get stuck   Solid     Solid: Not tested     Juanluis Guastella B. Quentin Ore, [facility], [place] Speech Language Pathologist Office: [phone]  Shonna Chock 01/14/2021,10:33 AM

## 2021-01-14 NOTE — Progress Notes (Signed)
Occupational Therapy Treatment Patient Details Name: Cody Carr MRN: [id number] DOB: 04/22/1946 Today's Date: 01/14/2021   History of present illness Pt is a 75 y.o. male who presented 01/08/21 to ED with back pain, constipation and poor PO intake. Pt with hx of progressive back pain up to 3 months ago and believed it was a mechanical or nerve injury. Pt with recent hospitalization at [facility] for constipation and dehydration. CT showed small R pleural effusion with large necrotic appearing mass in R lower lobe that extends into diaphragm and liver. 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  PMH: nephrolithiasis, hyperlipidemia, hypertension, prostate cancer with seeds, throat cancer status post chemoradiation.`   OT comments  Pt making very slow progress with all mobility and adls. Pt does seem to have better pain control today and rolled in bed with min assist.  Due to increased ability to roll, therapist suggested attempts at sitting on side of bed since daughter has goals for pt to be able to tolerate transport in a w/c to MD appts.  Both daughter and pt declined attempts to sit on EOB today.  Finished session with education on pressure sores and need to roll pt side to side and need to attempt to balance pain control with mobility.  Will continue education and encouraging pt to do as much mobility as possible with pain control goals being met.     Recommendations for follow up therapy are one component of a multi-disciplinary discharge planning process, led by the attending physician.  Recommendations may be updated based on patient status, additional functional criteria and insurance authorization.    Follow Up Recommendations  Home health OT;Supervision/Assistance - 24 hour    Equipment Recommendations  Wheelchair (measurements OT);Wheelchair cushion (measurements OT);Hospital bed;Other (comment)    Recommendations for Other Services      Precautions /  Restrictions Precautions Precautions: Back;Fall Restrictions Weight Bearing Restrictions: No Other Position/Activity Restrictions: Did not attempt sitting EOB today       Mobility Bed Mobility Overal bed mobility: Needs Assistance Bed Mobility: Rolling Rolling: Min assist         General bed mobility comments: Pt declined supine to sit but rolled with ease onto his side using bedrail only.    Transfers                 General transfer comment: unable.  Pt and daughter declined any mobility beyond rolling.  Had a conversation with palliative care nurses about this "balance" of pain control and mobility.  Daughter and nurses continuned conversation after therapy.    Balance                                           ADL either performed or assessed with clinical judgement   ADL Overall ADL's : Needs assistance/impaired Eating/Feeding: Bed level;Moderate assistance Eating/Feeding Details (indicate cue type and reason): If pt could tolerate sitting up in bed to around 60 or 70 degrees, feel pt would be able to feed self. Grooming: Moderate assistance;Bed level Grooming Details (indicate cue type and reason): Would help pt to sit up in bed to 50 or 70 degrees. Attempting to find happy medium between being indepenedent, pain control, and trying new tasks as more pain meds have been given.                     Toilet Transfer Details (indicate cue type and reason): unable Toileting- Clothing Manipulation and Hygiene: Total assistance;Bed level       Functional mobility during ADLs: Total assistance;+2 for physical assistance General ADL Comments: Session focused on discussion of balancing pain control with mobility as pain meds are increased.  Spoke with daughter about considering some future attempts to sit pt up on side of bed since goals are to take pt home and daughter states she wants community transport to take him to dr. Kendrick Fries in a wheelchair. At  this point, that will not be feasable with pt's lack of mobility.     Vision   Vision Assessment?: No apparent visual deficits   Perception     Praxis      Cognition Arousal/Alertness: Lethargic Behavior During Therapy: Flat affect Overall Cognitive Status: Impaired/Different from baseline Area of Impairment: Attention;Memory;Awareness;Problem solving                   Current Attention Level: Sustained Memory: Decreased short-term memory     Awareness: Intellectual Problem Solving: Slow processing;Decreased initiation;Difficulty sequencing;Requires verbal cues;Requires tactile cues General Comments: Difficult to assess as daughter is very focused on his pain and his O2 saturation levels.  Pt's pain seems to be well controlled at this moment but daughter and pt seem afraid to move recalling what pain was in last few days.  Encouraged them to be open to moving pt and attemting to sit up while always monitoring pain and stopping if it becomes too much.        Exercises     Shoulder Instructions       General Comments Pt continues to be limited with all mobility due to pain.  Pain control appears better as pt rolling with increased ease.  Working with daughter to increase trust for therapy to continue attempts to progress mobility to EOB.    Pertinent Vitals/ Pain       Pain Assessment: Faces Faces Pain Scale: Hurts whole lot Pain Location: L back/side with bed mobility attempts Pain Descriptors / Indicators: Grimacing;Guarding;Moaning;Shooting Pain Intervention(s): Limited activity within patient's tolerance;Monitored during session;Repositioned;Premedicated before session;Relaxation  Home Living                                          Prior Functioning/Environment              Frequency  Min 2X/week        Progress Toward Goals  OT Goals(current goals can now be found in the care plan section)  Progress towards OT goals:  Progressing toward goals  Acute Rehab OT Goals Patient Stated Goal: get appropriate DME for pt, maximize home setup for pt comfort & care.  Daughter does want pt to be able to tolerate going to doc appts with community medical transport which includes pt being able to tolerate transferring to and sitting in a wheelchair. OT Goal Formulation: With patient/family Time For Goal Achievement: 01/25/21 Potential to Achieve Goals: Fair ADL Goals Pt Will Perform Eating: with set-up;bed level;sitting Pt Will Perform Grooming: with set-up;sitting;bed level Additional ADL Goal #1: Pt/family to demonstrate understanding of skin integrity maintenance strategies with min verbal cues Additional ADL Goal #2: Pt/family to demonstrate safe assist in completing ADLs at bed level  Plan Discharge plan remains appropriate    Co-evaluation  AM-PAC OT "6 Clicks" Daily Activity     Outcome Measure   Help from another person eating meals?: A Lot Help from another person taking care of personal grooming?: A Lot Help from another person toileting, which includes using toliet, bedpan, or urinal?: Total Help from another person bathing (including washing, rinsing, drying)?: A Lot Help from another person to put on and taking off regular upper body clothing?: A Lot Help from another person to put on and taking off regular lower body clothing?: Total 6 Click Score: 10    End of Session    OT Visit Diagnosis: Muscle weakness (generalized) (M62.81);Pain Pain - Right/Left: Left   Activity Tolerance Patient limited by pain   Patient Left in bed;with call bell/phone within reach;with bed alarm set;with family/visitor present   Nurse Communication Mobility status        Time: 1594-5859 OT Time Calculation (min): 30 min  Charges: OT General Charges $OT Visit: 1 Visit OT Treatments $Self Care/Home Management : 23-37 mins   Glenford Peers 01/14/2021, 9:36 AM

## 2021-01-14 NOTE — Progress Notes (Addendum)
PROGRESS NOTE    Cody Carr   VOH:[id number]  DOB: Jul 13, 1945  PCP: Monico Blitz, MD    DOA: 01/08/2021 LOS: 5    Brief Narrative / Hospital Course to Date:   75 y.o. male, with history of nephrolithiasis, hyperlipidemia, hypertension, prostate cancer with seeds, throat cancer status post chemoradiation per patient, and more presented the [facility] ED with a chief complaint of back pain, constipation, and poor p.o. intake.  CT showed right-sided pleural effusion, large necrotic appearing mass in the posterior right lower lobe appearing to cross the right diaphragm extending into adjacent liver.  Imaging also revealed mediastinal lymphadenopathy, lytic lesions in the lumbar spine concerning for metastatic disease enhancing soft tissues mass in the left gluteal muscle, bilateral renal lesions.  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  Patient was seen by oncology.  Transferred to [facility] for IR to obtain tissue for biopsy.   Assessment & Plan   Principal Problem:   Non-small cell carcinoma of lung, stage 4, right (HCC) Active Problems:   Acute respiratory failure with hypoxia ([place])   CAP (community acquired pneumonia)   Hypercalcemia   Malignant pleural effusion   Constipation   Protein-calorie malnutrition, moderate (HCC)   Pressure injury of skin   Protein-calorie malnutrition, severe   Acute respiratory failure with hypoxia secondary to postobstructive pneumonia versus atelectasis 9/11: Patient continues to require 3 L/min nasal cannula oxygen and has productive cough. --Supplement O2 to maintain sats above 90%, wean as tolerated --Completed antibiotics  --Continuous pulse ox --Pulmonary hygiene with I-S and flutter --Bronchodilators as needed --Home oxygen qualification - spO2 89-97 on room air, but reportedly pt desats during sleep --Continuous pulse ox ordered  Stage IV non-small cell lung cancer, newly diagnosed -primary right  lower lobe lung mass with metastatic involvement of multiple lumbar and thoracic vertebrae, lumbar paraspinal soft tissue, left gluteus muscle, primary mass extension across diaphragm into liver, possibly spleen and kidneys.  No brain metastases seen on MRI. IR performed CT-guided lung biopsy 9/8. Pathology shows non-small cell carcinoma. --Oncology consulted, patient seen by Dr. Delton Coombes at [facility] and will follow up --9/12 requested Oncology team discuss diagnosis and prognosis with family and patient to help with [place] decisions --Palliative care following, appreciate assistance --Pain control - ongoing titration of medications for adequate relief --   PO Roxanol, Decadron, Robaxin PRN, IV morphine only if oral meds not effective --Bowel regimen  Lumbar radiculopathy - due to lumbar foraminal stenosis related to tumor burden.   --Trial of Decadron --Consider adding gabapentin   Malignant hypercalcemia -Resolved.  Most likely secondary to skeletal lytic lesions.   Treated with Zometa on 9/7 and aggressive IV hydration. --Reduce NS to maintenance rate  Right pleural effusion, small - most likely malignant.   This does not seem to be impacting oxygenation, room air spO2 89-97%.  No thoracentesis needed at this time, monitor.  Community-acquired pneumonia vs postobstructive pneumonia/atelectasis -Completed course of Rocephin and Zithromax.  Mucinex.  Pulmonary hygiene.   Chest PT for difficulty clearing secretions.  Constipation -likely opioid induced. --Relistor given 9/10 --repeat Relistor today --suppository after pain medicine if no BM by tomorrow --other laxative on hold for Relistor  Moderate protein calorie malnutrition -encourage p.o. intake and continue supplements.   --Consult dietitian  Hyperlipidemia - d/c statin per pt/family request  Hypertension -continue lisinopril.  As needed hydralazine for SBP> 160.  Have increased lisinopril dose due to BP's  elevated. Daughter reports hx of  pt's BP dropping too low with lisinopril in the past.  Monitor closely.   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  Further complicates lung malignancy and overall prognosis unfortunately.   Patient BMI: Body mass index is 24.78 kg/m.   DVT prophylaxis: heparin injection 5,000 Units Start: 01/11/21 0600 SCDs Start: 01/08/21 2224   Diet:  Diet Orders (From admission, onward)     Start     Ordered   01/14/21 1043  Diet full liquid Room service appropriate? Yes; Fluid consistency: Thin  Diet effective now       Question Answer Comment  Room service appropriate? Yes   Fluid consistency: Thin      01/14/21 1042              Code Status: Full Code   Subjective 01/14/21    Patient seen with daughter [name] at bedside today along with palliative care.  Pt states he is having two different types of pain.  When he is still, he is mostly pain free but has left lower extremity nerve pain.  When moved for anything, has exacerbated back and coccyx pain.  Discussed trial of steroid and/or gabapentin for the nerve pain.  We clarified proper use of morphine - oral first and IV reserved only if oral is not effective.    Patient seen by SLP this AM, on full liquid diet.    Discussed asking oncology to see and discuss potential treatment options and prognosis to help with goals of care decisions.  Poor functional status at this time, unclear his body would be able to tolerate aggressive systemic therapies.   Disposition Plan & Communication   Status is: Inpatient  Remains inpatient appropriate because:  Pain uncontrolled, regimen being adjusted.    Dispo: The patient is from: Home              Anticipated d/c is to: Home              Patient currently is not medically stable to d/c.   Difficult to place patient No   Family  Communication: Wife and daughter at bedside on rounds 9/8, 9/9, 9/10.  Daughter present 9/11, 9/12.   Consults, Procedures, Significant Events   Consultants:  Interventional radiology Oncology Palliative care  Procedures:  CT-guided right lower lobe lung biopsy 01/11/2019  Antimicrobials:  Anti-infectives (From admission, onward)    Start     Dose/Rate Route Frequency Ordered Stop   01/09/21 2000  cefTRIAXone (ROCEPHIN) 2 g in sodium chloride 0.9 % 100 mL IVPB        2 g 200 mL/hr over 30 Minutes Intravenous Every 24 hours 01/08/21 2224 01/13/21 2018   01/09/21 2000  azithromycin (ZITHROMAX) 500 mg in sodium chloride 0.9 % 250 mL IVPB        500 mg 250 mL/hr over 60 Minutes Intravenous Every 24 hours 01/08/21 2224 01/13/21 2142   01/08/21 1930  cefTRIAXone (ROCEPHIN) 1 g in sodium chloride 0.9 % 100 mL IVPB        1 g 200 mL/hr over 30 Minutes Intravenous  Once 01/08/21 1929 01/08/21 2053   01/08/21 1930  azithromycin (ZITHROMAX) 500 mg in sodium chloride 0.9 % 250 mL IVPB        500 mg 250 mL/hr over 60 Minutes Intravenous  Once 01/08/21 1929 01/08/21 2219         Micro  Objective   Vitals:   01/14/21 0403 01/14/21 0816 01/14/21 1147 01/14/21 1610  BP: (!) 161/82 (!) 153/85    Pulse: 78 89 96 94  Resp: 20 18 18 18   Temp: 98.1 F (36.7 C) 98.2 F (36.8 C)    TempSrc: Oral Oral    SpO2: 100% 97% 95% 97%  Weight: 85.2 kg       Intake/Output Summary (Last 24 hours) at 01/14/2021 1738 Last data filed at 01/14/2021 1500 Gross per 24 hour  Intake 700.76 ml  Output 1100 ml  Net -399.24 ml   Filed Weights   01/13/21 0425 01/14/21 0403  Weight: 83.1 kg 85.2 kg    Physical Exam:  General exam: awake but appears fatigued, alert, no acute distress, underweight Respiratory system: on 3 L/min nasal cannula oxygen, normal respiratory effort Cardiovascular system: RRR, no pedal edema.   Central nervous system: A&O x3. no gross focal neurologic deficits, normal  speech but very poor vocal projection Psychiatry: normal mood, congruent affect, judgement and insight appear normal  Labs   Data Reviewed: I have personally reviewed following labs and imaging studies  CBC: Recent Labs  Lab 01/08/21 1532 01/09/21 0522 01/09/21 1850 01/10/21 0126 01/11/21 0509 01/12/21 0114 01/13/21 0047 01/14/21 0131  WBC 22.9* 21.3*   < > 19.0* 21.0* 23.5* 23.1* 22.7*  NEUTROABS 20.6* 19.0*  --   --   --   --   --   --   HGB 11.8* 10.7*   < > 9.3* 9.2* 9.4* 9.0* 8.6*  HCT 38.0* 35.6*   < > 29.8* 30.1* 30.7* 29.4* 28.0*  MCV 87.2 88.3   < > 87.4 88.5 87.2 88.0 88.3  PLT 563* 535*   < > 407* 423* 456* 386 349   < > = values in this interval not displayed.   Basic Metabolic Panel: Recent Labs  Lab 01/10/21 0126 01/11/21 0509 01/12/21 0114 01/13/21 0047 01/14/21 0131  NA 137 140 141 139 138  K 3.3* 3.1* 3.2* 3.5 3.8  CL 105 104 108 106 105  CO2 26 25 24 25 26   GLUCOSE 81 74 70 88 93  BUN 17 15 12 9  7*  CREATININE 0.70 0.71 0.62 0.58* 0.57*  CALCIUM 10.4* 10.1 9.0 8.3* 8.1*  MG 1.9 1.9 1.8 1.8 1.8   GFR: Estimated Creatinine Clearance: 90.2 mL/min (A) (by C-G formula based on SCr of 0.57 mg/dL (L)). Liver Function Tests: Recent Labs  Lab 01/08/21 1532 01/09/21 0522 01/10/21 0126 01/11/21 0509 01/13/21 0047  AST 19 15 14* 13* 17  ALT 22 17 15 15 17   ALKPHOS 175* 157* 124 118 120  BILITOT 1.3* 1.0 1.2 1.5* 1.2  PROT 5.7* 5.3* 4.7* 4.7* 4.5*  ALBUMIN 2.2* 2.0* 1.6* 1.6* 1.5*   Recent Labs  Lab 01/08/21 1532  LIPASE 24   No results for input(s): AMMONIA in the last 168 hours. Coagulation Profile: Recent Labs  Lab 01/10/21 0804  INR 1.5*   Cardiac Enzymes: No results for input(s): CKTOTAL, CKMB, CKMBINDEX, TROPONINI in the last 168 hours. BNP (last 3 results) No results for input(s): PROBNP in the last 8760 hours. HbA1C: No results for input(s): HGBA1C in the last 72 hours. CBG: No results for input(s): GLUCAP in the last 168  hours. Lipid Profile: No results for input(s): CHOL, HDL, LDLCALC, TRIG, CHOLHDL, LDLDIRECT in the last 72 hours. Thyroid Function Tests: No results for input(s): TSH, T4TOTAL, FREET4, T3FREE, THYROIDAB in the last 72 hours.  Anemia Panel: No results  for input(s): VITAMINB12, FOLATE, FERRITIN, TIBC, IRON, RETICCTPCT in the last 72 hours. Sepsis Labs: Recent Labs  Lab 01/09/21 0857  PROCALCITON 0.30    Recent Results (from the past 240 hour(s))  SARS CORONAVIRUS 2 (TAT 6-24 HRS) Nasopharyngeal Nasopharyngeal Swab     Status: None   Collection Time: 01/08/21  9:27 PM   Specimen: Nasopharyngeal Swab  Result Value Ref Range Status   SARS Coronavirus 2 NEGATIVE NEGATIVE Final    Comment: (NOTE) SARS-CoV-2 target nucleic acids are NOT DETECTED.  The SARS-CoV-2 RNA is generally detectable in upper and lower respiratory specimens during the acute phase of infection. Negative results do not preclude SARS-CoV-2 infection, do not rule out co-infections with other pathogens, and should not be used as the sole basis for treatment or other patient management decisions. Negative results must be combined with clinical observations, patient history, and epidemiological information. The expected result is Negative.  Fact Sheet for Patients: SugarRoll.be  Fact Sheet for Healthcare Providers: https://www.woods-mathews.com/  This test is not yet approved or cleared by the [place] FDA and  has been authorized for detection and/or diagnosis of SARS-CoV-2 by FDA under an Emergency Use Authorization (EUA). This EUA will remain  in effect (meaning this test can be used) for the duration of the COVID-19 declaration under Se ction 564(b)(1) of the Act, 21 U.S.C. section 360bbb-3(b)(1), unless the authorization is terminated or revoked sooner.  Performed at [facility], [place] [address], [place], [place] [address]       Imaging Studies   No  results found.   Medications   Scheduled Meds:  (feeding supplement) PROSource Plus  30 mL Oral BID BM   dexamethasone (DECADRON) injection  8 mg Intravenous Q8H   feeding supplement  1 Container Oral TID BM   guaiFENesin-dextromethorphan  5 mL Oral Q6H   heparin  5,000 Units Subcutaneous Q8H   lisinopril  10 mg Oral QPM   mouth rinse  15 mL Mouth Rinse BID   multivitamin with minerals  1 tablet Oral Daily   Continuous Infusions:  methocarbamol (ROBAXIN) IV         LOS: 5 days    Time spent: 40 minutes with greater than 50% spent at bedside and coordinating care    Ezekiel Slocumb, DO [facility]  01/14/2021, 5:38 PM      If 7PM-7AM, please contact night-coverage. How to contact the [facility] Attending or Consulting provider [place] or covering provider during after hours [place], for this patient?    Check the care team in [facility] and look for a) attending/consulting [facility] provider listed and b) the [facility] team listed Log into [email or web address] and use [place]'s universal password to access. If you do not have the password, please contact the hospital operator. Locate the [facility] provider you are looking for under [facility] and page to a number that you can be directly reached. If you still have difficulty reaching the provider, please page the [facility] (Director on Call) for the Hospitalists listed on amion for assistance.

## 2021-01-14 NOTE — Progress Notes (Addendum)
Nutrition Follow Up  DOCUMENTATION CODES:   Severe malnutrition in context of chronic illness  INTERVENTION:   Recommend discussion of GOC regarding artifical nutrition as even with diet advancement patient likely not to meet needs PO.   Recommend addition of schedule bowel regimen- day 5 without BM  Boost Breeze po TID, each supplement provides 250 kcal and 9 grams of protein ProSource Plus 30 ml BID, each supplement provides 100 kcals and 15 grams protein.  MVI with minerals daily   NUTRITION DIAGNOSIS:   Severe Malnutrition related to chronic illness (dysphagia) as evidenced by severe fat depletion, severe muscle depletion, energy intake < or equal to 75% for > or equal to 1 month.  Ongoing  GOAL:   Patient will meet greater than or equal to 90% of their needs  Not meeting  MONITOR:   PO intake, Supplement acceptance, Diet advancement, Labs, I & O's, Skin, Weight trends  REASON FOR ASSESSMENT:   Consult Assessment of nutrition requirement/status  ASSESSMENT:   75 yo male who presented 01/08/21 to ED with back pain, constipation and poor PO intake. Pt with hx of progressive back pain up to 3 months ago and believed it was a mechanical or nerve injury. Pt with recent hospitalization at [facility] for constipation and dehydration. CT showed small R pleural effusion with large necrotic appearing mass in R lower lobe that extends into diaphragm and liver. 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  PMH: nephrolithiasis, hyperlipidemia, hypertension, prostate cancer with seeds, throat cancer status post chemoradiation.`  Spoke with daughter at beside who reports around 6-8 months ago patient's intake decreased significantly in attempts to lose 20 lb. Patient felt he was "too big" and wanted to lose weight to feel better. He was able to lose from 215 lb in December 2021 to 195 lb in March 2022. His intake declined further (unintentionally) during [facility] admission  last month due to swallowing issues. Weight since March has declined to 183 lb (14.8% wt loss in 9 months). Patient was able to consume eggs with grits for ~2 weeks, then thin liquids only over the last week. Had a few vomiting episodes upon drinking supplements too fast. Of note, when patient underwent radiation in 2007 a PEG was placed for nutrition, this has seen been removed.   Discussed case with SLP. Diet changed to full liquids as there is concern for esophageal dysmotility. Plan FEES this afternoon. Recommend discussion of [place] regarding artifical nutrition as even with diet advancement patient likely not to meet needs PO.   RD to send Boost Breeze and ProSource as this is the only supplement patient can handle (when watered down).   Noted patient has not had BM in 5 days. Recommend addition of scheduled bowel regimen.   Admission weight: 83.1 kg  Current weight: 85.2 kg   UOP: 1200 ml x 24 hrs   Medications: decadron Labs: reviewed   NUTRITION - FOCUSED PHYSICAL EXAM:  Flowsheet Row Most Recent Value  Orbital Region Severe depletion  Upper Arm Region Severe depletion  Thoracic and Lumbar Region Severe depletion  Buccal Region Severe depletion  Temple Region Severe depletion  Clavicle Bone Region Severe depletion  Clavicle and Acromion Bone Region Severe depletion  Scapular Bone Region Severe depletion  Dorsal Hand Severe depletion  Patellar Region Severe depletion  Anterior Thigh Region Severe depletion  Posterior Calf Region Severe depletion  Edema (RD Assessment) None  Hair Reviewed  Eyes Reviewed  Mouth Reviewed  Skin Reviewed  Nails Reviewed      Diet Order:   Diet Order             Diet full liquid Room service appropriate? Yes; Fluid consistency: Thin  Diet effective now                  EDUCATION NEEDS:  Education needs have been addressed  Skin:  Skin Assessment: Skin Integrity Issues: Skin Integrity Issues:: Stage II, Other (Comment) Stage II:  PI - mid sacrum Other: Puncture - R back  Last BM:  9/7  Height:  Ht Readings from Last 1 Encounters:  08/24/20 6\' 1"  (1.854 m)   Weight:  Wt Readings from Last 1 Encounters:  01/14/21 85.2 kg   BMI:  Body mass index is 24.78 kg/m.  Estimated Nutritional Needs:   Kcal:  1950-2150  Protein:  95-110 grams  Fluid:  >1.95 L  Maiya Kates MS, RD, LDN, CNSC Clinical Nutrition Pager listed in [place]

## 2021-01-14 NOTE — Progress Notes (Signed)
Palliative Care Progress Note, Assessment & Plan   Patient Name: Cody Carr       Date: 01/14/2021 DOB: 12/27/1945  Age: 75 y.o. MRN#: [id number] Attending Physician: Cody Slocumb, DO Primary Care Physician: Cody Blitz, MD Admit Date: 01/08/2021  Reason for Consultation/Follow-up: Establishing goals of care  Subjective: Patient is lying in bed in NAD. He is lethargic but has no acute c/o pain. He is able to participate in our discussion but nods off. His daughter/Cody Carr is at bedside.  Occupational therapy was working with him when I entered the room. He declined to work with them today but did participate with rolling in bed.   HPI: 75 y.o. male  with past medical history of throat cancer s/p chemo/radiation, prostate cancer s/p seeds, nephrolithiasis, hypertension, hyperlipidemia admitted on 01/08/2021 with constipation, back pain, poor po intake and concern for underlying lung cancer with concern for multiple sites of metastatic disease.   Plan of Care: I have reviewed medical records including EPIC notes, labs and imaging, assessed the patient and then met with patient, patient's daughter Cody Carr, Occupational therapist Cody Carr and Speech therapist Cody Carr  working with patient, to discuss diagnosis, prognosis, GOC, EOL wishes, disposition and options.  I discussed patient's current illness and what it means in the larger context of patient's on-going co-morbidities.  Biopsy results are still pending.  Both the patient and daughter are awaiting these results to see what treatment plans if any are available.  I discussed the importance of having the full picture of his diagnosis in order to make a proper goals of care and treatment plan.  I briefly reviewed the patient's blood work and discussed in  detail how albumin and WBC levels are abnormal, which also contributes to the numerous comorbidities that are concerning for his overall wellbeing.    I shared with both the patient and daughter that I am worried about not only his immediate plan of care but what the next steps are in regard to him going home or being able to get back and forth from home to any future doctors appointments.    Daughter expressed that her goal would be to have some sort of lift at home to be able to move the patient from the bed to a chair.  She is hopeful that this chair would be something she could transport him to and from doctor's appointments with.  Occupational Therapy reiterated that it caused excruciating pain for the patient just to roll in bed.  She also shared that the patient is many steps away from being able to transfer to a lift without it also causing excruciating pain.  I again discussed the topic of artificial feeding.  The patient continues to endorse that no food tastes good and he made hand gestures suggesting that when he eats food he feels as if it is going to come back before he will vomit.  He has had a feeding tube in the past.  When asked what his thoughts are are artificial feeding he was not ready to give a definitive response.    Speech therapy was present for part of this discussion.  She said she would evaluate him at the bedside  but solve the need for further testing to fully evaluate his swallowing.  Patient has a history of tonsillary carcinoma and a separate diagnosis of throat cancer from 2008.  Natural disease trajectory and expectations at EOL were discussed.  I attempted to elicit values and goals of care important to the patient.  The daughter mentioned that the patient was resistant to going to doctors appointments prior to this admission.  I asked if he could identify feeling as though if he was going to get bad news that perhaps he just did not want to hear it or deal with it.   He nodded and said that was probably true for him.   Advance directives and concepts specific to code status and artificial feeding were considered and discussed. Education offered to patient/family the importance of continued conversation with family and the medical providers regarding overall plan of care and treatment options, ensuring decisions are within the context of the patient's values and GOCs.    Hospice services were explained and offered. The patient's daughter said that she understood that the patient can go home with hospice.  Questions and concerns were addressed. The family was encouraged to call with questions or concerns.   Code Status: Full code Educated patient/family to consider DNR/DNI status understanding evidenced based poor outcomes in similar hospitalized patient, as the cause of arrest is likely associated with advanced chronic illness rather than an easily reversible acute cardio-pulmonary event.   Prognosis:  Unable to determine, likely long term poor prognosis with patient's poor physical and functional  status  Discharge Planning: To Be Determined  Recommendations/Plan: FULL CODE Pain management-    patient describes "different pain, in different locations, at different levels"  He describes pain down his left hip/leg as "nerve pain".  It is hard for him to his scale.  Plan is to add Dexamethasone 8 mg IV every 8 hrs.  Utilize oral morphine/Roxanol as ordered, only utilize Morphine IV as rescue. Re-evaluate in the morning for consideration of adding Gabapentin (Discussed in detail with daughter)  Education offered to nursing regarding plan SLP evaluation today, diet changed to full liquid Continue current treatment/treat the treatable,  family is hopeful for improvement Oncology to see Cody Carr IP- patient/family  needs information regarding viable treatment options for this patient at this time in this situation as they make hard decisions regarding  GOCs  Care plan was discussed with Occupational therapy, speech therapy, patient and patient's daughter  Length of Stay: 5  Physical Exam Constitutional:      Appearance: He is ill-appearing.  HENT:     Head: Normocephalic.  Cardiovascular:     Rate and Rhythm: Normal rate.  Pulmonary:     Comments: Productive, wet cough Musculoskeletal:     Right lower leg: Edema present.     Left lower leg: Edema present.     Comments: Generalized weakness and muscle atrophy   Skin:    General: Skin is warm and dry.  Neurological:     Mental Status: He is alert. Mental status is at baseline.  Psychiatric:        Mood and Affect: Mood normal.            Vital Signs: BP (!) 153/85 (BP Location: Left Arm)   Pulse 89   Temp 98.2 F (36.8 C) (Oral)   Resp 18   Wt 85.2 kg   SpO2 97%   BMI 24.78 kg/m  SpO2: SpO2: 97 % O2 Device: O2 Device: Nasal  Cannula O2 Flow Rate: O2 Flow Rate (L/min): 2 L/min      Palliative Assessment/Data: 30%       Total Time 90 minutes Prolonged Time Billed  yes   Greater than 50%  of this time was spent counseling and coordinating care related to the above assessment and plan.  Thank you for allowing the Palliative Medicine Team to assist in the care of this patient.  [place] I[place]a Iha, FNP-BC Palliative Medicine Team Team Phone # [phone]

## 2021-01-14 NOTE — Progress Notes (Addendum)
HEMATOLOGY-ONCOLOGY PROGRESS NOTE  SUBJECTIVE: Cody Carr is seen today for oncology follow up. He was initially seen by our partner, Dr. Delton Coombes, at [facility] on 01/09/2021 for presumed lung cancer. See full consult note form this date. He was transferred to [facility] for IR biopsy. He underwent biopsy of his right lung mass on 01/10/2021. This was consistent with poorly differentiated nsclca, squamous cell carcinoma.   I went to the patient's room to meet with him and his daughter.  However, the patient's daughter was not at the bedside.  I was told by nursing that she had left.  The patient was sleeping very quietly.  I attempted to awaken him several times, but could not.  I was told by nursing that he had just received 4 mg of morphine.  Oncology History  Prostate cancer ([place])  12/09/2019 Initial Diagnosis   Prostate cancer ([place])   02/09/2020 Cancer Staging   Staging form: Prostate, AJCC 8th Edition - Clinical stage from 02/09/2020: Stage I (cT1c, cN0, cM0, PSA: 7.3, Grade Group: 1) - Signed by Freeman Caldron, PA-C on 05/11/2020      REVIEW OF SYSTEMS:   Unable to obtain.  PHYSICAL EXAMINATION: ECOG PERFORMANCE STATUS: 3 - Symptomatic, >50% confined to bed  Vitals:   01/14/21 0816 01/14/21 1147  BP: (!) 153/85   Pulse: 89 96  Resp: 18 18  Temp: 98.2 F (36.8 C)   SpO2: 97% 95%   Filed Weights   01/13/21 0425 01/14/21 0403  Weight: 83.1 kg 85.2 kg    Intake/Output from previous day: 09/11 0701 - 09/12 0700 In: 700.8 [I.V.:0.8; IV Piggyback:700] Out: 1200 [Urine:1200]  GENERAL: Chronically ill-appearing male, laying in bed, no distress SKIN: skin color, texture, turgor are normal, no rashes or significant lesions LUNGS: Diminished breath sounds in the anterior lung fields HEART: Regular rate and rhythm, no lower extremity edema ABDOMEN:abdomen soft, non-tender and normal bowel sounds NEURO: Resting quietly  LABORATORY DATA:  I have reviewed the data as  listed CMP Latest Ref Rng & Units 01/14/2021 01/13/2021 01/12/2021  Glucose 70 - 99 mg/dL 93 88 70  BUN 8 - 23 mg/dL 7(L) 9 12  Creatinine 0.61 - 1.24 mg/dL 0.57(L) 0.58(L) 0.62  Sodium 135 - 145 mmol/L 138 139 141  Potassium 3.5 - 5.1 mmol/L 3.8 3.5 3.2(L)  Chloride 98 - 111 mmol/L 105 106 108  CO2 22 - 32 mmol/L 26 25 24   Calcium 8.9 - 10.3 mg/dL 8.1(L) 8.3(L) 9.0  Total Protein 6.5 - 8.1 g/dL - 4.5(L) -  Total Bilirubin 0.3 - 1.2 mg/dL - 1.2 -  Alkaline Phos 38 - 126 U/L - 120 -  AST 15 - 41 U/L - 17 -  ALT 0 - 44 U/L - 17 -    Lab Results  Component Value Date   WBC 22.7 (H) 01/14/2021   HGB 8.6 (L) 01/14/2021   HCT 28.0 (L) 01/14/2021   MCV 88.3 01/14/2021   PLT 349 01/14/2021   NEUTROABS 19.0 (H) 01/09/2021    CT Angio Chest PE W and/or Wo Contrast  Result Date: 01/08/2021 CLINICAL DATA:  Chest pain.  High probability for PE. EXAM: CT ANGIOGRAPHY CHEST WITH CONTRAST TECHNIQUE: Multidetector CT imaging of the chest was performed using the standard protocol during bolus administration of intravenous contrast. Multiplanar CT image reconstructions and MIPs were obtained to evaluate the vascular anatomy. CONTRAST:  13m OMNIPAQUE IOHEXOL 350 MG/ML SOLN COMPARISON:  Prior CT scan of the chest 04/05/2020 FINDINGS: Cardiovascular: Satisfactory  opacification of the pulmonary arteries to the segmental level. No evidence of pulmonary embolism. Normal heart size. No pericardial effusion. Fusiform aneurysmal dilation of the ascending thoracic aorta with a maximal transverse diameter of 4.5 cm. Scattered atherosclerotic calcifications throughout the aorta and the native coronary arteries. Mediastinum/Nodes: Unremarkable thyroid gland. High right paratracheal lymph node measures 1.4 cm in short axis (image 22 series 4). Left paratracheal lymph node measures 1.5 cm in short axis (image 34 series 4). Subcarinal lymphadenopathy measures approximately 2.2 cm in short axis (image 42 series 4). Posterior  left mediastinal mass measures a proximally 4.5 x 2.8 x 3.8 cm indirectly invades the head and neck of the left third rib. The mass also extends into the neural foramen. Lungs/Pleura: Approximally 9.0 x 8.0 x 8.7 cm ill-defined heterogeneous mass centered in the posteromedial aspect of the right lower lobe contains numerous punctate internal calcifications. The mass also appears to extend across the diaphragm and directly into segment 7 of the liver. There is a small and likely malignant associated right pleural effusion. 0.6 cm right middle lobe pulmonary nodule (image 73 series 6) is indeterminate. 2 mm nodule more superiorly on image 67 also noted. Peripheral subpleural reticulation, architectural distortion and honeycombing in the left lung base suggests underlying pulmonary fibrosis. Upper Abdomen: No acute abnormality. Musculoskeletal: Soft tissue lytic lesion destroying the head and neck of the left third rib with extension into the neural foramen as described above. No additional foci of osseous metastatic disease identified. Review of the MIP images confirms the above findings. IMPRESSION: 1. Approximally 9 cm ill-defined heterogeneous right lower lobe mass appears to cross the diaphragm and directly invade segment 7 of the liver. Findings are highly concerning for advanced primary bronchogenic carcinoma. 2. Lytic soft tissue metastasis in the left posterior mediastinum centered on the head and neck of the left third rib. The mass encroaches into the left T3-T4 neural foramen. 3. Presumably metastatic mediastinal lymphadenopathy. 4. Small right-sided pleural effusion is likely malignant. 5. Nonspecific right middle lobe pulmonary nodules measuring 0.6 and 0.2 cm. Small pulmonary metastases versus incidental benign nodules. 6. 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 7. 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 Ascending thoracic aortic aneurysm. Recommend  semi-annual imaging followup by CTA or MRA and referral to cardiothoracic surgery if not already obtained. This recommendation follows 2010 ACCF/AHA/AATS/ACR/ASA/SCA/SCAI/SIR/STS/SVM Guidelines for the Diagnosis and Management of Patients With Thoracic Aortic Disease. Circulation. 2010; 121: F026-V785. Aortic aneurysm NOS (ICD10-I71.9); Aortic Atherosclerosis (ICD10-I70.0). 8. Coronary artery calcifications. Electronically Signed   By: Jacqulynn Cadet M.D.   On: 01/08/2021 17:09   MR BRAIN W WO CONTRAST  Result Date: 01/12/2021 CLINICAL DATA:  Initial evaluation for metastatic disease. EXAM: MRI HEAD WITHOUT AND WITH CONTRAST TECHNIQUE: Multiplanar, multiecho pulse sequences of the brain and surrounding structures were obtained without and with intravenous contrast. CONTRAST:  5m GADAVIST GADOBUTROL 1 MMOL/ML IV SOLN COMPARISON:  None available. FINDINGS: Brain: Examination mildly degraded by motion artifact. Generalized age-related cerebral atrophy. Patchy T2/FLAIR hyperintensity within the periventricular deep white matter both cerebral hemispheres most consistent with chronic small vessel ischemic disease, mild to moderate in nature. No abnormal foci of restricted diffusion to suggest acute or subacute ischemia. Gray-white matter differentiation maintained. No encephalomalacia to suggest chronic cortical infarction. No evidence for acute or chronic intracranial hemorrhage. No mass lesion, midline shift or mass effect. No hydrocephalus or extra-axial fluid collection. Pituitary gland suprasellar region normal. Midline structures intact. No abnormal enhancement. No  evidence for intracranial metastatic disease. Vascular: Major intracranial vascular flow voids are maintained. Skull and upper cervical spine: Craniocervical junction within normal limits. Bone marrow signal intensity normal. No visible focal marrow replacing lesion. No scalp soft tissue abnormality. Sinuses/Orbits: Patient status post bilateral  ocular lens replacement. Globes and orbital soft tissues demonstrate no other acute finding. Paranasal sinuses are largely clear. Small left mastoid effusion, of doubtful significance. Inner ear structures grossly normal. Other: None. IMPRESSION: 1. No evidence for intracranial metastatic disease. 2. Age-related cerebral atrophy with mild to moderate chronic microvascular ischemic disease. Electronically Signed   By: Jeannine Boga M.D.   On: 01/12/2021 01:31   MR THORACIC SPINE W WO CONTRAST  Result Date: 01/11/2021 CLINICAL DATA:  Initial evaluation for metastatic workup. EXAM: MRI THORACIC WITHOUT AND WITH CONTRAST TECHNIQUE: Multiplanar and multiecho pulse sequences of the thoracic spine were obtained without and with intravenous contrast. CONTRAST:  31m GADAVIST GADOBUTROL 1 MMOL/ML IV SOLN COMPARISON:  Prior CTs from 01/08/2021. FINDINGS: Alignment: Physiologic with preservation of the normal thoracic kyphosis. No listhesis. Vertebrae: Large metastatic implant involving the left aspect of the T1 and T2 vertebral bodies is seen. Lesion measures approximately 5.1 x 3.2 x 3.7 cm (series 19, image 8). Involvement of the adjacent left posterior T2 and T3 ribs noted. Invasion of the adjacent left T2-3 and T3-4 neural foramina. The left T2-3 neural foramen is largely obliterated, with moderate narrowing at the left T3-4 neural foramen. Mild flattening of the left aspect of the adjacent thecal sac without significant stenosis or additional epidural invasion at this time. No associated pathologic fracture. Additional 1.5 cm osseous metastasis seen at the right anterior aspect of T7 (series 22, image 9). Large metastatic implant involving the right posterior L1 vertebral body as well as the adjacent right posterior elements partially visualized (series 22, image 9). This lesion abuts the right aspect of the thecal sac at the level of T12-L1 (series 18, image 39). No other significant metastatic disease seen  elsewhere within the thoracic spine at this time. Vertebral body height maintained. Underlying bone marrow signal intensity diffusely heterogeneous. No evidence for osteomyelitis discitis or septic arthritis. Cord: Normal signal and morphology. Other than the above described lesions at T2-3/T3-4 and L1 abutting the thecal sac, no other discernible epidural involvement. No other abnormal enhancement. Paraspinal and other soft tissues: Large necrotic mass positioned at the posterior right lung base again seen. Direct extension through the right hemidiaphragm into the adjacent liver noted. Associated irregular right pleural effusion. Right hilar and subcarinal adenopathy. Additional trace left pleural effusion. Findings better evaluated on prior CT. Probable cholelithiasis noted as well. Disc levels: Tiny right paracentral disc protrusion at T7-8 minimally flattens the right ventral thecal sac without significant stenosis. Otherwise, no other significant disc pathology seen within the thoracic spine. No spinal stenosis. Mild bilateral foraminal narrowing noted at T10-11 due to facet disease. IMPRESSION: 1. Large metastatic implant involving the left aspect of the T1 and T2 vertebral bodies with invasion of the adjacent left T2-3 and T3-4 neural foramina. 2. Additional 1.5 cm metastatic implant involving the right anterior aspect of T7. 3. Large metastatic implant involving the right posterior L1 vertebral body and adjacent posterior elements, partially visualized. 4. No other significant metastatic involvement within the thoracic spine at this time. No other epidural involvement. No pathologic fracture. 5. Large necrotic mass at the posterior right lung base with invasion of the adjacent right hemidiaphragm and liver, with associated right pleural effusion and mediastinal adenopathy. Findings  better evaluated on prior CT. Electronically Signed   By: Jeannine Boga M.D.   On: 01/11/2021 04:15   CT ABDOMEN PELVIS  W CONTRAST  Result Date: 01/08/2021 CLINICAL DATA:  Left lower quadrant pain EXAM: CT ABDOMEN AND PELVIS WITH CONTRAST TECHNIQUE: Multidetector CT imaging of the abdomen and pelvis was performed using the standard protocol following bolus administration of intravenous contrast. CONTRAST:  179m OMNIPAQUE IOHEXOL 350 MG/ML SOLN COMPARISON:  CT 01/19/2020, chest CT 01/08/2021, FINDINGS: Lower chest: Mild aneurysmal dilatation of the ascending aorta measuring up to 4.4 cm, heavily calcified. Extensive coronary vascular calcification. Normal cardiac size. Trace pericardial effusion. Incompletely visualized necrotic appearing subcarinal lymph node measuring up to 2.6 cm. Large necrotic appearing mass in the medial right base with punctate calcifications, this measures approximately 9.6 by 7.8 cm and extends across the right diaphragm, into the adjacent liver. Thick rimmed hypodense enhancing lesion in the right hepatic lobe measuring 3.8 cm. Small right-sided pleural effusion Hepatobiliary: No calcified gallstone. No biliary dilatation. Heterogenous enhancement of right hepatic lobe adjacent to mass lesion. Pancreas: Unremarkable. No pancreatic ductal dilatation or surrounding inflammatory changes. Spleen: Indeterminate hypoenhancing mass within the spleen measuring 2.4 cm, series 2, image 34. Adrenals/Urinary Tract: Adrenal glands are normal. Punctate nonobstructing stones within the bilateral kidneys. Scattered renal cysts. Interim finding of 14 mm indeterminate right renal lesion at the midpole, series 2, image 52. Interim finding of indeterminate 13 mm exophytic lesion mid pole left kidney, series 2, image 44. Slightly thick-walled urinary bladder. 4 mm stone within the right posterior bladder. Stomach/Bowel: The stomach is nonenlarged. No dilated small bowel. No acute bowel wall thickening. Moderate to large stool in the rectosigmoid colon with moderate formed feces at the rectum. Negative appendix  Vascular/Lymphatic: Advanced aortic atherosclerosis. No aneurysm. No suspicious nodes. Reproductive: Post treatment changes of the prostate gland Other: Presacral edema and soft tissue stranding.  No free air. Musculoskeletal: Lytic lesion and soft tissue mass involving the L1 vertebra right pedicle, lamina, vertebral body and transverse process. Soft tissue mass involves the paraspinal soft tissues as well. 2.5 cm hypodense lesion in the right paraspinal soft tissues posterior to right transverse process at L2, series 2, image 49. Large lytic lesion L5 vertebral body. Chronic bilateral pars defect at L5. 5.5 x 4.3 cm enhancing heterogenous soft tissue mass in the left gluteus muscles, series 2, image 93. IMPRESSION: 1. Small right-sided pleural effusion with large necrotic appearing mass in the posteromedial right lower lobe that appears to traverse the right diaphragm and extends into the adjacent liver. 2. Lytic lesion with associated soft tissue mass at L1 with additional lytic lesion at L5 vertebral body consistent with metastatic disease. 5.5 cm heterogenous enhancing soft tissue mass in the left gluteus muscles also suspect for metastatic disease. Small hypodense right paraspinal soft tissue mass posterior to transverse process of L2, concerning for metastatic disease 3. Vague hypodensity within the mid spleen is indeterminate for infarct or hypodense mass lesion. 4. Interval development of bilateral indeterminate renal lesions measuring up to 13 mm. 5. Nonobstructing kidney stones.  4 mm stone in the bladder Electronically Signed   By: KDonavan FoilM.D.   On: 01/08/2021 17:39   CT L-SPINE NO CHARGE  Result Date: 01/08/2021 CLINICAL DATA:  Back pain. EXAM: CT LUMBAR SPINE WITH CONTRAST TECHNIQUE: Technique: Multiplanar CT images of the lumbar spine were reconstructed from contemporary CT of the Abdomen and Pelvis. CONTRAST:  No additional COMPARISON:  CT abdomen and pelvis 01/19/2020 FINDINGS:  Segmentation:  5 lumbar type vertebrae. Alignment: Normal. Vertebrae: Chronic bilateral L5 pars defects. 5 cm destructive mass involving the right-sided posterior elements and posterior vertebral body of L1 mildly encroaching upon the right L1-2 neural foramen. 3.7 cm lytic lesion in the left aspect of the L5 vertebral body with focal disruption of the posterior vertebral body cortex and likely small volume epidural tumor in the left lateral recess. Preserved vertebral body heights. Paraspinal and other soft tissues: 2.5 cm hypodense mass in the paraspinal soft tissues posterior to the right L2 transverse process. Intra-abdominal and pelvic contents reported separately. Disc levels: Mild lumbar spondylosis and facet arthrosis without high-grade spinal stenosis. Mild-to-moderate multilevel neural foraminal stenosis. IMPRESSION: 1. Destructive bone lesions at L1 and L5 consistent with metastatic disease. Suspected small volume epidural tumor in the left lateral recess at L5. 2. 2.5 cm paraspinal soft tissue mass posterior to the right L2 transverse process also concerning for metastatic disease. Electronically Signed   By: Logan Bores M.D.   On: 01/08/2021 18:16   DG Chest Port 1 View  Result Date: 01/10/2021 CLINICAL DATA:  Status post biopsy of right lower lobe lung mass. EXAM: PORTABLE CHEST 1 VIEW COMPARISON:  CT chest 01/08/2021 FINDINGS: Intentionally extra tori image of the chest demonstrates no pneumothorax. Low lung volumes. Bandlike density along the right mid lung attributable to fluid in the minor fissure. Retro diaphragmatic density on the right compatible with known right lower lobe lesion. Stable mild peripheral interstitial accentuation in the lungs. Atherosclerotic calcification of the aortic arch. Degenerative glenohumeral arthropathy, left greater than right, with some volume loss in the left humeral head. Thoracic spondylosis noted. The known lytic lesion involving the left third rib and left  third and fourth vertebra is relatively difficult to visualize on today's conventional radiograph. IMPRESSION: 1. No pneumothorax. 2. Trace fluid in the right minor fissure. 3. Mild chronic interstitial accentuation in the lung periphery. 4. Left greater than right glenohumeral arthropathy. 5.  Aortic Atherosclerosis (ICD10-I70.0). 6. Retro diaphragmatic density on the right compatible with known mass. Electronically Signed   By: Van Clines M.D.   On: 01/10/2021 14:51   CT LUNG MASS BIOPSY  Result Date: 01/10/2021 INDICATION: 75 year old male with indeterminate right lower lobe lung mass. EXAM: CT-guided lung biopsy COMPARISON:  CT chest from 01/08/2021 MEDICATIONS: None. ANESTHESIA/SEDATION: Fentanyl 50 mcg IV; Versed 1 mg IV Sedation time: 13 minutes; The patient was continuously monitored during the procedure by the interventional radiology nurse under my direct supervision. CONTRAST:  None COMPLICATIONS: None immediate. PROCEDURE: Informed consent was obtained from the patient following an explanation of the procedure, risks, benefits and alternatives. The patient understands,agrees and consents for the procedure. All questions were addressed. A time out was performed prior to the initiation of the procedure. The patient was positioned in the left lateral decubitus position on the CT table and a limited chest CT was performed for procedural planning demonstrating similar-appearing right lower lobe mass. The operative site was prepped and draped in the usual sterile fashion. Under sterile conditions and local anesthesia, a 17 gauge coaxial needle was advanced into the peripheral aspect of the nodule. Positioning was confirmed with intermittent CT fluoroscopy and followed by the acquisition of total of 3 core samples with an 18 gauge core needle biopsy device. The coaxial needle was removed following deployment of a [employer] plug and superficial hemostasis was achieved with manual compression. Limited  post procedural chest CT was negative for pneumothorax or additional complication. A dressing was placed. The patient  tolerated the procedure well without immediate postprocedural complication. The patient was escorted to have an upright chest radiograph. IMPRESSION: Technically successful CT guided core needle core biopsy of right lower lung mass. Ruthann Cancer, MD [facility] Electronically Signed   By: Ruthann Cancer M.D.   On: 01/10/2021 13:47    ASSESSMENT AND PLAN: 1.  Metastatic non-small cell lung cancer, squamous cell carcinoma 2.  Acute respiratory failure secondary to postobstructive pneumonia 3.  Pain secondary to bone lesions 4.  Hypercalcemia secondary to malignancy, resolved 5.  Hypertension 6.  Hyperlipidemia 7.  Pulmonary fibrosis  -The patient has a diagnosis of metastatic lung cancer.  He may benefit from systemic chemotherapy versus referral to palliative care/hospice.  We will need to determine his performance status to determine candidacy for chemotherapy/immunotherapy.  Would also be helpful to have molecular studies back to help with discussions.  Further recommendations per Dr. Burr Medico. -Scans reviewed.  He has multiple bone lesions.  May be reasonable to consider radiation oncology consultation for consideration of palliative radiation to these bone lesions. -Continue ongoing goals of care discussion per palliative care. -The patient and daughter wish to continue care under the care of Dr. Delton Coombes at [facility].  We can refer the patient back to Dr. Delton Coombes once he is discharged from the hospital for ongoing discussions regarding treatment options/goals of care.   LOS: 5 days   Mikey Bussing, DNP, AGPCNP-BC, AOCNP 01/14/21  Addendum  I have seen the patient, took his history and examined him independently. I agree with the assessment and and plan and have edited the notes.   I have personally reviewed pt's  scan images, lab and chart, and spoke with pt and his daughter at bed side in late afternoon today. I reviewed his incurable nature of his metastatic non-small cell lung cancer, overall very poor prognosis due to his diffuse metastasis, poor performance status, severe protein and calorie malnutrition.  I discussed the treatment option, including palliative radiation to his spinal metastasis, palliative chemo, targeted therapy and immunotherapy.  Due to the squamous cell carcinoma histology, there is unlikely targeted therapy available.  Given his overall poor condition, I do not think he is a candidate for cytotoxic chemotherapy.  I will check PD-L1 expression, to see if he is a candidate for single agent immunotherapy.  I recommend him to consider palliative radiation to spinal metastasis due to his significant back pain.  He previously established care with Dr. Tammi Klippel for his prostate cancer.  I will send a message to Dr. Tammi Klippel.  We also discussed palliative care and hospice alone since he is not a good candidate for cancer treatment.  Patient previously had head and neck cancer, and prostate cancer, so he is not ready to accept hospice at this point. His severe malnutrition and dysphagia is a big concern in his cancer treatment, he previously had feeding tube when he went through chemo and radiation for his head neck cancer.  This has been discussed by palliative medicine with patient and his daughter, and they wish to proceed.  I think that is reasonable.  We discussed CODE STATUS, and he agreed with DNR.  We discussed hospice after palliative RT if he does not improved with tube feeds and maximal supportive care.  He is open to that.  In summary, after lengthy discussion, patient and her daughter agrees and would like to proceed the following  -PEG tube placement for nutrition, will defer primary team to order  -  palliative RT to thoracic spinal mets. I will send a message to Dr. Tammi Klippel and see if he  can start radiation before discharge.  He would be transferred to [facility] if that is the plan.  -DNR  -palliative medicine team to continue manage his pain and other symptoms    I will request PD-L1 on his biopsy sample. We will f/u before his discharge to arrange his f/u with Dr. Delton Coombes  I spent a total of 60 minutes for his consult today.  Truitt Merle  01/14/2021

## 2021-01-14 NOTE — Progress Notes (Signed)
Pt saturations 89-97% on room air. Removed nasal canula and daughter concerned that oxygen saturations would be dropping as he was sleeping. Stated that she saw it drop to 79 for 30 seconds. I assured her we were monitoring his saturations and this was not a true pleth. Replaced nasal canula at 2 liters after speaking with daughter and palliative care team. Pt resting with call bell within reach.  Will continue to monitor.

## 2021-01-15 DIAGNOSIS — C3491 Malignant neoplasm of unspecified part of right bronchus or lung: Secondary | ICD-10-CM | POA: Diagnosis not present

## 2021-01-15 DIAGNOSIS — G893 Neoplasm related pain (acute) (chronic): Secondary | ICD-10-CM | POA: Diagnosis not present

## 2021-01-15 DIAGNOSIS — Z515 Encounter for palliative care: Secondary | ICD-10-CM | POA: Diagnosis not present

## 2021-01-15 DIAGNOSIS — Z7189 Other specified counseling: Secondary | ICD-10-CM | POA: Diagnosis not present

## 2021-01-15 LAB — BASIC METABOLIC PANEL
Anion gap: 9 (ref 5–15)
BUN: 5 mg/dL — ABNORMAL LOW (ref 8–23)
CO2: 25 mmol/L (ref 22–32)
Calcium: 7.9 mg/dL — ABNORMAL LOW (ref 8.9–10.3)
Chloride: 101 mmol/L (ref 98–111)
Creatinine, Ser: 0.57 mg/dL — ABNORMAL LOW (ref 0.61–1.24)
GFR, Estimated: >60 mL/min (ref >60)
Glucose, Bld: 110 mg/dL — ABNORMAL HIGH (ref 70–99)
Potassium: 3.4 mmol/L — ABNORMAL LOW (ref 3.5–5.1)
Sodium: 135 mmol/L (ref 135–145)

## 2021-01-15 MED ORDER — SODIUM CHLORIDE 0.9 % IV SOLN: INTRAVENOUS | Status: DC

## 2021-01-15 MED ORDER — POTASSIUM CHLORIDE CRYS ER 20 MEQ PO TBCR: 40.0000 meq | EXTENDED_RELEASE_TABLET | Freq: Once | ORAL | Status: DC

## 2021-01-15 MED ORDER — SODIUM CHLORIDE 0.9 % IV SOLN
INTRAVENOUS | Status: AC
  Filled 2021-01-15: qty 500

## 2021-01-15 MED ORDER — MORPHINE SULFATE (PF) 2 MG/ML IV SOLN
2.0000 mg | INTRAVENOUS | Status: DC | PRN
Start: 2021-01-15 — End: 2021-01-24
  Administered 2021-01-15 – 2021-01-24 (×11): 2 mg via INTRAVENOUS
  Filled 2021-01-15 (×11): qty 1

## 2021-01-15 NOTE — Progress Notes (Signed)
Palliative Care Progress Note, Assessment & Plan   Patient Name: Cody Carr       Date: 01/15/2021 DOB: 1946-03-08  Age: 75 y.o. MRN#: [id number] Attending Physician: Ezekiel Slocumb, DO Primary Care Physician: Monico Blitz, MD Admit Date: 01/08/2021  Reason for Consultation/Follow-up: Establishing goals of care  Subjective:  Patient is lying in bed in NAD. He is more alert today and reports better pain control.  He is able to participate in our discussion today  His daughter/[name] and wife are at bedside.    HPI: 75 y.o. male  with past medical history of throat cancer s/p chemo/radiation, prostate cancer s/p seeds, nephrolithiasis, hypertension, hyperlipidemia admitted on 01/08/2021 with constipation, back pain, poor po intake and concern for underlying lung cancer with concern for multiple sites of metastatic disease.   Goals of Care   I have reviewed medical records including EPIC notes, labs and imaging, assessed the patient and then met with patient, patient's wife and daughter [name],  to discuss diagnosis, prognosis, GOC, disposition and options.  Family verbalized their appreciation of oncology's visit yesterday.   Dr. Arbutus Ped also at the bedside. Plan is for patient to transfer to [facility] to be evaluated from radiation oncology and hopefully initiation of radiation therapy.  Hope is to initiate radiation therapy as recommended by Dr. Morey Hummingbird    Advance directives and concepts specific to code status and artificial feeding were considered and discussed. Education offered to patient/family the importance of continued conversation with family and the medical providers regarding overall plan of care and treatment options, ensuring decisions are within the context of the patient's values  and GOCs.     Questions and concerns were addressed. The family was encouraged to call with questions or concerns.     Prognosis:  Unable to determine, likely long term poor prognosis with patient's poor physical and functional  status  Discharge Planning: To Be Determined  Recommendations/Plan: DNR/DNI Pain management-   Continue Dexamethasone 8 mg IV every 8 hrs.  Utilize oral morphine/Roxanol as ordered, only utilize Morphine IV as rescue. Continue current treatment/treat the treatable,  family is hopeful for improvement Transfer to [facility] for initiation of radiation therapy as recommended by Dr. Morey Hummingbird Follow-up with Dr. Delton Coombes for outpatient oncology support   Care plan was discussed with Occupational therapy, speech therapy, patient and patient's daughter    Physical Exam Constitutional:      Appearance: He is ill-appearing.  HENT:     Head: Normocephalic.  Cardiovascular:     Rate and Rhythm: Normal rate.  Pulmonary:     Comments: Productive, wet cough Musculoskeletal:     Right lower leg: Edema present.     Left lower leg: Edema present.     Comments: Generalized weakness and muscle atrophy   Skin:    General: Skin is warm and dry.  Neurological:     Mental Status: He is alert. Mental status is at baseline.  Psychiatric:        Mood and Affect: Mood normal.            Vital Signs: BP (!) 151/74 (BP Location: Right Arm)   Pulse 80   Temp 98.1 F (  36.7 C) (Oral)   Resp 16   Wt 81.3 kg   SpO2 96%   BMI 23.65 kg/m  SpO2: SpO2: 96 % O2 Device: O2 Device: Nasal Cannula O2 Flow Rate: O2 Flow Rate (L/min): 2 L/min      Palliative Assessment/Data: 30%       Total Time 35 minutes Prolonged Time Billed  yes   Greater than 50%  of this time was spent counseling and coordinating care related to the above assessment and plan.  Thank you for allowing the Palliative Medicine Team to assist in the care of this patient.  Braselton Ilsa Iha,  FNP-BC Palliative Medicine Team Team Phone # [phone]

## 2021-01-15 NOTE — Progress Notes (Signed)
SLP Note  Patient Details Name: Cody Carr MRN: [id number] DOB: April 29, 1946  Consulted with team, including DO, Palliative Care, and RD. SLP will continue to follow for education to maximize swallow safety, but will defer plans for instrumental swallow evaluation at this time. Palliative Care assisting/supporting family re: facilitating establishment of appropriate GOC.   [name] B. Quentin Ore, [facility], [place] Speech Language Pathologist Office: [phone]  Shonna Chock 01/15/2021, 8:51 AM

## 2021-01-15 NOTE — Progress Notes (Signed)
Physical Therapy Treatment Patient Details Name: Cody Carr MRN: [id number] DOB: 1946/03/11 Today's Date: 01/15/2021   History of Present Illness Pt is a 75 y.o. male admitted 01/08/21 with progressive back pain, constipatino, poor PO intake. CT showed R pleural effusion, large necrotic-appearing mass in RLL that extends into diaphragm and liver. MRI showed metastatic masses on T1-T4, L1. 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 Plan for transfer to [facility] for evaluation from radiation oncology and potential initiation of radiation. PMH includes HTN, nephrolithiasis, prostate CA, throat CA s/p chemoradiation (per pt).   PT Comments    Pt demonstrates improved ability to mobilize bed-level and perform BLE therex, but continues to decline attempts at seated EOB activity secondary to pain. Reviewed educ on back precautions for comfort, as pt notes significant pain with any spinal twisting. Pt remains limited by pain, generalized weakness, decreased activity tolerance, and likely impaired balance. Pt notes plan to transfer to [facility] when bed available. If to remain admitted, will continue to follow acutely.  SpO2 stable on 4L O2 [place]    Recommendations for follow up therapy are one component of a multi-disciplinary discharge planning process, led by the attending physician.  Recommendations may be updated based on patient status, additional functional criteria and insurance authorization.  Follow Up Recommendations  Home health PT;Supervision/Assistance - 24 hour (pending progression)     Equipment Recommendations  Wheelchair (measurements PT);Wheelchair cushion (measurements PT);Hospital bed;Other (comment) (lift equipment; air mattress for hospital bed; roho cushion for w/c; high-back reclining w/c with elevating leg rests; non-emergency ambulance transport home)    Recommendations for Other Services       Precautions / Restrictions Precautions Precautions: Back;Fall;Other  (comment) Precaution Comments: Back precautions for comfort     Mobility  Bed Mobility Overal bed mobility: Needs Assistance Bed Mobility: Rolling Rolling: Min assist         General bed mobility comments: Multiple rolls R/L for linen change and pericare, pt required light minA for repositioning of hips    Transfers                 General transfer comment: Pt declined secondary to pain; educ on options for mobility to sitting EOB with decreased twisting of spine (i.e. log roll, max elevated HOB then scooting to edge, etc.)  Ambulation/Gait                 Stairs             Wheelchair Mobility    Modified Rankin (Stroke Patients Only)       Balance                                            Cognition Arousal/Alertness: Awake/alert Behavior During Therapy: Flat affect Overall Cognitive Status: No family/caregiver present to determine baseline cognitive functioning                                 General Comments: Pt with poor vocal quality and soft speech; following simple commands appropriately and able to make needs known. pt recognizes anxiety related to pain and attempts at sitting EOB      Exercises General Exercises - Lower Extremity Long Arc Quad: AROM;Both;Supine Heel Slides: AROM;Both;Supine Straight Leg Raises: AROM;Both;Supine (partial range)    General Comments General comments (skin  integrity, edema, etc.): Pt with improved ability to perform bed mobility; still not wanting to attempt sitting EOB or OOB activity secondary to pain, pt also endorses anxiety having not been OOB in so long. Pt notes plan to d/c to [facility] for potential tx when bed available      Pertinent Vitals/Pain Pain Assessment: Faces Faces Pain Scale: Hurts even more Pain Location: Back (especially with any twisting) Pain Descriptors / Indicators: Grimacing;Guarding Pain Intervention(s): Monitored during session;Limited activity  within patient's tolerance;Repositioned;Other (comment) (reinforced back prec for comfort)    Home Living                      Prior Function            PT Goals (current goals can now be found in the care plan section) Progress towards PT goals: Progressing toward goals (improved bed mobility, but have not progressed to EOB secondary to pain)    Frequency    Min 3X/week      PT Plan Current plan remains appropriate    Co-evaluation              AM-PAC PT "6 Clicks" Mobility   Outcome Measure  Help needed turning from your back to your side while in a flat bed without using bedrails?: A Lot Help needed moving from lying on your back to sitting on the side of a flat bed without using bedrails?: A Lot Help needed moving to and from a bed to a chair (including a wheelchair)?: Total Help needed standing up from a chair using your arms (e.g., wheelchair or bedside chair)?: Total Help needed to walk in hospital room?: Total Help needed climbing 3-5 steps with a railing? : Total 6 Click Score: 8    End of Session Equipment Utilized During Treatment: Oxygen Activity Tolerance: Patient limited by pain Patient left: in bed;with call bell/phone within reach;with bed alarm set;with nursing/sitter in room Nurse Communication: Mobility status PT Visit Diagnosis: Muscle weakness (generalized) (M62.81);Difficulty in walking, not elsewhere classified (R26.2);Pain     Time: 3159-4585 PT Time Calculation (min) (ACUTE ONLY): 20 min  Charges:  $Therapeutic Activity: 8-22 mins                     Mabeline Caras, PT, DPT Acute Rehabilitation Services  Pager [phone] Office [place] 01/15/2021, 6:01 PM

## 2021-01-15 NOTE — Progress Notes (Signed)
PROGRESS NOTE    Cody Carr   JQB:[id number]  DOB: Jun 20, 1945  PCP: Monico Blitz, MD    DOA: 01/08/2021 LOS: 6    Brief Narrative / Hospital Course to Date:   75 y.o. male, with history of nephrolithiasis, hyperlipidemia, hypertension, prostate cancer with seeds, throat cancer status post chemoradiation per patient, and more presented the [facility] ED with a chief complaint of back pain, constipation, and poor p.o. intake.  CT showed right-sided pleural effusion, large necrotic appearing mass in the posterior right lower lobe appearing to cross the right diaphragm extending into adjacent liver.  Imaging also revealed mediastinal lymphadenopathy, lytic lesions in the lumbar spine concerning for metastatic disease enhancing soft tissues mass in the left gluteal muscle, bilateral renal lesions.  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  Patient was seen by oncology.  Transferred to [facility] for IR to obtain tissue for biopsy.   Assessment & Plan   Principal Problem:   Non-small cell carcinoma of lung, stage 4, right (HCC) Active Problems:   Acute respiratory failure with hypoxia (HCC)   CAP (community acquired pneumonia)   Hypercalcemia   Malignant pleural effusion   Constipation   Protein-calorie malnutrition, moderate (HCC)   Pressure injury of skin   Protein-calorie malnutrition, severe   Acute respiratory failure with hypoxia secondary to postobstructive pneumonia versus atelectasis Patient continues to require 3 L/min nasal cannula oxygen and has productive cough.  He has been tried on room air and has borderline sats, drops during sleep.   --Supplement O2 to maintain sats above 90%, wean as tolerated --Completed antibiotics  --Continuous pulse ox --Pulmonary hygiene with I-S and flutter --Bronchodilators as needed --Home oxygen qualification - spO2 89-97 on room air, but reportedly pt desats during sleep  Stage IV non-small cell lung  cancer, newly diagnosed -primary right lower lobe lung mass with metastatic involvement of multiple lumbar and thoracic vertebrae, lumbar paraspinal soft tissue, left gluteus muscle, primary mass extension across diaphragm into liver, possibly spleen and kidneys.  No brain metastases seen on MRI. IR performed CT-guided lung biopsy 9/8. Pathology shows non-small cell carcinoma, squamous cell. --Oncology consulted, patient seen by Dr. Delton Coombes at [facility] and will follow up --Dr. Burr Medico met with patient and daughter at [facility] 9/2 --Transfer to [facility] for palliative spinal XRT --Palliative care following, appreciate assistance in [facility] talks --Pain control - ongoing titration of medications for adequate relief -- PO Roxanol, Decadron, Robaxin PRN, IV morphine only if oral meds not effective --Bowel regimen  Lumbar radiculopathy - due to lumbar foraminal stenosis related to tumor burden.   --Trial of Decadron --Consider low dose gabapentin if radicular pain in left leg persists.  Avoid if possible, his swallow issues worsen when sedated.  Malignant hypercalcemia -Resolved.  Most likely secondary to skeletal lytic lesions.   Treated with Zometa on 9/7 and aggressive IV hydration. Vitamin D level normal 55.3 on 01/13/21 Corrected Ca is 9.9 (9/13) --Off IV fluids.   --Monitor for hypocalcemia  Right pleural effusion, small - most likely malignant.   This does not seem to be impacting oxygenation, room air spO2 89-97%.  No thoracentesis needed at this time, monitor.  Community-acquired pneumonia vs postobstructive pneumonia/atelectasis -Completed course of Rocephin and Zithromax.  Mucinex.  Pulmonary hygiene.   Chest PT for difficulty clearing secretions.  Opioid Induced Constipation --Relistor given 9/10, 9/12 --suppository after pain medicine if no BM by tomorrow --other laxatives on hold w Relistor  Moderate  protein calorie malnutrition -encourage p.o. intake and continue  supplements.   --Consult dietitian  Hyperlipidemia - d/c statin per pt/family request  Hypertension -continue lisinopril.  As needed hydralazine for SBP> 160.  Have increased lisinopril dose due to BP's elevated. Daughter reports hx of pt's BP dropping too low with lisinopril in the past.  Monitor closely, once pain controlled, may need dose reduced again.    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  Further complicates lung malignancy and overall prognosis unfortunately.   Patient BMI: Body mass index is 23.65 kg/m.   DVT prophylaxis: heparin injection 5,000 Units Start: 01/11/21 0600 SCDs Start: 01/08/21 2224   Diet:  Diet Orders (From admission, onward)     Start     Ordered   01/14/21 1043  Diet full liquid Room service appropriate? Yes; Fluid consistency: Thin  Diet effective now       Question Answer Comment  Room service appropriate? Yes   Fluid consistency: Thin      01/14/21 1042              Code Status: Full Code   Subjective 01/15/21    Patient seen with daughter and wife at bedside, along with Palliative Care this AM.  After meeting with Dr. Burr Medico yesterday, pt would like to proceed with palliative radiation therapy to spine in hopes to improve his pain so that he can get stronger again.   Feeling much better with addition of steroids for nerve pain.   Daughter reports he did better with swallowing medication. Pt reports his pain is controlled.   Pt acknowledges his with to be DNR/DNI status. He does want a PEG tube to optimize his nutrition status.  Had one with prior throat cancer.     Disposition Plan & Communication   Status is: Inpatient  Remains inpatient appropriate because: pt to start palliative radiation to spine at [facility], awaiting available bed and transport.  Dispo: The patient is from: Home              Anticipated d/c is to: Home              Patient  currently is not medically stable to d/c.   Difficult to place patient No   Family Communication: Wife and daughter at bedside on rounds 9/8, 9/9, 9/10, 9/13.    Daughter present 9/11, 9/12.   Consults, Procedures, Significant Events   Consultants:  Interventional radiology Oncology Palliative care  Procedures:  CT-guided right lower lobe lung biopsy 01/11/2019  Antimicrobials:  Anti-infectives (From admission, onward)    Start     Dose/Rate Route Frequency Ordered Stop   01/09/21 2000  cefTRIAXone (ROCEPHIN) 2 g in sodium chloride 0.9 % 100 mL IVPB        2 g 200 mL/hr over 30 Minutes Intravenous Every 24 hours 01/08/21 2224 01/13/21 2018   01/09/21 2000  azithromycin (ZITHROMAX) 500 mg in sodium chloride 0.9 % 250 mL IVPB        500 mg 250 mL/hr over 60 Minutes Intravenous Every 24 hours 01/08/21 2224 01/13/21 2142   01/08/21 1930  cefTRIAXone (ROCEPHIN) 1 g in sodium chloride 0.9 % 100 mL IVPB        1 g 200 mL/hr over 30 Minutes Intravenous  Once 01/08/21 1929 01/08/21 2053   01/08/21 1930  azithromycin (ZITHROMAX) 500 mg in sodium chloride 0.9 % 250 mL IVPB  500 mg 250 mL/hr over 60 Minutes Intravenous  Once 01/08/21 1929 01/08/21 2219         Micro    Objective   Vitals:   01/15/21 0918 01/15/21 1130 01/15/21 1518 01/15/21 1548  BP:   126/70 126/70  Pulse: 80 82 83 80  Resp: _0 Temp:   98.6 F (37 C)   TempSrc:   Oral   SpO2: 96% 91% 100% 98%  Weight:        Intake/Output Summary (Last 24 hours) at 01/15/2021 1604 Last data filed at 01/14/2021 2147 Gross per 24 hour  Intake 150 ml  Output 550 ml  Net -400 ml   Filed Weights   01/13/21 0425 01/14/21 0403 01/15/21 0300  Weight: 83.1 kg 85.2 kg 81.3 kg    Physical Exam:  General exam: awake, appears comfortable, alert, no acute distress, underweight Respiratory system: on 3 L/min nasal cannula oxygen, normal respiratory effort Cardiovascular system: RRR, no pedal edema.   Central  nervous system: A&O x3. no gross focal neurologic deficits, normal speech but very poor vocal projection GI: non-distended abdomen Extremities: moves all, no edema, normal tone Psychiatry: normal mood, congruent affect, judgement and insight appear normal  Labs   Data Reviewed: I have personally reviewed following labs and imaging studies  CBC: Recent Labs  Lab 01/09/21 0522 01/09/21 1850 01/10/21 0126 01/11/21 0509 01/12/21 0114 01/13/21 0047 01/14/21 0131  WBC 21.3*   < > 19.0* 21.0* 23.5* 23.1* 22.7*  NEUTROABS 19.0*  --   --   --   --   --   --   HGB 10.7*   < > 9.3* 9.2* 9.4* 9.0* 8.6*  HCT 35.6*   < > 29.8* 30.1* 30.7* 29.4* 28.0*  MCV 88.3   < > 87.4 88.5 87.2 88.0 88.3  PLT 535*   < > 407* 423* 456* 386 349   < > = values in this interval not displayed.   Basic Metabolic Panel: Recent Labs  Lab 01/10/21 0126 01/11/21 0509 01/12/21 0114 01/13/21 0047 01/14/21 0131 01/15/21 0137  NA 137 140 141 139 138 135  K 3.3* 3.1* 3.2* 3.5 3.8 3.4*  CL 105 104 108 106 105 101  CO2 _1 GLUCOSE 81 74 70 88 93 110*  BUN _2 7* 5*  CREATININE 0.70 0.71 0.62 0.58* 0.57* 0.57*  CALCIUM 10.4* 10.1 9.0 8.3* 8.1* 7.9*  MG 1.9 1.9 1.8 1.8 1.8  --    GFR: Estimated Creatinine Clearance: 90.2 mL/min (A) (by C-G formula based on SCr of 0.57 mg/dL (L)). Liver Function Tests: Recent Labs  Lab 01/09/21 0522 01/10/21 0126 01/11/21 0509 01/13/21 0047  AST 15 14* 13* 17  ALT _3 ALKPHOS 157* 124 118 120  BILITOT 1.0 1.2 1.5* 1.2  PROT 5.3* 4.7* 4.7* 4.5*  ALBUMIN 2.0* 1.6* 1.6* 1.5*   No results for input(s): LIPASE, AMYLASE in the last 168 hours.  No results for input(s): AMMONIA in the last 168 hours. Coagulation Profile: Recent Labs  Lab 01/10/21 0804  INR 1.5*   Cardiac Enzymes: No results for input(s): CKTOTAL, CKMB, CKMBINDEX, TROPONINI in the last 168 hours. BNP (last 3 results) No results for input(s): PROBNP in the last 8760  hours. HbA1C: No results for input(s): HGBA1C in the last 72 hours. CBG: No results for input(s): GLUCAP in the last 168 hours. Lipid Profile: No results for input(s): CHOL, HDL,  LDLCALC, TRIG, CHOLHDL, LDLDIRECT in the last 72 hours. Thyroid Function Tests: No results for input(s): TSH, T4TOTAL, FREET4, T3FREE, THYROIDAB in the last 72 hours.  Anemia Panel: No results for input(s): VITAMINB12, FOLATE, FERRITIN, TIBC, IRON, RETICCTPCT in the last 72 hours. Sepsis Labs: Recent Labs  Lab 01/09/21 0857  PROCALCITON 0.30    Recent Results (from the past 240 hour(s))  SARS CORONAVIRUS 2 (TAT 6-24 HRS) Nasopharyngeal Nasopharyngeal Swab     Status: None   Collection Time: 01/08/21  9:27 PM   Specimen: Nasopharyngeal Swab  Result Value Ref Range Status   SARS Coronavirus 2 NEGATIVE NEGATIVE Final    Comment: (NOTE) SARS-CoV-2 target nucleic acids are NOT DETECTED.  The SARS-CoV-2 RNA is generally detectable in upper and lower respiratory specimens during the acute phase of infection. Negative results do not preclude SARS-CoV-2 infection, do not rule out co-infections with other pathogens, and should not be used as the sole basis for treatment or other patient management decisions. Negative results must be combined with clinical observations, patient history, and epidemiological information. The expected result is Negative.  Fact Sheet for Patients: SugarRoll.be  Fact Sheet for Healthcare Providers: https://www.woods-mathews.com/  This test is not yet approved or cleared by the [place] FDA and  has been authorized for detection and/or diagnosis of SARS-CoV-2 by FDA under an Emergency Use Authorization (EUA). This EUA will remain  in effect (meaning this test can be used) for the duration of the COVID-19 declaration under Se ction 564(b)(1) of the Act, 21 U.S.C. section 360bbb-3(b)(1), unless the authorization is terminated  or revoked sooner.  Performed at [facility], [place] [address] [place], [place] [address]       Imaging Studies   No results found.   Medications   Scheduled Meds:  (feeding supplement) PROSource Plus  30 mL Oral BID BM   dexamethasone (DECADRON) injection  8 mg Intravenous Q8H   feeding supplement  1 Container Oral TID BM   guaiFENesin-dextromethorphan  5 mL Oral Q6H   heparin  5,000 Units Subcutaneous Q8H   lisinopril  10 mg Oral QPM   mouth rinse  15 mL Mouth Rinse BID   multivitamin with minerals  1 tablet Oral Daily   Continuous Infusions:  methocarbamol (ROBAXIN) IV         LOS: 6 days    Time spent: 35 minutes with greater than 50% spent at bedside and coordinating care    Ezekiel Slocumb, DO [facility]  01/15/2021, 4:04 PM      If 7PM-7AM, please contact night-coverage. How to contact the [facility] Attending or Consulting provider [place] or covering provider during after hours [place], for this patient?    Check the care team in [facility] and look for a) attending/consulting [facility] provider listed and b) the [facility] team listed Log into [email or web address] and use [place]'s universal password to access. If you do not have the password, please contact the hospital operator. Locate the [facility] provider you are looking for under [facility] and page to a number that you can be directly reached. If you still have difficulty reaching the provider, please page the [facility] (Director on Call) for the Hospitalists listed on amion for assistance.

## 2021-01-16 DIAGNOSIS — J9601 Acute respiratory failure with hypoxia: Secondary | ICD-10-CM | POA: Diagnosis not present

## 2021-01-16 DIAGNOSIS — J189 Pneumonia, unspecified organism: Secondary | ICD-10-CM | POA: Diagnosis not present

## 2021-01-16 DIAGNOSIS — C3491 Malignant neoplasm of unspecified part of right bronchus or lung: Secondary | ICD-10-CM | POA: Diagnosis not present

## 2021-01-16 DIAGNOSIS — R918 Other nonspecific abnormal finding of lung field: Secondary | ICD-10-CM

## 2021-01-16 LAB — BASIC METABOLIC PANEL
Anion gap: 8 (ref 5–15)
BUN: 11 mg/dL (ref 8–23)
CO2: 25 mmol/L (ref 22–32)
Calcium: 7.9 mg/dL — ABNORMAL LOW (ref 8.9–10.3)
Chloride: 101 mmol/L (ref 98–111)
Creatinine, Ser: 0.56 mg/dL — ABNORMAL LOW (ref 0.61–1.24)
GFR, Estimated: >60 mL/min (ref >60)
Glucose, Bld: 146 mg/dL — ABNORMAL HIGH (ref 70–99)
Potassium: 3.9 mmol/L (ref 3.5–5.1)
Sodium: 134 mmol/L — ABNORMAL LOW (ref 135–145)

## 2021-01-16 LAB — CBC
HCT: 31.8 % — ABNORMAL LOW (ref 39.0–52.0)
Hemoglobin: 10 g/dL — ABNORMAL LOW (ref 13.0–17.0)
MCH: 27.1 pg (ref 26.0–34.0)
MCHC: 31.4 g/dL (ref 30.0–36.0)
MCV: 86.2 fL (ref 80.0–100.0)
Platelets: 468 10*3/uL — ABNORMAL HIGH (ref 150–400)
RBC: 3.69 MIL/uL — ABNORMAL LOW (ref 4.22–5.81)
RDW: 16.7 % — ABNORMAL HIGH (ref 11.5–15.5)
WBC: 29.7 10*3/uL — ABNORMAL HIGH (ref 4.0–10.5)
nRBC: 0 % (ref 0.0–0.2)

## 2021-01-16 MED ORDER — ALUM & MAG HYDROXIDE-SIMETH 200-200-20 MG/5ML PO SUSP
30.0000 mL | ORAL | Status: DC | PRN
  Administered 2021-01-16 – 2021-01-19 (×2): 30 mL via ORAL
  Filled 2021-01-16 (×2): qty 30

## 2021-01-16 MED ORDER — ONDANSETRON 4 MG PO TBDP: 4.0000 mg | ORAL_TABLET | Freq: Four times a day (QID) | ORAL | Status: DC | PRN

## 2021-01-16 NOTE — Care Management Important Message (Signed)
Important Message  Patient Details  Name: Cody Carr MRN: [id number] Date of Birth: 11/15/1945   Medicare Important Message Given:  Yes     Ahlijah Raia [address] 01/16/2021, 3:57 PM

## 2021-01-16 NOTE — Progress Notes (Signed)
This chaplain is present with the Pt., Pt. daughter-[name], notary, and witnesses for the notarizing of the Pt. AD: HCPOA and Living Will.    The Pt. names Cody Carr as his healthcare agent.  If the healthcare agent is unwilling or unable to serve the the Pt. names Cody Carr.  The chaplain gave the Pt. the original AD along with two copies.  The chaplain scanned the Pt. AD into EMR.  This chaplain is available for F/U spiritual care as needed.

## 2021-01-16 NOTE — Progress Notes (Signed)
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   The patient presented to [facility] with complaints of poor oral intake, back pain and also constipation.  CT scan revealed a large necrotic appearing mass in the posterior right lower lobe appearing to cross the right diaphragm and extending into the liver in addition to a right-sided pleural effusion, mediastinal lymphadenopathy, lytic lesions in the lumbar spine concerning for metastatic disease, enhancing soft tissue masses and the left gluteal muscle and bilateral renal lesions.  He was was evaluated by oncology and subsequently transferred to [facility] for IR biopsy which was performed on 9/8.  Work-up has revealed stage IV non-small cell cancer. After further discussions with oncology, it was decided that the patient required palliative spinal radiation which would be performed at [facility]. Hospital course complicated by malignant hypercalcemia which has improved with treatment  Subjective: The patient has complaints of nausea but he is able to drink both clear and full liquids without exacerbation of nausea.  His daughter is concerned about GERD and states he takes Tums frequently at home.  She is asking if he needs Protonix.  Upon further discussion with the patient, it appears that he is not having any trouble with reflux or heartburn.    Assessment & Plan:   Principal Problem:   Non-small cell carcinoma of lung, stage 4,  with acute hypoxic respiratory failure , right malignant pleural effusion, soft tissue and lytic mets, metastatic mediastinal lymphadenopathy and direct invasion of the liver -Right lung mass pathology report  from 9/8 reveals> Poorly differentiated non-small cell carcinoma associated with  extensive necrosis.  - Continue supplemental oxygen to keep pulse ox greater than 90% - Appreciate oncology and palliative care assistance- transitioned to  DNR -Per oncology, the patient is not a good candidate for treatment especially considering his underlying dysphagia and severe malnutrition --He is being transferred to [facility] today for palliative spinal radiation to multilevel spinal mets -Pain control with liquid oral morphine and IV morphine - Also receiving Decadron & PRN Robaxin   Active Problems:   Post obstructive vs community acquired pneumonia - He has completed a course of ceftriaxone and azithromycin   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 - Secondary to lytic lesions in spine - Corrected calcium was 13.7 on 9/6 - Given Zometa on 9/7 - Treated with aggressive IV fluids    Opoid related Constipation-severe - Required Relistor on 9/6 and again on 9/10  Leukocytosis - Secondary to cancer and steroids -WBC count was 22.9 on 9/6 and is now 29.7   Dysphagia-history of cancer of base of tongue - Evaluated by SLP who recommended further work up with barium swallow and FEES -at this time he is tolerating a full liquid diet -PEG tube remains an option (for palliative purposes) and will need to be discussed further once fully assessed by SLP  Radiculopathy - Masses encroaching on T3-T4 neural foramina - Lytic lesions in the L-spine bodies also noted - has improved with Decadron  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 - seen on CTA    Protein-calorie malnutrition, severe-hypoalbuminemia -Continue dietary supplements  Hypokalemia - Being replaced-potassium is 3.9 today  Essential hypertension - Continue lisinopril and as needed hydralazine  Anemia of chronic disease - Continue to follow intermittently  History of prostate cancer with radioactive seed implantation 3/22 -Last PSA was  2.6 on 10/31/2020  History of nephrolithiasis status post ESWL   Time spent in minutes: 40 DVT prophylaxis: heparin injection 5,000 Units Start: 01/11/21 0600 SCDs Start: 01/08/21 2224  Code Status: DNR Family Communication: daughter  Level of Care: Level of care: Med-Surg Disposition Plan:  Status is: Inpatient  Remains inpatient appropriate because:Inpatient level of care appropriate due to severity of illness  Dispo: The patient is from: Home              Anticipated d/c is to: SNF              Patient currently is not medically stable to d/c.   Difficult to place patient No      Consultants:  Oncology IR Palliative care Procedures:  IR biopsy Antimicrobials:  Anti-infectives (From admission, onward)    Start     Dose/Rate Route Frequency Ordered Stop   01/09/21 2000  cefTRIAXone (ROCEPHIN) 2 g in sodium chloride 0.9 % 100 mL IVPB        2 g 200 mL/hr over 30 Minutes Intravenous Every 24 hours 01/08/21 2224 01/13/21 2018   01/09/21 2000  azithromycin (ZITHROMAX) 500 mg in sodium chloride 0.9 % 250 mL IVPB        500 mg 250 mL/hr over 60 Minutes Intravenous Every 24 hours 01/08/21 2224 01/13/21 2142   01/08/21 1930  cefTRIAXone (ROCEPHIN) 1 g in sodium chloride 0.9 % 100 mL IVPB        1 g 200 mL/hr over 30 Minutes Intravenous  Once 01/08/21 1929 01/08/21 2053   01/08/21 1930  azithromycin (ZITHROMAX) 500 mg in sodium chloride 0.9 % 250 mL IVPB        500 mg 250 mL/hr over 60 Minutes Intravenous  Once 01/08/21 1929 01/08/21 2219        Objective: Vitals:   01/16/21 1143 01/16/21 1150 01/16/21 1528 01/16/21 1540  BP: (!) 150/91 (!) 150/91 (!) 154/86   Pulse: 67 70 69   Resp: 16 (!) 23 18   Temp: 97.9 F (36.6 C)  (!) 97.5 F (36.4 C)   TempSrc: Oral  Oral   SpO2: 99% 99% 100%   Weight:    80.3 kg  Height:    6\' 1"  (1.854 m)    Intake/Output Summary (Last 24 hours) at 01/16/2021 1725 Last data filed at 01/16/2021 1144 Gross per 24 hour  Intake 543  ml  Output 1125 ml  Net -582 ml   Filed Weights   01/15/21 0300 01/16/21 0500 01/16/21 1540  Weight: 81.3 kg 81.3 kg 80.3 kg    Examination: General exam: Appears comfortable  HEENT: PERRLA, oral mucosa moist, no sclera icterus or thrush Respiratory system: Clear to auscultation. Respiratory effort normal. Cardiovascular system: S1 & S2 heard, RRR.   Gastrointestinal system: Abdomen soft, non-tender, nondistended. Normal bowel sounds. Central nervous system: Alert and oriented. No focal neurological deficits. Extremities: No cyanosis, clubbing or edema Skin: No rashes or ulcers Psychiatry:  Mood & affect appropriate.     Data Reviewed: I have personally reviewed following labs and imaging studies  CBC: Recent Labs  Lab 01/11/21 0509 01/12/21 0114 01/13/21 0047 01/14/21 0131 01/16/21 0243  WBC 21.0* 23.5* 23.1* 22.7* 29.7*  HGB 9.2* 9.4* 9.0* 8.6* 10.0*  HCT 30.1* 30.7* 29.4* 28.0* 31.8*  MCV 88.5 87.2 88.0 88.3 86.2  PLT 423* 456*  386 349 295*   Basic Metabolic Panel: Recent Labs  Lab 01/10/21 0126 01/11/21 0509 01/12/21 0114 01/13/21 0047 01/14/21 0131 01/15/21 0137 01/16/21 0243  NA 137 140 141 139 138 135 134*  K 3.3* 3.1* 3.2* 3.5 3.8 3.4* 3.9  CL 105 104 108 106 105 101 101  CO2 26 25 24 25 26 25 25   GLUCOSE 81 74 70 88 93 110* 146*  BUN 17 15 12 9  7* 5* 11  CREATININE 0.70 0.71 0.62 0.58* 0.57* 0.57* 0.56*  CALCIUM 10.4* 10.1 9.0 8.3* 8.1* 7.9* 7.9*  MG 1.9 1.9 1.8 1.8 1.8  --   --    GFR: Estimated Creatinine Clearance: 90.2 mL/min (A) (by C-G formula based on SCr of 0.56 mg/dL (L)). Liver Function Tests: Recent Labs  Lab 01/10/21 0126 01/11/21 0509 01/13/21 0047  AST 14* 13* 17  ALT 15 15 17   ALKPHOS 124 118 120  BILITOT 1.2 1.5* 1.2  PROT 4.7* 4.7* 4.5*  ALBUMIN 1.6* 1.6* 1.5*   No results for input(s): LIPASE, AMYLASE in the last 168 hours. No results for input(s): AMMONIA in the last 168 hours. Coagulation Profile: Recent Labs   Lab 01/10/21 0804  INR 1.5*   Cardiac Enzymes: No results for input(s): CKTOTAL, CKMB, CKMBINDEX, TROPONINI in the last 168 hours. BNP (last 3 results) No results for input(s): PROBNP in the last 8760 hours. HbA1C: No results for input(s): HGBA1C in the last 72 hours. CBG: No results for input(s): GLUCAP in the last 168 hours. Lipid Profile: No results for input(s): CHOL, HDL, LDLCALC, TRIG, CHOLHDL, LDLDIRECT in the last 72 hours. Thyroid Function Tests: No results for input(s): TSH, T4TOTAL, FREET4, T3FREE, THYROIDAB in the last 72 hours. Anemia Panel: No results for input(s): VITAMINB12, FOLATE, FERRITIN, TIBC, IRON, RETICCTPCT in the last 72 hours. Urine analysis:    Component Value Date/Time   COLORURINE YELLOW 01/08/2021 1615   APPEARANCEUR CLEAR 01/08/2021 1615   APPEARANCEUR Clear 08/09/2020 1457   LABSPEC 1.025 01/08/2021 1615   PHURINE 6.0 01/08/2021 1615   GLUCOSEU NEGATIVE 01/08/2021 1615   HGBUR NEGATIVE 01/08/2021 1615   [place] 01/08/2021 1615   BILIRUBINUR Negative 08/09/2020 [place] 01/08/2021 1615   PROTEINUR NEGATIVE 01/08/2021 1615   NITRITE NEGATIVE 01/08/2021 1615   [place] 01/08/2021 1615   Sepsis Labs: @LABRCNTIP (procalcitonin:4,lacticidven:4) ) Recent Results (from the past 240 hour(s))  SARS CORONAVIRUS 2 (TAT 6-24 HRS) Nasopharyngeal Nasopharyngeal Swab     Status: None   Collection Time: 01/08/21  9:27 PM   Specimen: Nasopharyngeal Swab  Result Value Ref Range Status   SARS Coronavirus 2 NEGATIVE NEGATIVE Final    Comment: (NOTE) SARS-CoV-2 target nucleic acids are NOT DETECTED.  The SARS-CoV-2 RNA is generally detectable in upper and lower respiratory specimens during the acute phase of infection. Negative results do not preclude SARS-CoV-2 infection, do not rule out co-infections with other pathogens, and should not be used as the sole basis for treatment or other patient management  decisions. Negative results must be combined with clinical observations, patient history, and epidemiological information. The expected result is Negative.  Fact Sheet for Patients: SugarRoll.be  Fact Sheet for Healthcare Providers: https://www.woods-mathews.com/  This test is not yet approved or cleared by the [place] FDA and  has been authorized for detection and/or diagnosis of SARS-CoV-2 by FDA under an Emergency Use Authorization (EUA). This EUA will remain  in effect (meaning this test can be used) for the duration of the COVID-19  declaration under Se ction 564(b)(1) of the Act, 21 U.S.C. section 360bbb-3(b)(1), unless the authorization is terminated or revoked sooner.  Performed at [facility], [place] [address] [place], [place] [address]          Radiology Studies: No results found.    Scheduled Meds:  (feeding supplement) PROSource Plus  30 mL Oral BID BM   dexamethasone (DECADRON) injection  8 mg Intravenous Q8H   feeding supplement  1 Container Oral TID BM   guaiFENesin-dextromethorphan  5 mL Oral Q6H   heparin  5,000 Units Subcutaneous Q8H   lisinopril  10 mg Oral QPM   mouth rinse  15 mL Mouth Rinse BID   multivitamin with minerals  1 tablet Oral Daily   Continuous Infusions:  methocarbamol (ROBAXIN) IV       LOS: 7 days      Debbe Odea, MD [facility] Pager: [email or web address] 01/16/2021, 5:25 PM

## 2021-01-16 NOTE — Progress Notes (Signed)
Report given to [facility] on [place] at [facility].

## 2021-01-16 NOTE — Progress Notes (Signed)
SLP Cancellation Note  Patient Details Name: Cody Carr MRN: [id number] DOB: July 18, 1945   Cancelled treatment:       Attempted to see pt for assessment of diet tolerance and continued education. Pt is currently in process of transferring to [facility]. SLP will follow up there.  Mikale Silversmith B. Quentin Ore, [facility], [place] Speech Language Pathologist Office: [phone]  Shonna Chock 01/16/2021, 2:31 PM

## 2021-01-16 NOTE — Progress Notes (Signed)
This chaplain responded to PMT referral for F/U on the Pt. Advance Directive.  The Pt. is awake and willing to participate in an AD review.  The Pt. daughter-[name] is at the bedside.  The Pt. has filled out the document and is waiting for a notary for notarizing of the Pt. AD:  HCPOA and Living Will.  The chaplain is scheduling an appointment for a notary today.  If the Pt. is discharged to [facility] before the notary is present, the Pt. and family will pursue notary services at [facility].  The Pt. daughter-[name] is holding the Pt. filled out AD.  The chaplain understands from communication with the Pt., the Pt. daughter-[name] Streetman will be named the Pt. healthcare agent.  This chaplain is available for F/U spiritual care as needed.  [place] [phone]

## 2021-01-17 ENCOUNTER — Ambulatory Visit: Payer: Medicare PPO | Admitting: Urology

## 2021-01-17 ENCOUNTER — Ambulatory Visit
Admit: 2021-01-17 | Discharge: 2021-01-17 | Disposition: A | Discharge status: Home / Self Care | Payer: Medicare PPO | Attending: Radiation Oncology | Admitting: Radiation Oncology

## 2021-01-17 DIAGNOSIS — C7801 Secondary malignant neoplasm of right lung: Secondary | ICD-10-CM | POA: Insufficient documentation

## 2021-01-17 DIAGNOSIS — C3491 Malignant neoplasm of unspecified part of right bronchus or lung: Secondary | ICD-10-CM

## 2021-01-17 DIAGNOSIS — E440 Moderate protein-calorie malnutrition: Secondary | ICD-10-CM | POA: Diagnosis not present

## 2021-01-17 DIAGNOSIS — C7951 Secondary malignant neoplasm of bone: Secondary | ICD-10-CM

## 2021-01-17 LAB — BASIC METABOLIC PANEL
Anion gap: 9 (ref 5–15)
BUN: 15 mg/dL (ref 8–23)
CO2: 26 mmol/L (ref 22–32)
Calcium: 7.9 mg/dL — ABNORMAL LOW (ref 8.9–10.3)
Chloride: 100 mmol/L (ref 98–111)
Creatinine, Ser: 0.49 mg/dL — ABNORMAL LOW (ref 0.61–1.24)
GFR, Estimated: >60 mL/min (ref >60)
Glucose, Bld: 131 mg/dL — ABNORMAL HIGH (ref 70–99)
Potassium: 4 mmol/L (ref 3.5–5.1)
Sodium: 135 mmol/L (ref 135–145)

## 2021-01-17 MED ORDER — POLYETHYLENE GLYCOL 3350 17 G PO PACK: 17.0000 g | PACK | ORAL | Status: DC | PRN

## 2021-01-17 MED ORDER — SENNA 8.6 MG PO TABS
1.0000 | ORAL_TABLET | Freq: Every day | ORAL | Status: DC | PRN
  Administered 2021-01-17: 8.6 mg via ORAL
  Filled 2021-01-17: qty 1

## 2021-01-17 NOTE — Progress Notes (Signed)
  Radiation Oncology         [phone] ________________________________  Name: Cody Carr MRN: [id number]  Date: 01/17/2021  DOB: [birth year]  INPATIENT  SIMULATION AND TREATMENT PLANNING NOTE    ICD-10-CM   1. 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  NARRATIVE:  The patient was brought to the [facility].  Identity was confirmed.  All relevant records and images related to the planned course of therapy were reviewed.  The patient freely provided informed written consent to proceed with treatment after reviewing the details related to the planned course of therapy. The consent form was witnessed and verified by the simulation staff.  Then, the patient was set-up in a stable reproducible  supine position for radiation therapy.  CT images were obtained.  Surface markings were placed.  The CT images were loaded into the planning software.  Then the target and avoidance structures were contoured.  Treatment planning then occurred.  The radiation prescription was entered and confirmed.  Then, I designed and supervised the construction of a total of several medically necessary complex treatment devices including MLCs to shield lungs and kidneys, specified in the radiation plan in ARIA.  I have requested : 3D Simulation  I have requested a DVH of the following structures: kidneys lungs and spinal cord.  PLAN:  The patient will receive 20 Gy in 4 fractions to T1-T2 and also L1   ________________________________  Sheral Apley. Tammi Klippel, M.D.

## 2021-01-17 NOTE — Progress Notes (Signed)
Palliative Care Progress Note, Assessment & Plan   Patient Name: Cody Carr       Date: 01/17/2021 DOB: 09/04/1945  Age: 75 y.o. MRN#: [id number] Attending Physician: Little Ishikawa, MD Primary Care Physician: Monico Blitz, MD Admit Date: 01/08/2021  Reason for Consultation/Follow-up: Establishing goals of care  Subjective: Patient is awake alert resting in bed, daughter [name] and wife are at bedside, he is scheduled to go downstairs for radiation soon. Denies any complaints at present, daughter has questions about setting up supplemental O2 at home upon discharge, we talked about it for a few minutes. Patient has completed a MOST form outlining his choices, this was scanned into Vynca by the undersigned.     HPI: 75 y.o. male  with past medical history of throat cancer s/p chemo/radiation, prostate cancer s/p seeds, nephrolithiasis, hypertension, hyperlipidemia admitted on 01/08/2021 with constipation, back pain, poor po intake and concern for underlying lung cancer with concern for multiple sites of metastatic disease.   Plan of Care: I have reviewed medical records including EPIC notes, labs and imaging, assessed the patient and then met with patient, patient's daughter [name], Occupational therapist Earnest Bailey and Speech therapist Early Chars  working with patient, to discuss diagnosis, prognosis, GOC, EOL wishes, disposition and options.   Continue radiation, continue current scope of care.   Questions and concerns were addressed. The family was encouraged to call with questions or concerns.   Code Status: Now DNR. Agree.    Prognosis:  Unable to determine, likely long term poor prognosis with patient's poor physical and functional  status  Discharge Planning: To Be  Determined  Recommendations/Plan: Agree with DNR. Continue current pain and non pain symptom management measures, discussed with patient and daughter, also discussed in detail with PMT colleague Ms Davis Gourd NP.  Monitor PO intake.  Continue radiation.  MOST form completed.   Care plan was discussed with wife patient and patient's daughter  Length of Stay: 8  Awake alert Appears frail and chronically ill No distress Has supplemental O2, currently at 5 L [place] Is thin with muscle wasting Regular work of breathing not labored.           Vital Signs: BP 136/82   Pulse 64   Temp 98 F (36.7 C) (Oral)   Resp 15   Ht $R'6\' 1"'CA$  (1.854 m)   Wt 80.2 kg   SpO2 100%   BMI 23.33 kg/m  SpO2: SpO2: 100 % O2 Device: O2 Device: Nasal Cannula O2 Flow Rate: O2 Flow Rate (L/min): 4 L/min      Palliative Assessment/Data: 40%       Total Time 25 minutes Prolonged Time Billed No   Greater than 50%  of this time was spent counseling and coordinating care related to the above assessment and plan.  Thank you for allowing the Palliative Medicine Team to assist in the care of this patient.  Loistine Chance MD.  Palliative Medicine Team Team Phone # [phone] from 7 AM to 7 PM only. After 7 PM, please contact primary service.

## 2021-01-17 NOTE — Progress Notes (Signed)
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    ICD-10-CM   1. Metastatic lung carcinoma, right (HCC)  C78.01       HISTORY OF PRESENT ILLNESS: Cody Carr is a 75 y.o. male seen at the request of Dr. Burr Medico.  He has a history of nephrolithiasis, hyperlipidemia, hypertension, prostate cancer with seeds, throat cancer status post chemoradiation.  He presented the ED with a chief complaint of back pain, constipation, and poor p.o. intake.  Patient has hoarseness of voice causing him to only whisper.  In ED, chest CT showed small right-sided pleural effusion with large necrotic appearing mass in the posterior medial right lower lobe that appears to transverse with the right diaphragm and extends into the adjacent liver.  Lytic lesion with associated soft tissue mass at L1 and additional lytic mass at L5.  Metastatic disease 5.5 cm heterogeneous enhancing soft tissue mass in the left gluteal muscle. On 9/8, he had CT guided right lower lobe lung mass biopsy revealing Poorly differentiated non-small cell carcinoma associated with extensive necrosis.  Currently, the patient is experiencing severe pain from an L1 met and a T1-T2 met in the spine.  PREVIOUS RADIATION THERAPY: Yes  07/27/20:  Insertion of radioactive I-125 seeds into the prostate gland; 145 Gy, definitive therapy with placement of SpaceOAR gel.  PAST MEDICAL HISTORY:  Past Medical History:  Diagnosis Date   Dyspnea    with exercise   History of kidney stones    Hyperlipemia    Hypertension    Prostate cancer ([place])    Throat cancer ([place]) 2008   treated with Chemo and Radiation       PAST SURGICAL HISTORY: Past Surgical History:  Procedure Laterality Date   dental implants  2008   EXTRACORPOREAL SHOCK WAVE LITHOTRIPSY Right 03/06/2020   Procedure: EXTRACORPOREAL SHOCK WAVE LITHOTRIPSY (ESWL);  Surgeon: Cleon Gustin, MD;  Location: [facility];  Service: Urology;  Laterality: Right;   EYE SURGERY Bilateral 2021   ioc lens for cataracts   RADIOACTIVE SEED IMPLANT N/A 07/27/2020   Procedure: RADIOACTIVE SEED IMPLANT/BRACHYTHERAPY IMPLANT;  Surgeon: Irine Seal, MD;  Location: [facility];  Service: Urology;  Laterality: N/A;  63 SEEDS   SPACE OAR INSTILLATION N/A 07/27/2020   Procedure: SPACE OAR INSTILLATION;  Surgeon: Irine Seal, MD;  Location: [facility];  Service: Urology;  Laterality: N/A;   throat biopsy  2008    FAMILY HISTORY:  Family History  Problem Relation Age of Onset   Prostate cancer Neg Hx    Breast cancer Neg Hx    Colon cancer Neg Hx    Pancreatic cancer Neg Hx     SOCIAL HISTORY:  Social History   Socioeconomic History   Marital status: Married    Spouse name: Not on file   Number of children: 1   Years of education: Not on file   Highest education level: Not on file  Occupational History   Not on file  Tobacco Use   Smoking status: Former    Packs/day: 0.50    Years: 12.00    Pack years: 6.00  Types: Cigarettes    Quit date: 1980    Years since quitting: 42.7   Smokeless tobacco: Never  Vaping Use   Vaping Use: Never used  Substance and Sexual Activity   Alcohol use: Not Currently   Drug use: Never   Sexual activity: Not Currently    Comment: erectile dysfunction  Other Topics Concern   Not on file  Social History Narrative   Not on file   Social Determinants of Health   Financial Resource Strain: Not on file  Food Insecurity: Not on file  Transportation Needs: Not on file  Physical Activity: Not on file  Stress: Not on file  Social Connections: Not on file  Intimate Partner  Violence: Not on file    ALLERGIES: Patient has no known allergies.  MEDICATIONS:  No current facility-administered medications for this encounter.   No current outpatient medications on file.   Facility-Administered Medications Ordered in Other Encounters  Medication Dose Route Frequency Provider Last Rate Last Admin   (feeding supplement) PROSource Plus liquid 30 mL  30 mL Oral BID BM Nicole Kindred A, DO   30 mL at 01/17/21 1340   acetaminophen (TYLENOL) tablet 650 mg  650 mg Oral Q6H PRN Chotiner, Yevonne Aline, MD   650 mg at 01/10/21 2210   alum & mag hydroxide-simeth (MAALOX/MYLANTA) 200-200-20 MG/5ML suspension 30 mL  30 mL Oral Q4H PRN Patrecia Pour, MD   30 mL at 01/16/21 2146   bisacodyl (DULCOLAX) suppository 10 mg  10 mg Rectal Daily PRN Amin, Ankit Chirag, MD       dexamethasone (DECADRON) injection 8 mg  8 mg Intravenous Q8H Knox Royalty, NP   8 mg at 01/17/21 1334   feeding supplement (BOOST / RESOURCE BREEZE) liquid 1 Container  1 Container Oral TID BM Nicole Kindred A, DO   1 Container at 01/17/21 0952   guaiFENesin-dextromethorphan (ROBITUSSIN DM) 100-10 MG/5ML syrup 5 mL  5 mL Oral Q6H Nicole Kindred A, DO   5 mL at 01/17/21 1733   heparin injection 5,000 Units  5,000 Units Subcutaneous Q8H Monia Sabal, PA-C   5,000 Units at 01/17/21 1334   hydrALAZINE (APRESOLINE) tablet 25 mg  25 mg Oral Q6H PRN Nicole Kindred A, DO       ipratropium-albuterol (DUONEB) 0.5-2.5 (3) MG/3ML nebulizer solution 3 mL  3 mL Nebulization Q4H PRN Amin, Ankit Chirag, MD       lisinopril (ZESTRIL) tablet 10 mg  10 mg Oral QPM Nicole Kindred A, DO   10 mg at 01/17/21 1733   MEDLINE mouth rinse  15 mL Mouth Rinse BID Nicole Kindred A, DO   15 mL at 01/17/21 0952   methocarbamol (ROBAXIN) 500 mg in dextrose 5 % 50 mL IVPB  500 mg Intravenous Q8H PRN Nicole Kindred A, DO       morphine 10 MG/5ML solution 10 mg  10 mg Oral Q2H PRN Nicole Kindred A, DO   10 mg at 01/17/21 0853   morphine 2  MG/ML injection 2 mg  2 mg Intravenous Q4H PRN Knox Royalty, NP   2 mg at 01/15/21 1750   multivitamin with minerals tablet 1 tablet  1 tablet Oral Daily Nicole Kindred A, DO   1 tablet at 01/17/21 0952   ondansetron (ZOFRAN) injection 4 mg  4 mg Intravenous Q6H PRN Zierle-Ghosh, Asia B, DO   4 mg at 01/16/21 1957   ondansetron (ZOFRAN-ODT) disintegrating tablet 4 mg  4 mg Oral Q6H PRN  Debbe Odea, MD       polyethylene glycol (MIRALAX / GLYCOLAX) packet 17 g  17 g Oral PRN Little Ishikawa, MD       senna [facility]) tablet 8.6 mg  1 tablet Oral Daily PRN Little Ishikawa, MD   8.6 mg at 01/17/21 1733   sodium chloride (OCEAN) 0.65 % nasal spray 1 spray  1 spray Each Nare PRN Nicole Kindred A, DO   1 spray at 01/10/21 2211    REVIEW OF SYSTEMS:  . A complete review of systems is obtained and is otherwise negative.    PHYSICAL EXAM:  Wt Readings from Last 3 Encounters:  01/17/21 176 lb 12.9 oz (80.2 kg)  08/24/20 193 lb (87.5 kg)  08/09/20 197 lb 3.2 oz (89.4 kg)   Temp Readings from Last 3 Encounters:  01/17/21 98 F (36.7 C) (Oral)  08/24/20 97.7 F (36.5 C)  08/09/20 97.8 F (36.6 C) (Oral)   BP Readings from Last 3 Encounters:  01/17/21 136/82  08/24/20 (!) 147/77  08/09/20 (!) 161/103   Pulse Readings from Last 3 Encounters:  01/17/21 64  08/24/20 79  08/09/20 86    /10  In general this is a well appearing man in no acute distress. He is alert and oriented x4 and appropriate throughout the examination.    KPS = 30  100 - Normal; no complaints; no evidence of disease. 90   - Able to carry on normal activity; minor signs or symptoms of disease. 80   - Normal activity with effort; some signs or symptoms of disease. 17   - Cares for self; unable to carry on normal activity or to do active work. 60   - Requires occasional assistance, but is able to care for most of his personal needs. 50   - Requires considerable assistance and frequent medical care. 53    - Disabled; requires special care and assistance. 84   - Severely disabled; hospital admission is indicated although death not imminent. 69   - Very sick; hospital admission necessary; active supportive treatment necessary. 10   - Moribund; fatal processes progressing rapidly. 0     - Dead  Karnofsky DA, Abelmann [place], Craver LS and Burchenal [facility] [phone]) The use of the nitrogen mustards in the palliative treatment of carcinoma: with particular reference to bronchogenic carcinoma Cancer 1 634-56  LABORATORY DATA:  Lab Results  Component Value Date   WBC 29.7 (H) 01/16/2021   HGB 10.0 (L) 01/16/2021   HCT 31.8 (L) 01/16/2021   MCV 86.2 01/16/2021   PLT 468 (H) 01/16/2021   Lab Results  Component Value Date   NA 135 01/17/2021   K 4.0 01/17/2021   CL 100 01/17/2021   CO2 26 01/17/2021   Lab Results  Component Value Date   ALT 17 01/13/2021   AST 17 01/13/2021   ALKPHOS 120 01/13/2021   BILITOT 1.2 01/13/2021     RADIOGRAPHY: CT Angio Chest PE W and/or Wo Contrast  Result Date: 01/08/2021 CLINICAL DATA:  Chest pain.  High probability for PE. EXAM: CT ANGIOGRAPHY CHEST WITH CONTRAST TECHNIQUE: Multidetector CT imaging of the chest was performed using the standard protocol during bolus administration of intravenous contrast. Multiplanar CT image reconstructions and MIPs were obtained to evaluate the vascular anatomy. CONTRAST:  142m OMNIPAQUE IOHEXOL 350 MG/ML SOLN COMPARISON:  Prior CT scan of the chest 04/05/2020 FINDINGS: Cardiovascular: Satisfactory opacification of the pulmonary arteries to the segmental level. No evidence of pulmonary embolism.  Normal heart size. No pericardial effusion. Fusiform aneurysmal dilation of the ascending thoracic aorta with a maximal transverse diameter of 4.5 cm. Scattered atherosclerotic calcifications throughout the aorta and the native coronary arteries. Mediastinum/Nodes: Unremarkable thyroid gland. High right paratracheal lymph node measures 1.4 cm in  short axis (image 22 series 4). Left paratracheal lymph node measures 1.5 cm in short axis (image 34 series 4). Subcarinal lymphadenopathy measures approximately 2.2 cm in short axis (image 42 series 4). Posterior left mediastinal mass measures a proximally 4.5 x 2.8 x 3.8 cm indirectly invades the head and neck of the left third rib. The mass also extends into the neural foramen. Lungs/Pleura: Approximally 9.0 x 8.0 x 8.7 cm ill-defined heterogeneous mass centered in the posteromedial aspect of the right lower lobe contains numerous punctate internal calcifications. The mass also appears to extend across the diaphragm and directly into segment 7 of the liver. There is a small and likely malignant associated right pleural effusion. 0.6 cm right middle lobe pulmonary nodule (image 73 series 6) is indeterminate. 2 mm nodule more superiorly on image 67 also noted. Peripheral subpleural reticulation, architectural distortion and honeycombing in the left lung base suggests underlying pulmonary fibrosis. Upper Abdomen: No acute abnormality. Musculoskeletal: Soft tissue lytic lesion destroying the head and neck of the left third rib with extension into the neural foramen as described above. No additional foci of osseous metastatic disease identified. Review of the MIP images confirms the above findings. IMPRESSION: 1. Approximally 9 cm ill-defined heterogeneous right lower lobe mass appears to cross the diaphragm and directly invade segment 7 of the liver. Findings are highly concerning for advanced primary bronchogenic carcinoma. 2. Lytic soft tissue metastasis in the left posterior mediastinum centered on the head and neck of the left third rib. The mass encroaches into the left T3-T4 neural foramen. 3. Presumably metastatic mediastinal lymphadenopathy. 4. Small right-sided pleural effusion is likely malignant. 5. Nonspecific right middle lobe pulmonary nodules measuring 0.6 and 0.2 cm. Small pulmonary metastases versus  incidental benign nodules. 6. 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 7. 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 Ascending thoracic aortic aneurysm. Recommend semi-annual imaging followup by CTA or MRA and referral to cardiothoracic surgery if not already obtained. This recommendation follows 2010 ACCF/AHA/AATS/ACR/ASA/SCA/SCAI/SIR/STS/SVM Guidelines for the Diagnosis and Management of Patients With Thoracic Aortic Disease. Circulation. 2010; 121: O277-A128. Aortic aneurysm NOS (ICD10-I71.9); Aortic Atherosclerosis (ICD10-I70.0). 8. Coronary artery calcifications. Electronically Signed   By: Jacqulynn Cadet M.D.   On: 01/08/2021 17:09   MR BRAIN W WO CONTRAST  Result Date: 01/12/2021 CLINICAL DATA:  Initial evaluation for metastatic disease. EXAM: MRI HEAD WITHOUT AND WITH CONTRAST TECHNIQUE: Multiplanar, multiecho pulse sequences of the brain and surrounding structures were obtained without and with intravenous contrast. CONTRAST:  69m GADAVIST GADOBUTROL 1 MMOL/ML IV SOLN COMPARISON:  None available. FINDINGS: Brain: Examination mildly degraded by motion artifact. Generalized age-related cerebral atrophy. Patchy T2/FLAIR hyperintensity within the periventricular deep white matter both cerebral hemispheres most consistent with chronic small vessel ischemic disease, mild to moderate in nature. No abnormal foci of restricted diffusion to suggest acute or subacute ischemia. Gray-white matter differentiation maintained. No encephalomalacia to suggest chronic cortical infarction. No evidence for acute or chronic intracranial hemorrhage. No mass lesion, midline shift or mass effect. No hydrocephalus or extra-axial fluid collection. Pituitary gland suprasellar region normal. Midline structures intact. No abnormal enhancement. No evidence for intracranial metastatic disease. Vascular: Major intracranial vascular flow voids are maintained. Skull  and upper cervical  spine: Craniocervical junction within normal limits. Bone marrow signal intensity normal. No visible focal marrow replacing lesion. No scalp soft tissue abnormality. Sinuses/Orbits: Patient status post bilateral ocular lens replacement. Globes and orbital soft tissues demonstrate no other acute finding. Paranasal sinuses are largely clear. Small left mastoid effusion, of doubtful significance. Inner ear structures grossly normal. Other: None. IMPRESSION: 1. No evidence for intracranial metastatic disease. 2. Age-related cerebral atrophy with mild to moderate chronic microvascular ischemic disease. Electronically Signed   By: Jeannine Boga M.D.   On: 01/12/2021 01:31   MR THORACIC SPINE W WO CONTRAST  Result Date: 01/11/2021 CLINICAL DATA:  Initial evaluation for metastatic workup. EXAM: MRI THORACIC WITHOUT AND WITH CONTRAST TECHNIQUE: Multiplanar and multiecho pulse sequences of the thoracic spine were obtained without and with intravenous contrast. CONTRAST:  51m GADAVIST GADOBUTROL 1 MMOL/ML IV SOLN COMPARISON:  Prior CTs from 01/08/2021. FINDINGS: Alignment: Physiologic with preservation of the normal thoracic kyphosis. No listhesis. Vertebrae: Large metastatic implant involving the left aspect of the T1 and T2 vertebral bodies is seen. Lesion measures approximately 5.1 x 3.2 x 3.7 cm (series 19, image 8). Involvement of the adjacent left posterior T2 and T3 ribs noted. Invasion of the adjacent left T2-3 and T3-4 neural foramina. The left T2-3 neural foramen is largely obliterated, with moderate narrowing at the left T3-4 neural foramen. Mild flattening of the left aspect of the adjacent thecal sac without significant stenosis or additional epidural invasion at this time. No associated pathologic fracture. Additional 1.5 cm osseous metastasis seen at the right anterior aspect of T7 (series 22, image 9). Large metastatic implant involving the right posterior L1 vertebral body as well as the adjacent  right posterior elements partially visualized (series 22, image 9). This lesion abuts the right aspect of the thecal sac at the level of T12-L1 (series 18, image 39). No other significant metastatic disease seen elsewhere within the thoracic spine at this time. Vertebral body height maintained. Underlying bone marrow signal intensity diffusely heterogeneous. No evidence for osteomyelitis discitis or septic arthritis. Cord: Normal signal and morphology. Other than the above described lesions at T2-3/T3-4 and L1 abutting the thecal sac, no other discernible epidural involvement. No other abnormal enhancement. Paraspinal and other soft tissues: Large necrotic mass positioned at the posterior right lung base again seen. Direct extension through the right hemidiaphragm into the adjacent liver noted. Associated irregular right pleural effusion. Right hilar and subcarinal adenopathy. Additional trace left pleural effusion. Findings better evaluated on prior CT. Probable cholelithiasis noted as well. Disc levels: Tiny right paracentral disc protrusion at T7-8 minimally flattens the right ventral thecal sac without significant stenosis. Otherwise, no other significant disc pathology seen within the thoracic spine. No spinal stenosis. Mild bilateral foraminal narrowing noted at T10-11 due to facet disease. IMPRESSION: 1. Large metastatic implant involving the left aspect of the T1 and T2 vertebral bodies with invasion of the adjacent left T2-3 and T3-4 neural foramina. 2. Additional 1.5 cm metastatic implant involving the right anterior aspect of T7. 3. Large metastatic implant involving the right posterior L1 vertebral body and adjacent posterior elements, partially visualized. 4. No other significant metastatic involvement within the thoracic spine at this time. No other epidural involvement. No pathologic fracture. 5. Large necrotic mass at the posterior right lung base with invasion of the adjacent right hemidiaphragm and  liver, with associated right pleural effusion and mediastinal adenopathy. Findings better evaluated on prior CT. Electronically Signed   By: BJeannine Boga  M.D.   On: 01/11/2021 04:15   CT ABDOMEN PELVIS W CONTRAST  Result Date: 01/08/2021 CLINICAL DATA:  Left lower quadrant pain EXAM: CT ABDOMEN AND PELVIS WITH CONTRAST TECHNIQUE: Multidetector CT imaging of the abdomen and pelvis was performed using the standard protocol following bolus administration of intravenous contrast. CONTRAST:  144m OMNIPAQUE IOHEXOL 350 MG/ML SOLN COMPARISON:  CT 01/19/2020, chest CT 01/08/2021, FINDINGS: Lower chest: Mild aneurysmal dilatation of the ascending aorta measuring up to 4.4 cm, heavily calcified. Extensive coronary vascular calcification. Normal cardiac size. Trace pericardial effusion. Incompletely visualized necrotic appearing subcarinal lymph node measuring up to 2.6 cm. Large necrotic appearing mass in the medial right base with punctate calcifications, this measures approximately 9.6 by 7.8 cm and extends across the right diaphragm, into the adjacent liver. Thick rimmed hypodense enhancing lesion in the right hepatic lobe measuring 3.8 cm. Small right-sided pleural effusion Hepatobiliary: No calcified gallstone. No biliary dilatation. Heterogenous enhancement of right hepatic lobe adjacent to mass lesion. Pancreas: Unremarkable. No pancreatic ductal dilatation or surrounding inflammatory changes. Spleen: Indeterminate hypoenhancing mass within the spleen measuring 2.4 cm, series 2, image 34. Adrenals/Urinary Tract: Adrenal glands are normal. Punctate nonobstructing stones within the bilateral kidneys. Scattered renal cysts. Interim finding of 14 mm indeterminate right renal lesion at the midpole, series 2, image 52. Interim finding of indeterminate 13 mm exophytic lesion mid pole left kidney, series 2, image 44. Slightly thick-walled urinary bladder. 4 mm stone within the right posterior bladder.  Stomach/Bowel: The stomach is nonenlarged. No dilated small bowel. No acute bowel wall thickening. Moderate to large stool in the rectosigmoid colon with moderate formed feces at the rectum. Negative appendix Vascular/Lymphatic: Advanced aortic atherosclerosis. No aneurysm. No suspicious nodes. Reproductive: Post treatment changes of the prostate gland Other: Presacral edema and soft tissue stranding.  No free air. Musculoskeletal: Lytic lesion and soft tissue mass involving the L1 vertebra right pedicle, lamina, vertebral body and transverse process. Soft tissue mass involves the paraspinal soft tissues as well. 2.5 cm hypodense lesion in the right paraspinal soft tissues posterior to right transverse process at L2, series 2, image 49. Large lytic lesion L5 vertebral body. Chronic bilateral pars defect at L5. 5.5 x 4.3 cm enhancing heterogenous soft tissue mass in the left gluteus muscles, series 2, image 93. IMPRESSION: 1. Small right-sided pleural effusion with large necrotic appearing mass in the posteromedial right lower lobe that appears to traverse the right diaphragm and extends into the adjacent liver. 2. Lytic lesion with associated soft tissue mass at L1 with additional lytic lesion at L5 vertebral body consistent with metastatic disease. 5.5 cm heterogenous enhancing soft tissue mass in the left gluteus muscles also suspect for metastatic disease. Small hypodense right paraspinal soft tissue mass posterior to transverse process of L2, concerning for metastatic disease 3. Vague hypodensity within the mid spleen is indeterminate for infarct or hypodense mass lesion. 4. Interval development of bilateral indeterminate renal lesions measuring up to 13 mm. 5. Nonobstructing kidney stones.  4 mm stone in the bladder Electronically Signed   By: KDonavan FoilM.D.   On: 01/08/2021 17:39   CT L-SPINE NO CHARGE  Result Date: 01/08/2021 CLINICAL DATA:  Back pain. EXAM: CT LUMBAR SPINE WITH CONTRAST TECHNIQUE:  Technique: Multiplanar CT images of the lumbar spine were reconstructed from contemporary CT of the Abdomen and Pelvis. CONTRAST:  No additional COMPARISON:  CT abdomen and pelvis 01/19/2020 FINDINGS: Segmentation: 5 lumbar type vertebrae. Alignment: Normal. Vertebrae: Chronic bilateral L5 pars defects. 5 cm  destructive mass involving the right-sided posterior elements and posterior vertebral body of L1 mildly encroaching upon the right L1-2 neural foramen. 3.7 cm lytic lesion in the left aspect of the L5 vertebral body with focal disruption of the posterior vertebral body cortex and likely small volume epidural tumor in the left lateral recess. Preserved vertebral body heights. Paraspinal and other soft tissues: 2.5 cm hypodense mass in the paraspinal soft tissues posterior to the right L2 transverse process. Intra-abdominal and pelvic contents reported separately. Disc levels: Mild lumbar spondylosis and facet arthrosis without high-grade spinal stenosis. Mild-to-moderate multilevel neural foraminal stenosis. IMPRESSION: 1. Destructive bone lesions at L1 and L5 consistent with metastatic disease. Suspected small volume epidural tumor in the left lateral recess at L5. 2. 2.5 cm paraspinal soft tissue mass posterior to the right L2 transverse process also concerning for metastatic disease. Electronically Signed   By: Logan Bores M.D.   On: 01/08/2021 18:16   DG Chest Port 1 View  Result Date: 01/10/2021 CLINICAL DATA:  Status post biopsy of right lower lobe lung mass. EXAM: PORTABLE CHEST 1 VIEW COMPARISON:  CT chest 01/08/2021 FINDINGS: Intentionally extra tori image of the chest demonstrates no pneumothorax. Low lung volumes. Bandlike density along the right mid lung attributable to fluid in the minor fissure. Retro diaphragmatic density on the right compatible with known right lower lobe lesion. Stable mild peripheral interstitial accentuation in the lungs. Atherosclerotic calcification of the aortic arch.  Degenerative glenohumeral arthropathy, left greater than right, with some volume loss in the left humeral head. Thoracic spondylosis noted. The known lytic lesion involving the left third rib and left third and fourth vertebra is relatively difficult to visualize on today's conventional radiograph. IMPRESSION: 1. No pneumothorax. 2. Trace fluid in the right minor fissure. 3. Mild chronic interstitial accentuation in the lung periphery. 4. Left greater than right glenohumeral arthropathy. 5.  Aortic Atherosclerosis (ICD10-I70.0). 6. Retro diaphragmatic density on the right compatible with known mass. Electronically Signed   By: Van Clines M.D.   On: 01/10/2021 14:51   CT LUNG MASS BIOPSY  Result Date: 01/10/2021 INDICATION: 75 year old male with indeterminate right lower lobe lung mass. EXAM: CT-guided lung biopsy COMPARISON:  CT chest from 01/08/2021 MEDICATIONS: None. ANESTHESIA/SEDATION: Fentanyl 50 mcg IV; Versed 1 mg IV Sedation time: 13 minutes; The patient was continuously monitored during the procedure by the interventional radiology nurse under my direct supervision. CONTRAST:  None COMPLICATIONS: None immediate. PROCEDURE: Informed consent was obtained from the patient following an explanation of the procedure, risks, benefits and alternatives. The patient understands,agrees and consents for the procedure. All questions were addressed. A time out was performed prior to the initiation of the procedure. The patient was positioned in the left lateral decubitus position on the CT table and a limited chest CT was performed for procedural planning demonstrating similar-appearing right lower lobe mass. The operative site was prepped and draped in the usual sterile fashion. Under sterile conditions and local anesthesia, a 17 gauge coaxial needle was advanced into the peripheral aspect of the nodule. Positioning was confirmed with intermittent CT fluoroscopy and followed by the acquisition of total of 3  core samples with an 18 gauge core needle biopsy device. The coaxial needle was removed following deployment of a [employer] plug and superficial hemostasis was achieved with manual compression. Limited post procedural chest CT was negative for pneumothorax or additional complication. A dressing was placed. The patient tolerated the procedure well without immediate postprocedural complication. The patient was escorted to have  an upright chest radiograph. IMPRESSION: Technically successful CT guided core needle core biopsy of right lower lung mass. Ruthann Cancer, MD [facility] Radiology Electronically Signed   By: Ruthann Cancer M.D.   On: 01/10/2021 13:47      IMPRESSION/PLAN: 1. 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 Today, I talked to the patient and family about the findings and work-up thus far.  We discussed the natural history of painful bone metastases from stage IV lung cancer and general treatment, highlighting the role of radiotherapy in the management.  We discussed the available radiation techniques, and focused on the details of logistics and delivery.  We reviewed the anticipated acute and late sequelae associated with radiation in this setting.  The patient was encouraged to ask questions that I answered to the best of my ability.  The patient would like to proceed with radiation and will be scheduled for CT simulation.  I personally spent 60 minutes in this encounter including chart review, reviewing radiological studies, meeting face-to-face with the patient, entering orders and completing documentation.    Nicholos Johns, PA-C    Tyler Pita, MD  [facility] Direct Dial: [phone]  Fax: [phone] [email or web address]  Skype  [email or web address]

## 2021-01-17 NOTE — Progress Notes (Signed)
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  Initially presented to [facility] for poor p.o. intake back pain constipation imaging revealed large necrotic mass in the right lower lobe extending into the liver and diaphragm.  Transferred to [facility] with IR biopsy on 01/10/2021 diagnosed with stage IV non-small cell cancer.  Ultimately transitioning to [facility] for further evaluation and treatment with radiation oncology and hematology oncology for ongoing palliative treatment and spinal radiation. Hospital course complicated by malignant hypercalcemia which has improved with treatment  Subjective: No acute issues or events overnight, planned radiation later this morning; denies nausea vomiting diarrhea constipation headache fevers chills; respiratory status and pain improving but not yet resolved.    Assessment & Plan:   Non-small cell carcinoma of lung, stage 4,  with acute hypoxic respiratory failure , right malignant pleural effusion, soft tissue and lytic mets, metastatic mediastinal lymphadenopathy and direct invasion of the liver - Right lung mass pathology report from 9/8 reveals> Poorly differentiated non-small cell carcinoma associated with extensive necrosis.  - Continue supplemental oxygen to keep pulse ox greater than 90% -requiring upwards of 4 L nasal cannula even at rest - Appreciate oncology and palliative care assistance -currently DNR - Per oncology, the patient is not a good candidate for treatment especially considering his underlying dysphagia and severe malnutrition - Palliative spinal radiation to multilevel spinal mets - Pain control with liquid oral morphine and IV  morphine -titrate as necessary - Also receiving Decadron & PRN Robaxin  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 - Secondary to lytic lesions in spine - Corrected calcium was 13.7 on 9/6 - Given Zometa on 9/7 - Treated with aggressive IV fluids  Opoid related Constipation-severe - Required Relistor on 9/6 and again on 9/10 -Continue to increase bowel movement regimen as indicated  Leukocytosis - Likely multifactorial in the setting of reactive leukemoid reaction with steroids vs ongoing cancer as abve - WBC count was 22.9 on 9/6 and is now 29.7   Dysphagia-history of cancer of base of tongue - Evaluated by SLP who recommended further work up with barium swallow -Currently refusing PEG tube placement -which given his prognosis will likely only prolong his symptoms   Post obstructive vs community acquired pneumonia, resolved - He has completed a course of ceftriaxone and azithromycin   Radiculopathy - Masses encroaching on T3-T4 neural foramina - Lytic lesions in the L-spine bodies also noted - has improved with Decadron  Protein-calorie malnutrition, severe with notable hypoalbuminemia -Continue dietary supplements as tolerated, continues to refuse PEG tube placement  Hypokalemia - Well-controlled, follow repeat labs  Essential hypertension - Continue lisinopril and as needed hydralazine  Anemia of chronic disease - Continue to follow intermittently  History of prostate cancer with radioactive seed implantation 3/22 -Last PSA was 2.6 on 10/31/2020  History of nephrolithiasis status post ESWL   Time spent in minutes: 40 DVT prophylaxis: heparin injection 5,000 Units Start: 01/11/21 0600 SCDs Start: 01/08/21 2224  Code Status: DNR Family Communication: Daughter previously updated Level of Care: Level of care: Med-Surg Disposition Plan:  Status is: Inpatient  Remains inpatient appropriate because:Inpatient level of care appropriate due to severity of illness  Dispo:  The patient is from: Home  Anticipated d/c is to: SNF              Patient currently is not medically stable to d/c.   Difficult to place patient No  Consultants:  Oncology IR Palliative care  Procedures:  IR biopsy  Antimicrobials:  Anti-infectives (From admission, onward)    Start     Dose/Rate Route Frequency Ordered Stop   01/09/21 2000  cefTRIAXone (ROCEPHIN) 2 g in sodium chloride 0.9 % 100 mL IVPB        2 g 200 mL/hr over 30 Minutes Intravenous Every 24 hours 01/08/21 2224 01/13/21 2018   01/09/21 2000  azithromycin (ZITHROMAX) 500 mg in sodium chloride 0.9 % 250 mL IVPB        500 mg 250 mL/hr over 60 Minutes Intravenous Every 24 hours 01/08/21 2224 01/13/21 2142   01/08/21 1930  cefTRIAXone (ROCEPHIN) 1 g in sodium chloride 0.9 % 100 mL IVPB        1 g 200 mL/hr over 30 Minutes Intravenous  Once 01/08/21 1929 01/08/21 2053   01/08/21 1930  azithromycin (ZITHROMAX) 500 mg in sodium chloride 0.9 % 250 mL IVPB        500 mg 250 mL/hr over 60 Minutes Intravenous  Once 01/08/21 1929 01/08/21 2219        Objective: Vitals:   01/16/21 1955 01/16/21 2322 01/17/21 0331 01/17/21 0455  BP: (!) 158/91 (!) 163/90 (!) 155/86   Pulse: 61 66 60   Resp: 16 14 15    Temp: 97.7 F (36.5 C) (!) 97.2 F (36.2 C) 97.9 F (36.6 C)   TempSrc: Oral Oral Oral   SpO2: 100% 100% 100%   Weight:    80.2 kg  Height:        Intake/Output Summary (Last 24 hours) at 01/17/2021 0825 Last data filed at 01/17/2021 0348 Gross per 24 hour  Intake --  Output 1025 ml  Net -1025 ml    Filed Weights   01/16/21 0500 01/16/21 1540 01/17/21 0455  Weight: 81.3 kg 80.3 kg 80.2 kg    Examination: General exam: Appears comfortable  HEENT: PERRLA, oral mucosa moist, no sclera icterus or thrush Respiratory system: Clear to auscultation. Respiratory effort normal. Cardiovascular system: S1 & S2 heard, RRR.   Gastrointestinal system: Abdomen soft, non-tender, nondistended. Normal  bowel sounds. Central nervous system: Alert and oriented. No focal neurological deficits. Extremities: No cyanosis, clubbing or edema Skin: No rashes or ulcers Psychiatry:  Mood & affect appropriate.     Data Reviewed: I have personally reviewed following labs and imaging studies  CBC: Recent Labs  Lab 01/11/21 0509 01/12/21 0114 01/13/21 0047 01/14/21 0131 01/16/21 0243  WBC 21.0* 23.5* 23.1* 22.7* 29.7*  HGB 9.2* 9.4* 9.0* 8.6* 10.0*  HCT 30.1* 30.7* 29.4* 28.0* 31.8*  MCV 88.5 87.2 88.0 88.3 86.2  PLT 423* 456* 386 349 468*    Basic Metabolic Panel: Recent Labs  Lab 01/11/21 0509 01/12/21 0114 01/13/21 0047 01/14/21 0131 01/15/21 0137 01/16/21 0243 01/17/21 0535  NA 140 141 139 138 135 134* 135  K 3.1* 3.2* 3.5 3.8 3.4* 3.9 4.0  CL 104 108 106 105 101 101 100  CO2 25 24 25 26 25 25 26   GLUCOSE 74 70 88 93 110* 146* 131*  BUN 15 12 9  7* 5* 11 15  CREATININE 0.71 0.62 0.58* 0.57* 0.57* 0.56* 0.49*  CALCIUM 10.1 9.0 8.3* 8.1* 7.9* 7.9* 7.9*  MG 1.9 1.8 1.8 1.8  --   --   --  GFR: Estimated Creatinine Clearance: 90.2 mL/min (A) (by C-G formula based on SCr of 0.49 mg/dL (L)). Liver Function Tests: Recent Labs  Lab 01/11/21 0509 01/13/21 0047  AST 13* 17  ALT 15 17  ALKPHOS 118 120  BILITOT 1.5* 1.2  PROT 4.7* 4.5*  ALBUMIN 1.6* 1.5*    No results for input(s): LIPASE, AMYLASE in the last 168 hours. No results for input(s): AMMONIA in the last 168 hours. Coagulation Profile: No results for input(s): INR, PROTIME in the last 168 hours.  Cardiac Enzymes: No results for input(s): CKTOTAL, CKMB, CKMBINDEX, TROPONINI in the last 168 hours. BNP (last 3 results) No results for input(s): PROBNP in the last 8760 hours. HbA1C: No results for input(s): HGBA1C in the last 72 hours. CBG: No results for input(s): GLUCAP in the last 168 hours. Lipid Profile: No results for input(s): CHOL, HDL, LDLCALC, TRIG, CHOLHDL, LDLDIRECT in the last 72  hours. Thyroid Function Tests: No results for input(s): TSH, T4TOTAL, FREET4, T3FREE, THYROIDAB in the last 72 hours. Anemia Panel: No results for input(s): VITAMINB12, FOLATE, FERRITIN, TIBC, IRON, RETICCTPCT in the last 72 hours. Urine analysis:    Component Value Date/Time   COLORURINE YELLOW 01/08/2021 1615   APPEARANCEUR CLEAR 01/08/2021 1615   APPEARANCEUR Clear 08/09/2020 1457   LABSPEC 1.025 01/08/2021 1615   PHURINE 6.0 01/08/2021 1615   GLUCOSEU NEGATIVE 01/08/2021 1615   HGBUR NEGATIVE 01/08/2021 [place] 01/08/2021 1615   BILIRUBINUR Negative 08/09/2020 [place] 01/08/2021 [place] 01/08/2021 1615   NITRITE NEGATIVE 01/08/2021 1615   [place] 01/08/2021 1615   Radiology Studies: No results found.  Scheduled Meds:  (feeding supplement) PROSource Plus  30 mL Oral BID BM   dexamethasone (DECADRON) injection  8 mg Intravenous Q8H   feeding supplement  1 Container Oral TID BM   guaiFENesin-dextromethorphan  5 mL Oral Q6H   heparin  5,000 Units Subcutaneous Q8H   lisinopril  10 mg Oral QPM   mouth rinse  15 mL Mouth Rinse BID   multivitamin with minerals  1 tablet Oral Daily   Continuous Infusions:  methocarbamol (ROBAXIN) IV       LOS: 8 days   Little Ishikawa, DO [facility] Pager: Secure chat  7 PM-7 AM- [email or web address] 01/17/2021, 8:25 AM

## 2021-01-17 NOTE — Progress Notes (Addendum)
Speech Language Pathology Treatment: Dysphagia  Patient Details Name: Cody Carr MRN: [id number] DOB: Apr 30, 1946 Today's Date: 01/17/2021 Time: 3762-8315 SLP Time Calculation (min) (ACUTE ONLY): 35 min  Assessment / Plan / Recommendation Clinical Impression  SLP treatment session to address patient's dysphagia goals. Daughter and pt's wife present in the room.  Obtained more premorbid information included length of time pt's voice changes- which reportedly has been approximately one month- which daughter states worsened since pt was dehydrated.    Today pt's voice remains aphonic- whispered voicing only.  She also endorses pt was given Symbicort at [facility] for his cough.  Per discussion re: swallow ability - pt reports improved clearance of thinner consistencies vs thicker - suspect due to potential iatrogenic effects of 2008 XRT impacting motility.    Gustatory and swallow changes impacting pt's nutrition at this time, per pt's daughter.  Pt's goal is to avoid PEG tube - and thus be able to maintain nutrition orally.  He had a PEG in the past during his throat cancer treatment, thus is familiar.  Palpation of neck conducted without obvious severe "stiffness".      SLP then observed pt consuming small amounts of his lunch including thin water via cup/straw/tsp and broth.  Swallowing liquids via cup/straw followed by overt coughing and intermittent scant amount of expectoration- concerning for airway infiltration.    Decreasing bolus size to TSP amounts prevented pt coughing - likely due to decreasing amount of retention - allowing pooling at pyriform sinus.  Daughter states pt coughs "when hydrating" and clears secretions.    Encouraged pt to continue to cup or straw sips of thin water to help secretion clearance and hydration.  However, advised pt consume other liquids - eg milk, Boost Breeze, etc via tsp due to concern for aspiration - especially as patient proceeds with cancer treatment  increased pneumonia risk and he admits some discomfort from coughing associated with po intake.   Pt states HOB reclined partially helps him to clear pharynx better - suspect may allow pooling posterior pharynx- posterior to airway.  SLP discussed with family/pt options for MBS = to which daughter desired to proceed.     Will plan MBS 9/16 to allow visualization of structures, assure pt is on least restrictive diet with most effective compensation strategies.   FEES is not available at [facility] - to which SLP informed pt and daughter.  Left bed in partial Trendelenburg position to maintain optimal position for po.  Spoke to RN re: care plan, recommendations.    Messaged RN re: pt's constipation due to plan for MBS - giving barium- and she reports last BM was 9/13 - advised she would provide stool softener.   Thank you!    HPI HPI: 74yo male admitted 01/08/21 with progressive back pain, constipation, and poor PO intake. PMH: nephrolithiasis, hyperlipidemia, hypertension, prostate cancer with seeds, throat cancer status post chemoradiation, stage 4 lung cancer. CT small R pleural effusion with large necrotic appearing mass in R lower lobe that extends into diaphragm and liver. 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      SLP Plan  MBS (per discussion with pt and daughter)      Recommendations for follow up therapy are one component of a multi-disciplinary discharge planning process, led by the attending physician.  Recommendations may be updated based on patient status, additional functional criteria and insurance authorization.    Recommendations  Diet recommendations: Thin liquid (liquid diet) Liquids provided  via: Teaspoon (water via cup/straw) Medication Administration: Other (Comment) (as tolerated) Compensations: Slow rate;Small sips/bites Postural Changes and/or Swallow Maneuvers: Seated upright 90 degrees;Upright 30-60 min after meal                Oral  Care Recommendations: Oral care BID Follow up Recommendations: Other (comment) SLP Visit Diagnosis: Dysphagia, unspecified (R13.10) Plan: MBS (per discussion with pt and daughter)       [place], [place] Office [phone] Pager [phone]  Cody Carr  01/17/2021, 3:25 PM

## 2021-01-18 ENCOUNTER — Encounter: Payer: Self-pay | Admitting: *Deleted

## 2021-01-18 ENCOUNTER — Ambulatory Visit
Admit: 2021-01-18 | Discharge: 2021-01-18 | Disposition: A | Discharge status: Home / Self Care | Payer: Medicare PPO | Attending: Radiation Oncology | Admitting: Radiation Oncology

## 2021-01-18 ENCOUNTER — Inpatient Hospital Stay (HOSPITAL_COMMUNITY): Payer: Medicare PPO

## 2021-01-18 ENCOUNTER — Encounter: Payer: Self-pay | Admitting: Radiation Oncology

## 2021-01-18 ENCOUNTER — Other Ambulatory Visit: Payer: Self-pay | Admitting: Oncology

## 2021-01-18 DIAGNOSIS — C3491 Malignant neoplasm of unspecified part of right bronchus or lung: Secondary | ICD-10-CM

## 2021-01-18 DIAGNOSIS — E440 Moderate protein-calorie malnutrition: Secondary | ICD-10-CM | POA: Diagnosis not present

## 2021-01-18 LAB — CBC
HCT: 33.7 % — ABNORMAL LOW (ref 39.0–52.0)
Hemoglobin: 10.5 g/dL — ABNORMAL LOW (ref 13.0–17.0)
MCH: 26.9 pg (ref 26.0–34.0)
MCHC: 31.2 g/dL (ref 30.0–36.0)
MCV: 86.2 fL (ref 80.0–100.0)
Platelets: 467 10*3/uL — ABNORMAL HIGH (ref 150–400)
RBC: 3.91 MIL/uL — ABNORMAL LOW (ref 4.22–5.81)
RDW: 16.7 % — ABNORMAL HIGH (ref 11.5–15.5)
WBC: 25.2 10*3/uL — ABNORMAL HIGH (ref 4.0–10.5)
nRBC: 0 % (ref 0.0–0.2)

## 2021-01-18 LAB — BASIC METABOLIC PANEL
Anion gap: 9 (ref 5–15)
BUN: 16 mg/dL (ref 8–23)
CO2: 26 mmol/L (ref 22–32)
Calcium: 8.1 mg/dL — ABNORMAL LOW (ref 8.9–10.3)
Chloride: 99 mmol/L (ref 98–111)
Creatinine, Ser: 0.53 mg/dL — ABNORMAL LOW (ref 0.61–1.24)
GFR, Estimated: >60 mL/min (ref >60)
Glucose, Bld: 131 mg/dL — ABNORMAL HIGH (ref 70–99)
Potassium: 3.9 mmol/L (ref 3.5–5.1)
Sodium: 134 mmol/L — ABNORMAL LOW (ref 135–145)

## 2021-01-18 MED ORDER — SENNA 8.6 MG PO TABS
1.0000 | ORAL_TABLET | Freq: Every day | ORAL | Status: DC
  Filled 2021-01-18 (×2): qty 1

## 2021-01-18 MED ORDER — ADULT MULTIVITAMIN LIQUID CH
15.0000 mL | Freq: Every day | ORAL | Status: DC
  Administered 2021-01-19 – 2021-01-20 (×2): 15 mL via ORAL
  Filled 2021-01-18 (×7): qty 15

## 2021-01-18 MED ORDER — POLYETHYLENE GLYCOL 3350 17 G PO PACK
17.0000 g | PACK | Freq: Two times a day (BID) | ORAL | Status: DC
  Administered 2021-01-20: 17 g via ORAL
  Filled 2021-01-18 (×4): qty 1

## 2021-01-18 NOTE — Progress Notes (Signed)
Per Mikey Bussing NP, I notified pathology to send recent bx case number [id number] for PDL 1 testing.

## 2021-01-18 NOTE — TOC Progression Note (Signed)
Transition of Care [facility]) - Progression Note    Patient Details  Name: Cody Carr MRN: [id number] Date of Birth: Nov 08, 1945  [facility] CM/SW Contact  Joevon Holliman, Marjie Skiff, RN Phone Number: 01/18/2021, 1:15 PM  Clinical Narrative:     TOC is following for dc needs. Pt was set up for home health prior to tx to [facility]. Per staff, pt is not walking now. Waiting to see what PT says. Per MD there is some talk around placing a Peg tube. Not medically ready for dc yet.  Expected Discharge Plan: [place] Barriers to Discharge: Continued Medical Work up  Expected Discharge Plan and Services Expected Discharge Plan: [place]   Discharge Planning Services: CM Consult Post Acute Care Choice: Durable Medical Equipment, Home Health Living arrangements for the past 2 months: Single Family Home                 DME Arranged: Hospital bed, Wheelchair manual DME Agency: [employer] Date DME Agency Contacted: 01/11/21 Time DME Agency Contacted: [id number] Representative spoke with at DME Agency: Freda Munro HH Arranged: RN, PT, OT [facility] Agency: [place] Date [place]: 01/11/21 Time [place]: 110 Representative spoke with at [place]: [place] ([place]) Interventions    Readmission Risk Interventions No flowsheet data found.

## 2021-01-18 NOTE — Progress Notes (Signed)
SLP Cancellation Note  Patient Details Name: Cody Carr MRN: [id number] DOB: August 29, 1945   Cancelled treatment:        Planned for MBS today. MD discussed with pt, PO intake currently very painful and pt is refusing PO. Plan is for PEG with SLP f/u next week to address needs as appropriate.    Miles Borkowski, Katherene Ponto 01/18/2021, 1:27 PM

## 2021-01-18 NOTE — Progress Notes (Signed)
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  Initially presented to [facility] for poor p.o. intake back pain constipation imaging revealed large necrotic mass in the right lower lobe extending into the liver and diaphragm. Transferred to [facility] with IR biopsy on 01/10/2021 diagnosed with stage IV non-small cell cancer. Ultimately transitioning to [facility] for further evaluation and treatment with radiation oncology and hematology oncology for ongoing palliative treatment and spinal radiation. Hospital course complicated by malignant hypercalcemia which has improved with treatment  Subjective: No acute issues or events overnight, planned radiation later this morning; denies nausea vomiting diarrhea constipation headache fevers chills; respiratory status and pain improving but not yet resolved.  Assessment & Plan: Non-small cell carcinoma of lung, stage 4,  with acute hypoxic respiratory failure , right malignant pleural effusion, soft tissue and lytic mets, metastatic mediastinal lymphadenopathy and direct invasion of the liver - Right lung mass pathology report from 9/8 reveals > Poorly differentiated non-small cell carcinoma associated with extensive necrosis.  - Continue supplemental oxygen to keep pulse ox greater than 90% - requiring upwards of 4 L nasal cannula even at rest - Appreciate oncology and palliative care assistance - currently DNR - Per oncology, the patient is not a good candidate for treatment especially considering his underlying dysphagia and severe malnutrition - Palliative spinal radiation to multilevel spinal mets - Pain control with liquid oral morphine and IV morphine  -titrate as necessary - Also receiving Decadron & PRN Robaxin  Dysphagia-history of cancer of base of tongue - Evaluated by SLP who recommended further work up with barium swallow - patient currently refusing - PEG tube planned once able to be scheduled with IR  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 - Secondary to lytic lesions in spine - Corrected calcium was 13.7 on 9/6 - Given Zometa on 9/7 - Treated with aggressive IV fluids  Opoid related Constipation-severe - Required Relistor on 9/6 and again on 9/10 -Continue to increase bowel movement regimen as indicated  Leukocytosis - Likely multifactorial in the setting of reactive leukemoid reaction with steroids vs ongoing cancer as abve - WBC count remains elevated but minimally downtrending   Post obstructive vs community acquired pneumonia, resolved - He has completed a course of ceftriaxone and azithromycin   Radiculopathy - Masses encroaching on T3-T4 neural foramina - Lytic lesions in the L-spine bodies also noted - has improved with Decadron  Protein-calorie malnutrition, severe with notable hypoalbuminemia -Continue dietary supplements as tolerated, continues to refuse PEG tube placement  Hypokalemia - Well-controlled, follow repeat labs  Essential hypertension - Continue lisinopril and as needed hydralazine  Anemia of chronic disease - Continue to follow intermittently  History of prostate cancer with radioactive seed implantation 3/22 -Last PSA was 2.6 on 10/31/2020  History of nephrolithiasis status post ESWL   Time spent in minutes: 40 DVT prophylaxis: heparin injection 5,000 Units Start: 01/11/21 0600 SCDs Start: 01/08/21 2224  Code Status: DNR Family Communication: Daughter previously updated Level of Care: Level of care: Med-Surg Disposition Plan:  Status is: Inpatient  Remains inpatient appropriate because:Inpatient level of care appropriate due to severity of illness  Dispo: The patient is from:  Home  Anticipated d/c is to: SNF              Patient currently is not medically stable to d/c.   Difficult to place patient No  Consultants:  Oncology IR Palliative care  Procedures:  IR biopsy  Antimicrobials:  Anti-infectives (From admission, onward)    Start     Dose/Rate Route Frequency Ordered Stop   01/09/21 2000  cefTRIAXone (ROCEPHIN) 2 g in sodium chloride 0.9 % 100 mL IVPB        2 g 200 mL/hr over 30 Minutes Intravenous Every 24 hours 01/08/21 2224 01/13/21 2018   01/09/21 2000  azithromycin (ZITHROMAX) 500 mg in sodium chloride 0.9 % 250 mL IVPB        500 mg 250 mL/hr over 60 Minutes Intravenous Every 24 hours 01/08/21 2224 01/13/21 2142   01/08/21 1930  cefTRIAXone (ROCEPHIN) 1 g in sodium chloride 0.9 % 100 mL IVPB        1 g 200 mL/hr over 30 Minutes Intravenous  Once 01/08/21 1929 01/08/21 2053   01/08/21 1930  azithromycin (ZITHROMAX) 500 mg in sodium chloride 0.9 % 250 mL IVPB        500 mg 250 mL/hr over 60 Minutes Intravenous  Once 01/08/21 1929 01/08/21 2219        Objective: Vitals:   01/17/21 1400 01/17/21 2055 01/18/21 0441 01/18/21 0711  BP: 136/82 (!) 167/91 (!) 173/87   Pulse: 64 75 77   Resp: 15 17 18    Temp: 98 F (36.7 C) 98 F (36.7 C) 97.6 F (36.4 C)   TempSrc: Oral Oral Oral   SpO2: 100% 99% 100%   Weight:    78.4 kg  Height:        Intake/Output Summary (Last 24 hours) at 01/18/2021 0808 Last data filed at 01/18/2021 7858 Gross per 24 hour  Intake 360 ml  Output 600 ml  Net -240 ml    Filed Weights   01/16/21 1540 01/17/21 0455 01/18/21 0711  Weight: 80.3 kg 80.2 kg 78.4 kg    Examination: General exam: Appears comfortable  HEENT: PERRLA, oral mucosa moist, no sclera icterus or thrush Respiratory system: Clear to auscultation. Respiratory effort normal. Cardiovascular system: S1 & S2 heard, RRR.   Gastrointestinal system: Abdomen soft, non-tender, nondistended. Normal bowel sounds. Central nervous  system: Alert and oriented. No focal neurological deficits. Extremities: No cyanosis, clubbing or edema Skin: No rashes or ulcers Psychiatry:  Mood & affect appropriate.     Data Reviewed: I have personally reviewed following labs and imaging studies  CBC: Recent Labs  Lab 01/12/21 0114 01/13/21 0047 01/14/21 0131 01/16/21 0243 01/18/21 0456  WBC 23.5* 23.1* 22.7* 29.7* 25.2*  HGB 9.4* 9.0* 8.6* 10.0* 10.5*  HCT 30.7* 29.4* 28.0* 31.8* 33.7*  MCV 87.2 88.0 88.3 86.2 86.2  PLT 456* 386 349 468* 467*    Basic Metabolic Panel: Recent Labs  Lab 01/12/21 0114 01/13/21 0047 01/14/21 0131 01/15/21 0137 01/16/21 0243 01/17/21 0535 01/18/21 0456  NA 141 139 138 135 134* 135 134*  K 3.2* 3.5 3.8 3.4* 3.9 4.0 3.9  CL 108 106 105 101 101 100 99  CO2 24 25 26 25 25 26 26   GLUCOSE 70 88 93 110* 146* 131* 131*  BUN 12 9 7* 5* 11 15 16   CREATININE 0.62 0.58* 0.57* 0.57* 0.56* 0.49* 0.53*  CALCIUM 9.0 8.3* 8.1* 7.9* 7.9* 7.9* 8.1*  MG 1.8 1.8 1.8  --   --   --   --  GFR: Estimated Creatinine Clearance: 88.5 mL/min (A) (by C-G formula based on SCr of 0.53 mg/dL (L)). Liver Function Tests: Recent Labs  Lab 01/13/21 0047  AST 17  ALT 17  ALKPHOS 120  BILITOT 1.2  PROT 4.5*  ALBUMIN 1.5*    No results for input(s): LIPASE, AMYLASE in the last 168 hours. No results for input(s): AMMONIA in the last 168 hours. Coagulation Profile: No results for input(s): INR, PROTIME in the last 168 hours.  Cardiac Enzymes: No results for input(s): CKTOTAL, CKMB, CKMBINDEX, TROPONINI in the last 168 hours. BNP (last 3 results) No results for input(s): PROBNP in the last 8760 hours. HbA1C: No results for input(s): HGBA1C in the last 72 hours. CBG: No results for input(s): GLUCAP in the last 168 hours. Lipid Profile: No results for input(s): CHOL, HDL, LDLCALC, TRIG, CHOLHDL, LDLDIRECT in the last 72 hours. Thyroid Function Tests: No results for input(s): TSH, T4TOTAL, FREET4,  T3FREE, THYROIDAB in the last 72 hours. Anemia Panel: No results for input(s): VITAMINB12, FOLATE, FERRITIN, TIBC, IRON, RETICCTPCT in the last 72 hours. Urine analysis:    Component Value Date/Time   COLORURINE YELLOW 01/08/2021 1615   APPEARANCEUR CLEAR 01/08/2021 1615   APPEARANCEUR Clear 08/09/2020 1457   LABSPEC 1.025 01/08/2021 1615   PHURINE 6.0 01/08/2021 1615   GLUCOSEU NEGATIVE 01/08/2021 1615   HGBUR NEGATIVE 01/08/2021 [place] 01/08/2021 1615   BILIRUBINUR Negative 08/09/2020 [place] 01/08/2021 [place] 01/08/2021 1615   NITRITE NEGATIVE 01/08/2021 1615   [place] 01/08/2021 1615   Radiology Studies: No results found.  Scheduled Meds:  (feeding supplement) PROSource Plus  30 mL Oral BID BM   dexamethasone (DECADRON) injection  8 mg Intravenous Q8H   feeding supplement  1 Container Oral TID BM   guaiFENesin-dextromethorphan  5 mL Oral Q6H   heparin  5,000 Units Subcutaneous Q8H   lisinopril  10 mg Oral QPM   mouth rinse  15 mL Mouth Rinse BID   multivitamin with minerals  1 tablet Oral Daily   Continuous Infusions:  methocarbamol (ROBAXIN) IV       LOS: 9 days   Little Ishikawa, DO [facility] Pager: Secure chat  7 PM-7 AM- [email or web address] 01/18/2021, 8:08 AM

## 2021-01-18 NOTE — Progress Notes (Signed)
Physical Therapy Treatment Patient Details Name: Cody Carr MRN: [id number] DOB: 02/10/1946 Today's Date: 01/18/2021   History of Present Illness Pt is a 75 y.o. male admitted 01/08/21 with progressive back pain, constipatino, poor PO intake. CT showed R pleural effusion, large necrotic-appearing mass in RLL that extends into diaphragm and liver. MRI showed metastatic masses on T1-T4, L1. 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 Plan for transfer to [facility] for evaluation from radiation oncology and potential initiation of radiation. PMH includes HTN, nephrolithiasis, prostate CA, throat CA s/p chemoradiation (per pt).    PT Comments    The patient complains of increased pain in the L>R hip. Patient sat  up with mod assist x 1 for  about 1 -2 minutes, assisted self back into bed. RN in to give IV pain meds and performed sitting upright again for  1-2 minutes. Patient's family present and encouraging patient .Marland Kitchen Patient  did not tolerate attempts to stand up today. Continue PT for mobility.  Recommendations for follow up therapy are one component of a multi-disciplinary discharge planning process, led by the attending physician.  Recommendations may be updated based on patient status, additional functional criteria and insurance authorization.  Follow Up Recommendations  Home health PT;Supervision/Assistance - 24 hour     Equipment Recommendations  Wheelchair (measurements PT);Wheelchair cushion (measurements PT);Hospital bed;Other (comment)    Recommendations for Other Services       Precautions / Restrictions Precautions Precautions: Back;Fall;Other (comment) Precaution Comments: Back precautions for comfort     Mobility  Bed Mobility   Bed Mobility: Rolling;Sidelying to Sit;Sit to Supine Rolling: Min guard Sidelying to sit: Mod assist;HOB elevated   Sit to supine: Supervision   General bed mobility comments: Multiple rolls R/L for linen change and pericare,  multimodal cues for back comfort, patient able to move legs over bed edge, Mod assist and use of bed pad to slowly raise trunk. Performed x 2. Patient returnwed self to supine both times, indicating in too much pain in left hip. RN gave IV meds in between sitting trials with not much change in patient's subjective pain. .    Transfers                    Ambulation/Gait             General Gait Details: Unable   Stairs             Wheelchair Mobility    Modified Rankin (Stroke Patients Only)       Balance Overall balance assessment: Needs assistance Sitting-balance support: Feet supported;Bilateral upper extremity supported Sitting balance-Leahy Scale: Fair         Standing balance comment: nt                            Cognition Arousal/Alertness: Awake/alert Behavior During Therapy: Flat affect;Anxious                                   General Comments: Pt with poor vocal quality and soft speech; following simple commands appropriately and able to make needs known. pt recognizes anxiety related to pain and attempts at sitting EOB      Exercises General Exercises - Lower Extremity Ankle Circles/Pumps: AROM;Both Heel Slides: AROM;10 reps;Both;Supine    General Comments        Pertinent Vitals/Pain Faces Pain Scale: Hurts  whole lot Pain Location: left hip areah and right Pain Descriptors / Indicators: Grimacing;Guarding Pain Intervention(s): RN gave pain meds during session;Monitored during session;Limited activity within patient's tolerance    Home Living                      Prior Function            PT Goals (current goals can now be found in the care plan section) Progress towards PT goals: Progressing toward goals    Frequency    Min 3X/week      PT Plan Current plan remains appropriate    Co-evaluation              AM-PAC PT "6 Clicks" Mobility   Outcome Measure  Help needed  turning from your back to your side while in a flat bed without using bedrails?: A Little Help needed moving from lying on your back to sitting on the side of a flat bed without using bedrails?: A Lot Help needed moving to and from a bed to a chair (including a wheelchair)?: A Lot Help needed standing up from a chair using your arms (e.g., wheelchair or bedside chair)?: Total Help needed to walk in hospital room?: Total Help needed climbing 3-5 steps with a railing? : Total 6 Click Score: 10    End of Session Equipment Utilized During Treatment: Oxygen Activity Tolerance: Patient limited by pain Patient left: in bed;with call bell/phone within reach;with bed alarm set;with family/visitor present Nurse Communication: Mobility status PT Visit Diagnosis: Muscle weakness (generalized) (M62.81);Difficulty in walking, not elsewhere classified (R26.2);Pain Pain - Right/Left: Left Pain - part of body: Hip     Time: 8250-0370 PT Time Calculation (min) (ACUTE ONLY): 25 min  Charges:  $Therapeutic Activity: 8-22 mins                     Tresa Endo PT Acute Rehabilitation Services Pager [phone] Office [phone]    Claretha Cooper 01/18/2021, 12:58 PM

## 2021-01-18 NOTE — Progress Notes (Addendum)
HEMATOLOGY-ONCOLOGY PROGRESS NOTE  SUBJECTIVE: Cody Carr is seen today for follow-up.  His wife and daughter are at the bedside.  He has started palliative radiation to T1-T2 and L1.  Plan is for a total of 4 fractions.  Daughter feels pain is already starting to improve.  The patient initially agreed to feeding tube placement but then decided to not pursue this.  Today, he reports that he is not taking in much p.o. and has decided to pursue feeding tube placement again.  He has no other additional complaints today.  Oncology History  Prostate cancer ([place])  12/09/2019 Initial Diagnosis   Prostate cancer ([place])   02/09/2020 Cancer Staging   Staging form: Prostate, AJCC 8th Edition - Clinical stage from 02/09/2020: Stage I (cT1c, cN0, cM0, PSA: 7.3, Grade Group: 1) - Signed by Freeman Caldron, PA-C on 05/11/2020      REVIEW OF SYSTEMS:   A comprehensive 10 point review of systems is negative except as noted in the HPI.  PHYSICAL EXAMINATION: ECOG PERFORMANCE STATUS: 3 - Symptomatic, >50% confined to bed  Vitals:   01/17/21 2055 01/18/21 0441  BP: (!) 167/91 (!) 173/87  Pulse: 75 77  Resp: 17 18  Temp: 98 F (36.7 C) 97.6 F (36.4 C)  SpO2: 99% 100%   Filed Weights   01/16/21 1540 01/17/21 0455 01/18/21 0711  Weight: 80.3 kg 80.2 kg 78.4 kg    Intake/Output from previous day: 09/15 0701 - 09/16 0700 In: 360 [P.O.:360] Out: 600 [Urine:600]  GENERAL: Chronically ill-appearing male, laying in bed, no distress SKIN: skin color, texture, turgor are normal, no rashes or significant lesions LUNGS: Diminished breath sounds in the anterior lung fields HEART: Regular rate and rhythm, no lower extremity edema ABDOMEN:abdomen soft, non-tender and normal bowel sounds NEURO: Alert and oriented x3.  LABORATORY DATA:  I have reviewed the data as listed CMP Latest Ref Rng & Units 01/18/2021 01/17/2021 01/16/2021  Glucose 70 - 99 mg/dL 131(H) 131(H) 146(H)  BUN 8 - 23 mg/dL _0 Creatinine 0.61 - 1.24 mg/dL 0.53(L) 0.49(L) 0.56(L)  Sodium 135 - 145 mmol/L 134(L) 135 134(L)  Potassium 3.5 - 5.1 mmol/L 3.9 4.0 3.9  Chloride 98 - 111 mmol/L 99 100 101  CO2 22 - 32 mmol/L _1 Calcium 8.9 - 10.3 mg/dL 8.1(L) 7.9(L) 7.9(L)  Total Protein 6.5 - 8.1 g/dL - - -  Total Bilirubin 0.3 - 1.2 mg/dL - - -  Alkaline Phos 38 - 126 U/L - - -  AST 15 - 41 U/L - - -  ALT 0 - 44 U/L - - -    Lab Results  Component Value Date   WBC 25.2 (H) 01/18/2021   HGB 10.5 (L) 01/18/2021   HCT 33.7 (L) 01/18/2021   MCV 86.2 01/18/2021   PLT 467 (H) 01/18/2021   NEUTROABS 19.0 (H) 01/09/2021    CT Angio Chest PE W and/or Wo Contrast  Result Date: 01/08/2021 CLINICAL DATA:  Chest pain.  High probability for PE. EXAM: CT ANGIOGRAPHY CHEST WITH CONTRAST TECHNIQUE: Multidetector CT imaging of the chest was performed using the standard protocol during bolus administration of intravenous contrast. Multiplanar CT image reconstructions and MIPs were obtained to evaluate the vascular anatomy. CONTRAST:  138m OMNIPAQUE IOHEXOL 350 MG/ML SOLN COMPARISON:  Prior CT scan of the chest 04/05/2020 FINDINGS: Cardiovascular: Satisfactory opacification of the pulmonary arteries to the segmental level. No evidence of pulmonary embolism. Normal heart size. No pericardial effusion. Fusiform aneurysmal  dilation of the ascending thoracic aorta with a maximal transverse diameter of 4.5 cm. Scattered atherosclerotic calcifications throughout the aorta and the native coronary arteries. Mediastinum/Nodes: Unremarkable thyroid gland. High right paratracheal lymph node measures 1.4 cm in short axis (image 22 series 4). Left paratracheal lymph node measures 1.5 cm in short axis (image 34 series 4). Subcarinal lymphadenopathy measures approximately 2.2 cm in short axis (image 42 series 4). Posterior left mediastinal mass measures a proximally 4.5 x 2.8 x 3.8 cm indirectly invades the head and neck of the left third rib.  The mass also extends into the neural foramen. Lungs/Pleura: Approximally 9.0 x 8.0 x 8.7 cm ill-defined heterogeneous mass centered in the posteromedial aspect of the right lower lobe contains numerous punctate internal calcifications. The mass also appears to extend across the diaphragm and directly into segment 7 of the liver. There is a small and likely malignant associated right pleural effusion. 0.6 cm right middle lobe pulmonary nodule (image 73 series 6) is indeterminate. 2 mm nodule more superiorly on image 67 also noted. Peripheral subpleural reticulation, architectural distortion and honeycombing in the left lung base suggests underlying pulmonary fibrosis. Upper Abdomen: No acute abnormality. Musculoskeletal: Soft tissue lytic lesion destroying the head and neck of the left third rib with extension into the neural foramen as described above. No additional foci of osseous metastatic disease identified. Review of the MIP images confirms the above findings. IMPRESSION: 1. Approximally 9 cm ill-defined heterogeneous right lower lobe mass appears to cross the diaphragm and directly invade segment 7 of the liver. Findings are highly concerning for advanced primary bronchogenic carcinoma. 2. Lytic soft tissue metastasis in the left posterior mediastinum centered on the head and neck of the left third rib. The mass encroaches into the left T3-T4 neural foramen. 3. Presumably metastatic mediastinal lymphadenopathy. 4. Small right-sided pleural effusion is likely malignant. 5. Nonspecific right middle lobe pulmonary nodules measuring 0.6 and 0.2 cm. Small pulmonary metastases versus incidental benign nodules. 6. 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 7. 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 Ascending thoracic aortic aneurysm. Recommend semi-annual imaging followup by CTA or MRA and referral to cardiothoracic surgery if not already obtained. This  recommendation follows 2010 ACCF/AHA/AATS/ACR/ASA/SCA/SCAI/SIR/STS/SVM Guidelines for the Diagnosis and Management of Patients With Thoracic Aortic Disease. Circulation. 2010; 121: G992-E268. Aortic aneurysm NOS (ICD10-I71.9); Aortic Atherosclerosis (ICD10-I70.0). 8. Coronary artery calcifications. Electronically Signed   By: Jacqulynn Cadet M.D.   On: 01/08/2021 17:09   MR BRAIN W WO CONTRAST  Result Date: 01/12/2021 CLINICAL DATA:  Initial evaluation for metastatic disease. EXAM: MRI HEAD WITHOUT AND WITH CONTRAST TECHNIQUE: Multiplanar, multiecho pulse sequences of the brain and surrounding structures were obtained without and with intravenous contrast. CONTRAST:  23m GADAVIST GADOBUTROL 1 MMOL/ML IV SOLN COMPARISON:  None available. FINDINGS: Brain: Examination mildly degraded by motion artifact. Generalized age-related cerebral atrophy. Patchy T2/FLAIR hyperintensity within the periventricular deep white matter both cerebral hemispheres most consistent with chronic small vessel ischemic disease, mild to moderate in nature. No abnormal foci of restricted diffusion to suggest acute or subacute ischemia. Gray-white matter differentiation maintained. No encephalomalacia to suggest chronic cortical infarction. No evidence for acute or chronic intracranial hemorrhage. No mass lesion, midline shift or mass effect. No hydrocephalus or extra-axial fluid collection. Pituitary gland suprasellar region normal. Midline structures intact. No abnormal enhancement. No evidence for intracranial metastatic disease. Vascular: Major intracranial vascular flow voids are maintained. Skull and upper cervical spine: Craniocervical junction within normal  limits. Bone marrow signal intensity normal. No visible focal marrow replacing lesion. No scalp soft tissue abnormality. Sinuses/Orbits: Patient status post bilateral ocular lens replacement. Globes and orbital soft tissues demonstrate no other acute finding. Paranasal sinuses  are largely clear. Small left mastoid effusion, of doubtful significance. Inner ear structures grossly normal. Other: None. IMPRESSION: 1. No evidence for intracranial metastatic disease. 2. Age-related cerebral atrophy with mild to moderate chronic microvascular ischemic disease. Electronically Signed   By: Jeannine Boga M.D.   On: 01/12/2021 01:31   MR THORACIC SPINE W WO CONTRAST  Result Date: 01/11/2021 CLINICAL DATA:  Initial evaluation for metastatic workup. EXAM: MRI THORACIC WITHOUT AND WITH CONTRAST TECHNIQUE: Multiplanar and multiecho pulse sequences of the thoracic spine were obtained without and with intravenous contrast. CONTRAST:  64m GADAVIST GADOBUTROL 1 MMOL/ML IV SOLN COMPARISON:  Prior CTs from 01/08/2021. FINDINGS: Alignment: Physiologic with preservation of the normal thoracic kyphosis. No listhesis. Vertebrae: Large metastatic implant involving the left aspect of the T1 and T2 vertebral bodies is seen. Lesion measures approximately 5.1 x 3.2 x 3.7 cm (series 19, image 8). Involvement of the adjacent left posterior T2 and T3 ribs noted. Invasion of the adjacent left T2-3 and T3-4 neural foramina. The left T2-3 neural foramen is largely obliterated, with moderate narrowing at the left T3-4 neural foramen. Mild flattening of the left aspect of the adjacent thecal sac without significant stenosis or additional epidural invasion at this time. No associated pathologic fracture. Additional 1.5 cm osseous metastasis seen at the right anterior aspect of T7 (series 22, image 9). Large metastatic implant involving the right posterior L1 vertebral body as well as the adjacent right posterior elements partially visualized (series 22, image 9). This lesion abuts the right aspect of the thecal sac at the level of T12-L1 (series 18, image 39). No other significant metastatic disease seen elsewhere within the thoracic spine at this time. Vertebral body height maintained. Underlying bone marrow signal  intensity diffusely heterogeneous. No evidence for osteomyelitis discitis or septic arthritis. Cord: Normal signal and morphology. Other than the above described lesions at T2-3/T3-4 and L1 abutting the thecal sac, no other discernible epidural involvement. No other abnormal enhancement. Paraspinal and other soft tissues: Large necrotic mass positioned at the posterior right lung base again seen. Direct extension through the right hemidiaphragm into the adjacent liver noted. Associated irregular right pleural effusion. Right hilar and subcarinal adenopathy. Additional trace left pleural effusion. Findings better evaluated on prior CT. Probable cholelithiasis noted as well. Disc levels: Tiny right paracentral disc protrusion at T7-8 minimally flattens the right ventral thecal sac without significant stenosis. Otherwise, no other significant disc pathology seen within the thoracic spine. No spinal stenosis. Mild bilateral foraminal narrowing noted at T10-11 due to facet disease. IMPRESSION: 1. Large metastatic implant involving the left aspect of the T1 and T2 vertebral bodies with invasion of the adjacent left T2-3 and T3-4 neural foramina. 2. Additional 1.5 cm metastatic implant involving the right anterior aspect of T7. 3. Large metastatic implant involving the right posterior L1 vertebral body and adjacent posterior elements, partially visualized. 4. No other significant metastatic involvement within the thoracic spine at this time. No other epidural involvement. No pathologic fracture. 5. Large necrotic mass at the posterior right lung base with invasion of the adjacent right hemidiaphragm and liver, with associated right pleural effusion and mediastinal adenopathy. Findings better evaluated on prior CT. Electronically Signed   By: BJeannine BogaM.D.   On: 01/11/2021 04:15   CT  ABDOMEN PELVIS W CONTRAST  Result Date: 01/08/2021 CLINICAL DATA:  Left lower quadrant pain EXAM: CT ABDOMEN AND PELVIS WITH  CONTRAST TECHNIQUE: Multidetector CT imaging of the abdomen and pelvis was performed using the standard protocol following bolus administration of intravenous contrast. CONTRAST:  150m OMNIPAQUE IOHEXOL 350 MG/ML SOLN COMPARISON:  CT 01/19/2020, chest CT 01/08/2021, FINDINGS: Lower chest: Mild aneurysmal dilatation of the ascending aorta measuring up to 4.4 cm, heavily calcified. Extensive coronary vascular calcification. Normal cardiac size. Trace pericardial effusion. Incompletely visualized necrotic appearing subcarinal lymph node measuring up to 2.6 cm. Large necrotic appearing mass in the medial right base with punctate calcifications, this measures approximately 9.6 by 7.8 cm and extends across the right diaphragm, into the adjacent liver. Thick rimmed hypodense enhancing lesion in the right hepatic lobe measuring 3.8 cm. Small right-sided pleural effusion Hepatobiliary: No calcified gallstone. No biliary dilatation. Heterogenous enhancement of right hepatic lobe adjacent to mass lesion. Pancreas: Unremarkable. No pancreatic ductal dilatation or surrounding inflammatory changes. Spleen: Indeterminate hypoenhancing mass within the spleen measuring 2.4 cm, series 2, image 34. Adrenals/Urinary Tract: Adrenal glands are normal. Punctate nonobstructing stones within the bilateral kidneys. Scattered renal cysts. Interim finding of 14 mm indeterminate right renal lesion at the midpole, series 2, image 52. Interim finding of indeterminate 13 mm exophytic lesion mid pole left kidney, series 2, image 44. Slightly thick-walled urinary bladder. 4 mm stone within the right posterior bladder. Stomach/Bowel: The stomach is nonenlarged. No dilated small bowel. No acute bowel wall thickening. Moderate to large stool in the rectosigmoid colon with moderate formed feces at the rectum. Negative appendix Vascular/Lymphatic: Advanced aortic atherosclerosis. No aneurysm. No suspicious nodes. Reproductive: Post treatment changes of  the prostate gland Other: Presacral edema and soft tissue stranding.  No free air. Musculoskeletal: Lytic lesion and soft tissue mass involving the L1 vertebra right pedicle, lamina, vertebral body and transverse process. Soft tissue mass involves the paraspinal soft tissues as well. 2.5 cm hypodense lesion in the right paraspinal soft tissues posterior to right transverse process at L2, series 2, image 49. Large lytic lesion L5 vertebral body. Chronic bilateral pars defect at L5. 5.5 x 4.3 cm enhancing heterogenous soft tissue mass in the left gluteus muscles, series 2, image 93. IMPRESSION: 1. Small right-sided pleural effusion with large necrotic appearing mass in the posteromedial right lower lobe that appears to traverse the right diaphragm and extends into the adjacent liver. 2. Lytic lesion with associated soft tissue mass at L1 with additional lytic lesion at L5 vertebral body consistent with metastatic disease. 5.5 cm heterogenous enhancing soft tissue mass in the left gluteus muscles also suspect for metastatic disease. Small hypodense right paraspinal soft tissue mass posterior to transverse process of L2, concerning for metastatic disease 3. Vague hypodensity within the mid spleen is indeterminate for infarct or hypodense mass lesion. 4. Interval development of bilateral indeterminate renal lesions measuring up to 13 mm. 5. Nonobstructing kidney stones.  4 mm stone in the bladder Electronically Signed   By: KDonavan FoilM.D.   On: 01/08/2021 17:39   CT L-SPINE NO CHARGE  Result Date: 01/08/2021 CLINICAL DATA:  Back pain. EXAM: CT LUMBAR SPINE WITH CONTRAST TECHNIQUE: Technique: Multiplanar CT images of the lumbar spine were reconstructed from contemporary CT of the Abdomen and Pelvis. CONTRAST:  No additional COMPARISON:  CT abdomen and pelvis 01/19/2020 FINDINGS: Segmentation: 5 lumbar type vertebrae. Alignment: Normal. Vertebrae: Chronic bilateral L5 pars defects. 5 cm destructive mass involving  the right-sided posterior elements and  posterior vertebral body of L1 mildly encroaching upon the right L1-2 neural foramen. 3.7 cm lytic lesion in the left aspect of the L5 vertebral body with focal disruption of the posterior vertebral body cortex and likely small volume epidural tumor in the left lateral recess. Preserved vertebral body heights. Paraspinal and other soft tissues: 2.5 cm hypodense mass in the paraspinal soft tissues posterior to the right L2 transverse process. Intra-abdominal and pelvic contents reported separately. Disc levels: Mild lumbar spondylosis and facet arthrosis without high-grade spinal stenosis. Mild-to-moderate multilevel neural foraminal stenosis. IMPRESSION: 1. Destructive bone lesions at L1 and L5 consistent with metastatic disease. Suspected small volume epidural tumor in the left lateral recess at L5. 2. 2.5 cm paraspinal soft tissue mass posterior to the right L2 transverse process also concerning for metastatic disease. Electronically Signed   By: Logan Bores M.D.   On: 01/08/2021 18:16   DG Chest Port 1 View  Result Date: 01/10/2021 CLINICAL DATA:  Status post biopsy of right lower lobe lung mass. EXAM: PORTABLE CHEST 1 VIEW COMPARISON:  CT chest 01/08/2021 FINDINGS: Intentionally extra tori image of the chest demonstrates no pneumothorax. Low lung volumes. Bandlike density along the right mid lung attributable to fluid in the minor fissure. Retro diaphragmatic density on the right compatible with known right lower lobe lesion. Stable mild peripheral interstitial accentuation in the lungs. Atherosclerotic calcification of the aortic arch. Degenerative glenohumeral arthropathy, left greater than right, with some volume loss in the left humeral head. Thoracic spondylosis noted. The known lytic lesion involving the left third rib and left third and fourth vertebra is relatively difficult to visualize on today's conventional radiograph. IMPRESSION: 1. No pneumothorax. 2.  Trace fluid in the right minor fissure. 3. Mild chronic interstitial accentuation in the lung periphery. 4. Left greater than right glenohumeral arthropathy. 5.  Aortic Atherosclerosis (ICD10-I70.0). 6. Retro diaphragmatic density on the right compatible with known mass. Electronically Signed   By: Van Clines M.D.   On: 01/10/2021 14:51   CT LUNG MASS BIOPSY  Result Date: 01/10/2021 INDICATION: 75 year old male with indeterminate right lower lobe lung mass. EXAM: CT-guided lung biopsy COMPARISON:  CT chest from 01/08/2021 MEDICATIONS: None. ANESTHESIA/SEDATION: Fentanyl 50 mcg IV; Versed 1 mg IV Sedation time: 13 minutes; The patient was continuously monitored during the procedure by the interventional radiology nurse under my direct supervision. CONTRAST:  None COMPLICATIONS: None immediate. PROCEDURE: Informed consent was obtained from the patient following an explanation of the procedure, risks, benefits and alternatives. The patient understands,agrees and consents for the procedure. All questions were addressed. A time out was performed prior to the initiation of the procedure. The patient was positioned in the left lateral decubitus position on the CT table and a limited chest CT was performed for procedural planning demonstrating similar-appearing right lower lobe mass. The operative site was prepped and draped in the usual sterile fashion. Under sterile conditions and local anesthesia, a 17 gauge coaxial needle was advanced into the peripheral aspect of the nodule. Positioning was confirmed with intermittent CT fluoroscopy and followed by the acquisition of total of 3 core samples with an 18 gauge core needle biopsy device. The coaxial needle was removed following deployment of a [employer] plug and superficial hemostasis was achieved with manual compression. Limited post procedural chest CT was negative for pneumothorax or additional complication. A dressing was placed. The patient tolerated the  procedure well without immediate postprocedural complication. The patient was escorted to have an upright chest radiograph. IMPRESSION: Technically successful CT  guided core needle core biopsy of right lower lung mass. Ruthann Cancer, MD [facility] Electronically Signed   By: Ruthann Cancer M.D.   On: 01/10/2021 13:47    ASSESSMENT AND PLAN: 1.  Metastatic non-small cell lung cancer, squamous cell carcinoma 2.  Acute respiratory failure secondary to postobstructive pneumonia 3.  Pain secondary to bone lesions 4.  Hypercalcemia secondary to malignancy, resolved 5.  Hypertension 6.  Hyperlipidemia 7.  Pulmonary fibrosis  -The patient's diagnosis and overall prognosis have been discussed with the patient and his family.  Discussed that no treatment would be curative.  He is currently undergoing palliative radiation to his painful bony metastasis.  He already notices some improvement in his pain.  He is hoping that he will be able to be more mobile if his pain is better controlled.  We will continue to work with therapy on mobility. -I again discussed with the patient and his family he will need improvement in his performance status before consideration of systemic therapy.  He plans to work on his nutritional status and agreeable to feeding tube placement.  Anticipate this will happen early next week.  He was also encouraged to work with physical therapy to improve his mobility.  I have sent a message to our thoracic navigator to confirm that PD-L1 was sent on his biopsy.  He will have further discussions with Dr. Deland Pretty Penn upon discharge regarding systemic treatment options and to determine his candidacy for treatment. -I have placed a referral to the [facility] to arrange for outpatient follow-up the week of 9/26.   LOS: 9 days   Mikey Bussing, DNP, AGPCNP-BC, AOCNP 01/18/21  Addendum  I have seen the patient,  examined him. I agree with the assessment and and plan and have edited the notes.  Pt has started palliative RT to spinal bone mets, for a total of 4 treatments. He has finally decided to get a PEG which will likely placed early next week. I called his daughter and answered her questions. Pt agrees to f/u with Dr. Tera Helper after discharge, we will arrange. Please give [place] a call when he is ready to be discharged. We have requested PD-L1 testing on his biopsy.   Truitt Merle  01/18/2021

## 2021-01-18 NOTE — Consult Note (Signed)
  Was able to see patient today at request of Dr. Avon Gully for possible image guided insertion of NG tube.  Cody Carr is an aspiration risk and his anatomy suggests need for contrast prior to procedure.  Because of this he will likely require NG tube the day prior to procedure.  He is uncertain about the NG tube as he reports negative experience with this in the past.  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  He is not saying "no" to having the procedure done but is asking time to discuss with his wife.   We will check in on Monday with patient and re-evaluate his candidacy.   Electronically Signed: Pasty Spillers 01/18/2021, 4:56 PM

## 2021-01-18 NOTE — Progress Notes (Signed)
Occupational Therapy Progress Note  Patient needing encouragement to attempt OOB mobility. Had RN provide IV pain medication during session to help with L hip pain and despite sitting up to edge of bed twice patient ultimately lays himself back down onto bed due to pain. Did participate in bed level exercises for UE and encourage patient to continue with exercises, rolling in bed for repositioning and sitting HOB upright more throughout the day. Notified RN unable to attempt using stedy for standing this session 2* patient's pain.    01/18/21 1300  OT Visit Information  Last OT Received On 01/18/21  Assistance Needed +2  PT/OT/SLP Co-Evaluation/Treatment Yes  Reason for Co-Treatment Complexity of the patient's impairments (multi-system involvement);To address functional/ADL transfers;For patient/therapist safety  PT goals addressed during session Mobility/safety with mobility  OT goals addressed during session ADL's and self-care  History of Present Illness Pt is a 75 y.o. male admitted 01/08/21 with progressive back pain, constipatino, poor PO intake. CT showed R pleural effusion, large necrotic-appearing mass in RLL that extends into diaphragm and liver. MRI showed metastatic masses on T1-T4, L1. 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 Plan for transfer to [facility] for evaluation from radiation oncology and potential initiation of radiation. PMH includes HTN, nephrolithiasis, prostate CA, throat CA s/p chemoradiation (per pt).  Precautions  Precautions Back;Fall  Precaution Comments Back precautions for comfort  Pain Assessment  Pain Assessment Faces  Faces Pain Scale 8  Pain Location left hip area  Pain Descriptors / Indicators Grimacing;Guarding  Pain Intervention(s) RN gave pain meds during session  Cognition  Arousal/Alertness Awake/alert  Behavior During Therapy Anxious  Overall Cognitive Status Within Functional Limits for tasks assessed  General Comments Pt with poor  vocal quality and soft speech; following simple commands appropriately and able to make needs known. pt recognizes anxiety related to pain and attempts at sitting EOB  Upper Extremity Assessment  Upper Extremity Assessment Generalized weakness  Lower Extremity Assessment  Lower Extremity Assessment Defer to PT evaluation  Bed Mobility  Overal bed mobility Needs Assistance  Bed Mobility Rolling;Sidelying to Sit;Sit to Supine  Rolling Min guard  Sidelying to sit Mod assist;HOB elevated  Sit to supine Supervision  General bed mobility comments Multiple rolls R/L for linen change and pericare, multimodal cues for back comfort, patient able to move legs over bed edge, Mod assist and use of bed pad to slowly raise trunk. Performed x 2. Patient returned self to supine both times, indicating in too much pain in left hip. RN gave IV meds in between sitting trials with not much change in patient's subjective pain.  Balance  Overall balance assessment Needs assistance  Sitting-balance support Feet supported;Bilateral upper extremity supported  Sitting balance-Leahy Scale Poor  Transfers  General transfer comment declined 2* pain despite IV pain medication given during session. wanted to try stedy however patient laying himself back into the bed twice  General Comments  General comments (skin integrity, edema, etc.) 100% O2 on 3L, HR 75-80  Exercises  Exercises General Upper Extremity  General Exercises - Upper Extremity  Shoulder ABduction Both;20 reps;Supine  Shoulder Flexion Both;20 reps;Supine  Elbow Flexion Both;20 reps;Supine  OT - End of Session  Equipment Utilized During Treatment Oxygen  Activity Tolerance Patient limited by pain  Patient left in bed;with call bell/phone within reach;with bed alarm set;with family/visitor present  Nurse Communication Mobility status;Other (comment) (limited 2* pain)  OT Assessment/Plan  OT Plan Discharge plan needs to be updated  OT Visit Diagnosis  Muscle weakness (generalized) (M62.81);Pain  Pain - Right/Left Left  Pain - part of body Hip  OT Frequency (ACUTE ONLY) Min 2X/week  Follow Up Recommendations Home health OT;Supervision/Assistance - 24 hour;Other (comment) (vs SNF pending patient progress, has not been getting OOB 2* pain and limited activity tolerance)  OT Equipment Wheelchair (measurements OT);Wheelchair cushion (measurements OT);Hospital bed;Other (comment)  AM-PAC OT "6 Clicks" Daily Activity Outcome Measure (Version 2)  Help from another person eating meals? 2  Help from another person taking care of personal grooming? 2  Help from another person toileting, which includes using toliet, bedpan, or urinal? 1  Help from another person bathing (including washing, rinsing, drying)? 2  Help from another person to put on and taking off regular upper body clothing? 2  Help from another person to put on and taking off regular lower body clothing? 1  6 Click Score 10  Progressive Mobility  What is the highest level of mobility based on the progressive mobility assessment? Level 2 (Chairfast) - Balance while sitting on edge of bed and cannot stand  Mobility Sit up in bed/chair position for meals  OT Goal Progression  Progress towards OT goals Not progressing toward goals - comment (pain limiting participation)  Acute Rehab OT Goals  Patient Stated Goal get appropriate DME for pt, maximize home setup for pt comfort & care.  Daughter does want pt to be able to tolerate going to doc appts with community medical transport which includes pt being able to tolerate transferring to and sitting in a wheelchair.  OT Goal Formulation With patient/family  Time For Goal Achievement 01/25/21  Potential to Achieve Goals Fair  ADL Goals  Pt Will Perform Eating with set-up;bed level;sitting  Pt Will Perform Grooming with set-up;sitting;bed level  Additional ADL Goal #1 Pt/family to demonstrate understanding of skin integrity maintenance  strategies with min verbal cues  Additional ADL Goal #2 Pt/family to demonstrate safe assist in completing ADLs at bed level  OT Time Calculation  OT Start Time (ACUTE ONLY) 1150  OT Stop Time (ACUTE ONLY) 1215  OT Time Calculation (min) 25 min  OT General Charges  $OT Visit 1 Visit  OT Treatments  $Therapeutic Exercise 8-22 mins   Cody Carr OT OT pager: [phone]

## 2021-01-19 DIAGNOSIS — E440 Moderate protein-calorie malnutrition: Secondary | ICD-10-CM | POA: Diagnosis not present

## 2021-01-19 DIAGNOSIS — C3491 Malignant neoplasm of unspecified part of right bronchus or lung: Secondary | ICD-10-CM | POA: Diagnosis not present

## 2021-01-19 NOTE — Plan of Care (Signed)

## 2021-01-19 NOTE — Progress Notes (Signed)
Patient was able to rest and sleep on and off during the night shift. Able to use the urinal and able to communicate needs despite of having a hoarse voice. Complaint of pain in the middle of the night, Morphine given IV. Patient also too oxygen off at one point, oxygen saturation went down to 90% on RA. Oxygen went up to 100% on 3L/[place]. Patient resting in bed with eyes closed. NAD, no SOB, call light in reach, safety maintained.

## 2021-01-19 NOTE — Progress Notes (Signed)
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  Initially presented to [facility] for poor p.o. intake back pain constipation imaging revealed large necrotic mass in the right lower lobe extending into the liver and diaphragm. Transferred to [facility] with IR biopsy on 01/10/2021 diagnosed with stage IV non-small cell cancer. Ultimately transitioning to [facility] for further evaluation and treatment with radiation oncology and hematology oncology for ongoing palliative treatment and spinal radiation. Hospital course complicated by malignant hypercalcemia which has improved with treatment  Subjective: No acute issues or events overnight, appears to be some confusion yesterday while discussing need for temporary NG tube placement for contrast for permanent PEG tube placement.  Patient is agreeable for temporary NG tube placement if necessary for definitive placement of PEG tube.  Otherwise denies chest pain shortness of breath palpitations headache fevers or chills overnight.  Assessment & Plan:  Non-small cell carcinoma of lung, stage 4,  with acute hypoxic respiratory failure , right malignant pleural effusion, soft tissue and lytic mets, metastatic mediastinal lymphadenopathy and direct invasion of the liver - Right lung mass pathology report from 9/8 reveals > Poorly differentiated non-small cell carcinoma associated with extensive necrosis.  - Continue supplemental oxygen to keep pulse ox greater than 90% - requiring upwards of 4 L nasal cannula even at rest - Appreciate oncology and palliative care assistance - currently DNR - Per oncology, the patient is not a good candidate for treatment  especially considering his underlying dysphagia and severe malnutrition - Palliative spinal radiation to multilevel spinal mets - Pain control with liquid oral morphine and IV morphine -titrate as necessary - Also receiving Decadron & PRN Robaxin  Dysphagia-history of cancer of base of tongue - Evaluated by SLP who recommended further work up with barium swallow - patient currently refusing - PEG tube planned once able to be scheduled with IR -patient requesting opportunity to take p.o. contrast in hopes to avoid NG tube placement if possible, we discussed that if he was able to appropriately drink enough contrast per radiology we may be able to avoid NG tube placement even if temporary.  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 - Secondary to lytic lesions in spine - Corrected calcium was 13.7 on 9/6 - Given Zometa on 9/7 - Treated with aggressive IV fluids  Opoid related Constipation-severe - Required Relistor on 9/6 and again on 9/10 -Continue to increase bowel movement regimen as indicated  Leukocytosis - Likely multifactorial in the setting of reactive leukemoid reaction with steroids vs ongoing cancer as abve - WBC count remains elevated but minimally downtrending   Post obstructive vs community acquired pneumonia, resolved - He has completed a course of ceftriaxone and azithromycin   Radiculopathy - Masses encroaching on T3-T4 neural foramina - Lytic lesions in the L-spine bodies also noted - has improved with Decadron  Protein-calorie malnutrition, severe with notable hypoalbuminemia -Continue dietary supplements as tolerated, continues to refuse PEG tube placement  Hypokalemia - Well-controlled, follow repeat labs  Essential hypertension - Continue lisinopril and as needed hydralazine  Anemia of chronic disease - Continue to follow intermittently  History of prostate cancer with radioactive seed implantation 3/22 -Last PSA was 2.6 on 10/31/2020  History of  nephrolithiasis status post ESWL  Time spent in minutes: 40 DVT prophylaxis: heparin injection 5,000 Units Start: 01/11/21 0600 SCDs Start: 01/08/21 2224  Code Status: DNR Family Communication: Daughter previously updated Level of Care: Level of care: Telemetry Disposition Plan:  Status is: Inpatient  Remains inpatient appropriate because:Inpatient level of care appropriate due to severity of illness  Dispo: The patient is from: Home              Anticipated d/c is to: SNF              Patient currently is not medically stable to d/c.   Difficult to place patient No  Consultants:  Oncology IR Palliative care  Procedures:  IR biopsy  Antimicrobials:  Anti-infectives (From admission, onward)    Start     Dose/Rate Route Frequency Ordered Stop   01/09/21 2000  cefTRIAXone (ROCEPHIN) 2 g in sodium chloride 0.9 % 100 mL IVPB        2 g 200 mL/hr over 30 Minutes Intravenous Every 24 hours 01/08/21 2224 01/13/21 2018   01/09/21 2000  azithromycin (ZITHROMAX) 500 mg in sodium chloride 0.9 % 250 mL IVPB        500 mg 250 mL/hr over 60 Minutes Intravenous Every 24 hours 01/08/21 2224 01/13/21 2142   01/08/21 1930  cefTRIAXone (ROCEPHIN) 1 g in sodium chloride 0.9 % 100 mL IVPB        1 g 200 mL/hr over 30 Minutes Intravenous  Once 01/08/21 1929 01/08/21 2053   01/08/21 1930  azithromycin (ZITHROMAX) 500 mg in sodium chloride 0.9 % 250 mL IVPB        500 mg 250 mL/hr over 60 Minutes Intravenous  Once 01/08/21 1929 01/08/21 2219        Objective: Vitals:   01/18/21 0711 01/18/21 1318 01/18/21 2040 01/19/21 0441  BP:  (!) 171/90 (!) 155/95 (!) 161/95  Pulse:  71 79 73  Resp:  19    Temp:  98 F (36.7 C) 97.8 F (36.6 C) (!) 97.5 F (36.4 C)  TempSrc:   Oral Oral  SpO2:  100% 99% 99%  Weight: 78.4 kg     Height:        Intake/Output Summary (Last 24 hours) at 01/19/2021 0806 Last data filed at 01/18/2021 2230 Gross per 24 hour  Intake 375 ml  Output 280 ml  Net  95 ml    Filed Weights   01/16/21 1540 01/17/21 0455 01/18/21 0711  Weight: 80.3 kg 80.2 kg 78.4 kg    Examination: General exam: Appears comfortable  HEENT: PERRLA, oral mucosa moist, no sclera icterus or thrush Respiratory system: Clear to auscultation. Respiratory effort normal. Cardiovascular system: S1 & S2 heard, RRR.   Gastrointestinal system: Abdomen soft, non-tender, nondistended. Normal bowel sounds. Central nervous system: Alert and oriented. No focal neurological deficits. Extremities: No cyanosis, clubbing or edema Skin: No rashes or ulcers Psychiatry:  Mood & affect appropriate.     Data Reviewed: I have personally reviewed following labs and imaging studies  CBC: Recent Labs  Lab 01/13/21 0047 01/14/21 0131 01/16/21 0243 01/18/21 0456  WBC 23.1* 22.7* 29.7* 25.2*  HGB 9.0* 8.6* 10.0* 10.5*  HCT 29.4* 28.0* 31.8* 33.7*  MCV 88.0 88.3 86.2 86.2  PLT 386 349 468* 467*    Basic Metabolic Panel: Recent Labs  Lab 01/13/21 0047 01/14/21 0131 01/15/21 0137 01/16/21 0243 01/17/21 0535 01/18/21 0456  NA 139 138 135 134* 135 134*  K 3.5 3.8 3.4* 3.9 4.0 3.9  CL  106 105 101 101 100 99  CO2 25 26 25 25 26 26   GLUCOSE 88 93 110* 146* 131* 131*  BUN 9 7* 5* 11 15 16   CREATININE 0.58* 0.57* 0.57* 0.56* 0.49* 0.53*  CALCIUM 8.3* 8.1* 7.9* 7.9* 7.9* 8.1*  MG 1.8 1.8  --   --   --   --     GFR: Estimated Creatinine Clearance: 88.5 mL/min (A) (by C-G formula based on SCr of 0.53 mg/dL (L)). Liver Function Tests: Recent Labs  Lab 01/13/21 0047  AST 17  ALT 17  ALKPHOS 120  BILITOT 1.2  PROT 4.5*  ALBUMIN 1.5*    No results for input(s): LIPASE, AMYLASE in the last 168 hours. No results for input(s): AMMONIA in the last 168 hours. Coagulation Profile: No results for input(s): INR, PROTIME in the last 168 hours.  Cardiac Enzymes: No results for input(s): CKTOTAL, CKMB, CKMBINDEX, TROPONINI in the last 168 hours. BNP (last 3 results) No results  for input(s): PROBNP in the last 8760 hours. HbA1C: No results for input(s): HGBA1C in the last 72 hours. CBG: No results for input(s): GLUCAP in the last 168 hours. Lipid Profile: No results for input(s): CHOL, HDL, LDLCALC, TRIG, CHOLHDL, LDLDIRECT in the last 72 hours. Thyroid Function Tests: No results for input(s): TSH, T4TOTAL, FREET4, T3FREE, THYROIDAB in the last 72 hours. Anemia Panel: No results for input(s): VITAMINB12, FOLATE, FERRITIN, TIBC, IRON, RETICCTPCT in the last 72 hours. Urine analysis:    Component Value Date/Time   COLORURINE YELLOW 01/08/2021 1615   APPEARANCEUR CLEAR 01/08/2021 1615   APPEARANCEUR Clear 08/09/2020 1457   LABSPEC 1.025 01/08/2021 1615   PHURINE 6.0 01/08/2021 1615   GLUCOSEU NEGATIVE 01/08/2021 1615   HGBUR NEGATIVE 01/08/2021 [place] 01/08/2021 1615   BILIRUBINUR Negative 08/09/2020 [place] 01/08/2021 [place] 01/08/2021 1615   NITRITE NEGATIVE 01/08/2021 1615   [place] 01/08/2021 1615   Radiology Studies: No results found.  Scheduled Meds:  (feeding supplement) PROSource Plus  30 mL Oral BID BM   dexamethasone (DECADRON) injection  8 mg Intravenous Q8H   feeding supplement  1 Container Oral TID BM   guaiFENesin-dextromethorphan  5 mL Oral Q6H   heparin  5,000 Units Subcutaneous Q8H   lisinopril  10 mg Oral QPM   mouth rinse  15 mL Mouth Rinse BID   multivitamin  15 mL Oral Daily   polyethylene glycol  17 g Oral BID   senna  1 tablet Oral Daily   Continuous Infusions:  methocarbamol (ROBAXIN) IV       LOS: 10 days   Little Ishikawa, DO [facility] Pager: Secure chat  7 PM-7 AM- [email or web address] 01/19/2021, 8:06 AM

## 2021-01-20 DIAGNOSIS — C3491 Malignant neoplasm of unspecified part of right bronchus or lung: Secondary | ICD-10-CM | POA: Diagnosis not present

## 2021-01-20 DIAGNOSIS — E440 Moderate protein-calorie malnutrition: Secondary | ICD-10-CM | POA: Diagnosis not present

## 2021-01-20 LAB — CBC
HCT: 34.1 % — ABNORMAL LOW (ref 39.0–52.0)
Hemoglobin: 10.8 g/dL — ABNORMAL LOW (ref 13.0–17.0)
MCH: 27.4 pg (ref 26.0–34.0)
MCHC: 31.7 g/dL (ref 30.0–36.0)
MCV: 86.5 fL (ref 80.0–100.0)
Platelets: 412 10*3/uL — ABNORMAL HIGH (ref 150–400)
RBC: 3.94 MIL/uL — ABNORMAL LOW (ref 4.22–5.81)
RDW: 16.8 % — ABNORMAL HIGH (ref 11.5–15.5)
WBC: 24.5 10*3/uL — ABNORMAL HIGH (ref 4.0–10.5)
nRBC: 0 % (ref 0.0–0.2)

## 2021-01-20 LAB — BASIC METABOLIC PANEL
Anion gap: 7 (ref 5–15)
BUN: 15 mg/dL (ref 8–23)
CO2: 29 mmol/L (ref 22–32)
Calcium: 8.1 mg/dL — ABNORMAL LOW (ref 8.9–10.3)
Chloride: 99 mmol/L (ref 98–111)
Creatinine, Ser: 0.4 mg/dL — ABNORMAL LOW (ref 0.61–1.24)
GFR, Estimated: >60 mL/min (ref >60)
Glucose, Bld: 130 mg/dL — ABNORMAL HIGH (ref 70–99)
Potassium: 5 mmol/L (ref 3.5–5.1)
Sodium: 135 mmol/L (ref 135–145)

## 2021-01-20 MED ORDER — HYDROXYZINE HCL 50 MG/ML IM SOLN
25.0000 mg | Freq: Once | INTRAMUSCULAR | Status: AC
  Administered 2021-01-20: 25 mg via INTRAMUSCULAR
  Filled 2021-01-20: qty 0.5

## 2021-01-20 MED ORDER — GABAPENTIN 250 MG/5ML PO SOLN
300.0000 mg | Freq: Three times a day (TID) | ORAL | Status: DC
  Administered 2021-01-20: 300 mg via ORAL
  Filled 2021-01-20 (×13): qty 6

## 2021-01-20 MED ORDER — ACETAMINOPHEN 160 MG/5ML PO SOLN: 500.0000 mg | Freq: Four times a day (QID) | ORAL | Status: DC | PRN

## 2021-01-20 MED ORDER — LORAZEPAM 2 MG/ML IJ SOLN
1.0000 mg | Freq: Once | INTRAMUSCULAR | Status: AC | PRN
  Administered 2021-01-20: 1 mg via INTRAVENOUS
  Filled 2021-01-20: qty 1

## 2021-01-20 NOTE — Progress Notes (Addendum)
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  Initially presented to [facility] for poor p.o. intake back pain constipation imaging revealed large necrotic mass in the right lower lobe extending into the liver and diaphragm. Transferred to [facility] with IR biopsy on 01/10/2021 diagnosed with stage IV non-small cell cancer. Ultimately transitioning to [facility] for further evaluation and treatment with radiation oncology and hematology oncology for ongoing palliative treatment and spinal radiation. Hospital course complicated by malignant hypercalcemia which has improved with treatment  Subjective: No acute issues or events overnight, worsening aspiration and poor p.o. intake over the past 24 hours per discussion with daughter at bedside.  Patient is unable to sit up properly to take p.o. thus increasing his risk of aspiration while trying to eat and drink essentially supine.  Have discussed potentially keeping patient n.p.o. or limited p.o. until PEG tube placement tomorrow.    Assessment & Plan:  Non-small cell carcinoma of lung, stage 4,  with acute hypoxic respiratory failure , right malignant pleural effusion, soft tissue and lytic mets, metastatic mediastinal lymphadenopathy and direct invasion of the liver - Right lung mass pathology report from 9/8 reveals > Poorly differentiated non-small cell carcinoma associated with extensive necrosis.  - Continue supplemental oxygen to keep pulse ox greater than 90% - requiring upwards of 4 L nasal cannula even at rest - Appreciate oncology and palliative care assistance - currently DNR - Per oncology, the patient is not a good candidate for treatment  especially considering his underlying dysphagia and severe malnutrition - Palliative spinal radiation to multilevel spinal mets - Pain control with liquid oral morphine and IV morphine -titrate as necessary - Adding gabapentin p.o. liquid in the setting of neuropathic pain - Also receiving Decadron & PRN Robaxin  Dysphagia-history of cancer of base of tongue - Evaluated by SLP who recommended further work up with barium swallow - patient currently refusing - PEG tube planned once able to be scheduled with IR -patient requesting opportunity to take p.o. contrast in hopes to avoid NG tube placement if possible, but given his worsening aspiration over the past 24 hours this is unlikely a possibility unless his p.o. intake improves drastically in the next 24 hours.  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 - Secondary to lytic lesions in spine - Corrected calcium was 13.7 on 9/6 - Given Zometa on 9/7 - Treated with aggressive IV fluids  Opoid related Constipation-severe -Improved with scheduled MiraLAX and Senokot, previously required Relistor  Leukocytosis - Likely multifactorial in the setting of reactive leukemoid reaction with steroids vs ongoing cancer as abve - WBC count remains elevated but minimally downtrending   Post obstructive vs community acquired pneumonia, resolved - He has completed a course of ceftriaxone and azithromycin   Radiculopathy - Masses encroaching on T3-T4 neural foramina - Lytic lesions in the L-spine bodies also noted - has improved with Decadron  Protein-calorie malnutrition, severe with notable hypoalbuminemia -Continue dietary supplements as tolerated, previously refusing PEG tube placement  Hypokalemia - Well-controlled, follow repeat labs  Essential hypertension - Continue lisinopril and as needed hydralazine  Anemia of chronic disease - Continue to follow intermittently  History of prostate cancer with radioactive seed implantation 3/22 -Last PSA  was 2.6  on 10/31/2020  History of nephrolithiasis status post ESWL   Time spent in minutes: 40 DVT prophylaxis: heparin injection 5,000 Units Start: 01/11/21 0600 SCDs Start: 01/08/21 2224  Code Status: DNR Family Communication: Daughter and wife updated at bedside daily Level of Care: Level of care: Telemetry Disposition Plan:  Status is: Inpatient  Remains inpatient appropriate because:Inpatient level of care appropriate due to severity of illness  Dispo: The patient is from: Home              Anticipated d/c is to: SNF              Patient currently is not medically stable to d/c.   Difficult to place patient No  Consultants:  Oncology IR Palliative care  Procedures:  IR biopsy  Antimicrobials:  Anti-infectives (From admission, onward)    Start     Dose/Rate Route Frequency Ordered Stop   01/09/21 2000  cefTRIAXone (ROCEPHIN) 2 g in sodium chloride 0.9 % 100 mL IVPB        2 g 200 mL/hr over 30 Minutes Intravenous Every 24 hours 01/08/21 2224 01/13/21 2018   01/09/21 2000  azithromycin (ZITHROMAX) 500 mg in sodium chloride 0.9 % 250 mL IVPB        500 mg 250 mL/hr over 60 Minutes Intravenous Every 24 hours 01/08/21 2224 01/13/21 2142   01/08/21 1930  cefTRIAXone (ROCEPHIN) 1 g in sodium chloride 0.9 % 100 mL IVPB        1 g 200 mL/hr over 30 Minutes Intravenous  Once 01/08/21 1929 01/08/21 2053   01/08/21 1930  azithromycin (ZITHROMAX) 500 mg in sodium chloride 0.9 % 250 mL IVPB        500 mg 250 mL/hr over 60 Minutes Intravenous  Once 01/08/21 1929 01/08/21 2219        Objective: Vitals:   01/19/21 0441 01/19/21 1402 01/19/21 2144 01/20/21 0554  BP: (!) 161/95 122/72 (!) 169/103 (!) 151/89  Pulse: 73 62 82 62  Resp:  17 16 15   Temp: (!) 97.5 F (36.4 C) 97.9 F (36.6 C) 97.9 F (36.6 C) (!) 97.4 F (36.3 C)  TempSrc: Oral Oral Oral Oral  SpO2: 99% 100% 96% 100%  Weight:    76.5 kg  Height:        Intake/Output Summary (Last 24 hours) at 01/20/2021  0757 Last data filed at 01/19/2021 2200 Gross per 24 hour  Intake --  Output 725 ml  Net -725 ml    Filed Weights   01/17/21 0455 01/18/21 0711 01/20/21 0554  Weight: 80.2 kg 78.4 kg 76.5 kg    Examination: General exam: Appears comfortable  HEENT: PERRLA, oral mucosa moist, no sclera icterus or thrush Respiratory system: Clear to auscultation. Respiratory effort normal. Cardiovascular system: S1 & S2 heard, RRR.   Gastrointestinal system: Abdomen soft, non-tender, nondistended. Normal bowel sounds. Central nervous system: Alert and oriented. No focal neurological deficits. Extremities: No cyanosis, clubbing or edema Skin: No rashes or ulcers Psychiatry:  Mood & affect appropriate.     Data Reviewed: I have personally reviewed following labs and imaging studies  CBC: Recent Labs  Lab 01/14/21 0131 01/16/21 0243 01/18/21 0456 01/20/21 0608  WBC 22.7* 29.7* 25.2* 24.5*  HGB 8.6* 10.0* 10.5* 10.8*  HCT 28.0* 31.8* 33.7* 34.1*  MCV 88.3 86.2 86.2 86.5  PLT 349 468* 467* 412*    Basic Metabolic Panel: Recent Labs  Lab 01/14/21 0131 01/15/21 0137 01/16/21 0243 01/17/21 0535 01/18/21 0456 01/20/21 5035  NA 138 135 134* 135 134* 135  K 3.8 3.4* 3.9 4.0 3.9 5.0  CL 105 101 101 100 99 99  CO2 26 25 25 26 26 29   GLUCOSE 93 110* 146* 131* 131* 130*  BUN 7* 5* 11 15 16 15   CREATININE 0.57* 0.57* 0.56* 0.49* 0.53* 0.40*  CALCIUM 8.1* 7.9* 7.9* 7.9* 8.1* 8.1*  MG 1.8  --   --   --   --   --     GFR: Estimated Creatinine Clearance: 86.3 mL/min (A) (by C-G formula based on SCr of 0.4 mg/dL (L)). Liver Function Tests: No results for input(s): AST, ALT, ALKPHOS, BILITOT, PROT, ALBUMIN in the last 168 hours.  No results for input(s): LIPASE, AMYLASE in the last 168 hours. No results for input(s): AMMONIA in the last 168 hours. Coagulation Profile: No results for input(s): INR, PROTIME in the last 168 hours.  Cardiac Enzymes: No results for input(s): CKTOTAL, CKMB,  CKMBINDEX, TROPONINI in the last 168 hours. BNP (last 3 results) No results for input(s): PROBNP in the last 8760 hours. HbA1C: No results for input(s): HGBA1C in the last 72 hours. CBG: No results for input(s): GLUCAP in the last 168 hours. Lipid Profile: No results for input(s): CHOL, HDL, LDLCALC, TRIG, CHOLHDL, LDLDIRECT in the last 72 hours. Thyroid Function Tests: No results for input(s): TSH, T4TOTAL, FREET4, T3FREE, THYROIDAB in the last 72 hours. Anemia Panel: No results for input(s): VITAMINB12, FOLATE, FERRITIN, TIBC, IRON, RETICCTPCT in the last 72 hours. Urine analysis:    Component Value Date/Time   COLORURINE YELLOW 01/08/2021 1615   APPEARANCEUR CLEAR 01/08/2021 1615   APPEARANCEUR Clear 08/09/2020 1457   LABSPEC 1.025 01/08/2021 1615   PHURINE 6.0 01/08/2021 1615   GLUCOSEU NEGATIVE 01/08/2021 1615   HGBUR NEGATIVE 01/08/2021 [place] 01/08/2021 1615   BILIRUBINUR Negative 08/09/2020 [place] 01/08/2021 [place] 01/08/2021 1615   NITRITE NEGATIVE 01/08/2021 1615   [place] 01/08/2021 1615   Radiology Studies: No results found.  Scheduled Meds:  (feeding supplement) PROSource Plus  30 mL Oral BID BM   dexamethasone (DECADRON) injection  8 mg Intravenous Q8H   feeding supplement  1 Container Oral TID BM   guaiFENesin-dextromethorphan  5 mL Oral Q6H   heparin  5,000 Units Subcutaneous Q8H   lisinopril  10 mg Oral QPM   mouth rinse  15 mL Mouth Rinse BID   multivitamin  15 mL Oral Daily   polyethylene glycol  17 g Oral BID   senna  1 tablet Oral Daily   Continuous Infusions:  methocarbamol (ROBAXIN) IV       LOS: 11 days   Little Ishikawa, DO [facility] Pager: Secure chat  7 PM-7 AM- [email or web address] 01/20/2021, 7:57 AM

## 2021-01-20 NOTE — Consult Note (Signed)
Chief Complaint: Patient was seen in consultation today for percutaneous gastrostomy placement  Referring Physician(s): Holli Humbles, MD  Supervising Physician: Markus Daft  Patient Status: [employer] - In-pt  History of Present Illness: Cody Carr is a 75 y.o. male with a past medical history significant for HTN, HLD, prostate s/p seeding and throat cancer s/p chemo and radiation (2007) who presented to [facility] ED on 01/08/21 with complaints of back pain, constipation and poor oral intake. Initial evaluation noted a large necrotic mass in the right lower lobe extending into the liver and diaphragm, he was transferred to [facility] for lung biopsy in IR on 9/8 and ultimately transferred to [facility] on 9/14 for ongoing evaluation from oncology as well as radiation therapy. 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 He has had several episodes of coughing/aspiration and is unable to tolerate sufficient oral intake, IR has been consulted for percutaneous g-tube placement for ongoing nutritional needs.  Patient seen in his room, daughter at bedside who provides much of the history as patient has a difficult time talking - she notes patient previously had a g-tube when he was undergoing treatments for tongue cancer so they are aware of the procedure and wish to move forward. She reports patient has significant anxiety regarding the NG as he fears this would be permanent - we reviewed today that the NG would only be placed in order for him to receive contrast and then could be removed shortly after that, he is agreeable to this. He does not want to try drinking the contrast orally. His daughter is also asking for some anxiety medicine prior to NG placement.  Past Medical History:  Diagnosis Date  . Dyspnea    with exercise  . History of kidney stones   . Hyperlipemia   . Hypertension   . Prostate cancer ([place])   . Throat cancer ([place]) 2008    treated with Chemo and Radiation    Past Surgical History:  Procedure Laterality Date  . dental implants  2008  . EXTRACORPOREAL SHOCK WAVE LITHOTRIPSY Right 03/06/2020   Procedure: EXTRACORPOREAL SHOCK WAVE LITHOTRIPSY (ESWL);  Surgeon: Cleon Gustin, MD;  Location: [facility];  Service: Urology;  Laterality: Right;  . EYE SURGERY Bilateral 2021   ioc lens for cataracts  . RADIOACTIVE SEED IMPLANT N/A 07/27/2020   Procedure: RADIOACTIVE SEED IMPLANT/BRACHYTHERAPY IMPLANT;  Surgeon: Irine Seal, MD;  Location: [facility];  Service: Urology;  Laterality: N/A;  63 SEEDS  . SPACE OAR INSTILLATION N/A 07/27/2020   Procedure: SPACE OAR INSTILLATION;  Surgeon: Irine Seal, MD;  Location: [facility];  Service: Urology;  Laterality: N/A;  . throat biopsy  2008    Allergies: Patient has no known allergies.  Medications: Prior to Admission medications   Medication Sig Start Date End Date Taking? Authorizing Provider  acetaminophen (TYLENOL) 500 MG tablet Take 1,000 mg by mouth every 6 (six) hours as needed.   Yes [provider]  aspirin 81 MG EC tablet Take 81 mg by mouth daily. 03/01/08  Yes [provider]  Biotin 5000 MCG CAPS Take 5,000 mcg by mouth daily.   Yes [provider]  Cholecalciferol (VITAMIN D3) 50 MCG (2000 UT) TABS Take 2,000 Units by mouth daily.   Yes [provider]  Cyanocobalamin (VITAMIN B-12) 5000 MCG SUBL Take 5,000 mcg by mouth daily.   Yes [provider]  docusate sodium (COLACE) 100 MG capsule Take  100 mg by mouth 2 (two) times daily.   Yes [provider]  HYDROcodone-acetaminophen (NORCO/VICODIN) 5-325 MG tablet Take 1 tablet by mouth every 6 (six) hours as needed for moderate pain. 07/27/20 07/27/21 Yes Irine Seal, MD  linaclotide [facility]) 145 MCG CAPS capsule Take 145 mcg by mouth daily before breakfast.   Yes [provider]  Multiple Vitamin (MULTIVITAMIN WITH  MINERALS) TABS tablet Take 1 tablet by mouth daily.   Yes [provider]  ondansetron (ZOFRAN) 4 MG tablet Take 4 mg by mouth every 8 (eight) hours as needed for nausea or vomiting.   Yes [provider]  polyethylene glycol (MIRALAX / GLYCOLAX) 17 g packet Take 17 g by mouth daily.   Yes [provider]  SYMBICORT 80-4.5 MCG/ACT inhaler Inhale 2 puffs into the lungs 2 (two) times daily. Rinse mouth after use 01/04/21  Yes [provider]  lisinopril (ZESTRIL) 2.5 MG tablet Take 2.5 mg by mouth every evening.  Patient not taking: Reported on 01/08/2021    [provider]  naproxen sodium (ALEVE) 220 MG tablet Take 220-440 mg by mouth daily as needed (pain). Patient not taking: No sig reported    [provider]  rosuvastatin (CRESTOR) 10 MG tablet Take 5 mg by mouth every evening. Patient not taking: Reported on 01/08/2021    [provider]     Family History  Problem Relation Age of Onset  . Prostate cancer Neg Hx   . Breast cancer Neg Hx   . Colon cancer Neg Hx   . Pancreatic cancer Neg Hx     Social History   Socioeconomic History  . Marital status: Married    Spouse name: Not on file  . Number of children: 1  . Years of education: Not on file  . Highest education level: Not on file  Occupational History  . Not on file  Tobacco Use  . Smoking status: Former    Packs/day: 0.50    Years: 12.00    Pack years: 6.00    Types: Cigarettes    Quit date: 1980    Years since quitting: 42.7  . Smokeless tobacco: Never  Vaping Use  . Vaping Use: Never used  Substance and Sexual Activity  . Alcohol use: Not Currently  . Drug use: Never  . Sexual activity: Not Currently    Comment: erectile dysfunction  Other Topics Concern  . Not on file  Social History Narrative  . Not on file   Social Determinants of Health   Financial Resource Strain: Not on file  Food Insecurity: Not on file  Transportation Needs: Not on file   Physical Activity: Not on file  Stress: Not on file  Social Connections: Not on file     Review of Systems: A 12 point ROS discussed and pertinent positives are indicated in the HPI above.  All other systems are negative.  Review of Systems  Respiratory:  Positive for cough.   Cardiovascular:  Negative for chest pain.  Gastrointestinal:  Negative for abdominal pain, nausea and vomiting.  Psychiatric/Behavioral:  The patient is nervous/anxious.    Vital Signs: BP 138/85 (BP Location: Right Arm)   Pulse 61   Temp 98.3 F (36.8 C) (Oral)   Resp 16   Ht 6\' 1"  (1.854 m)   Wt 168 lb 10.4 oz (76.5 kg)   SpO2 100%   BMI 22.25 kg/m   Physical Exam Vitals and nursing note reviewed.  Constitutional:  General: He is not in acute distress. HENT:     Head: Normocephalic.  Cardiovascular:     Rate and Rhythm: Normal rate and regular rhythm.  Pulmonary:     Effort: Pulmonary effort is normal.     Comments: (+) supplemental oxygen via [place] Abdominal:     Palpations: Abdomen is soft.  Skin:    General: Skin is warm and dry.  Neurological:     Mental Status: He is alert and oriented to person, place, and time.  Psychiatric:        Mood and Affect: Mood normal.        Behavior: Behavior normal.        Thought Content: Thought content normal.        Judgment: Judgment normal.     MD Evaluation Airway: WNL Heart: WNL Abdomen: WNL Chest/ Lungs: WNL ASA  Classification: 3 Mallampati/Airway Score: One   Imaging: CT Angio Chest PE W and/or Wo Contrast  Result Date: 01/08/2021 CLINICAL DATA:  Chest pain.  High probability for PE. EXAM: CT ANGIOGRAPHY CHEST WITH CONTRAST TECHNIQUE: Multidetector CT imaging of the chest was performed using the standard protocol during bolus administration of intravenous contrast. Multiplanar CT image reconstructions and MIPs were obtained to evaluate the vascular anatomy. CONTRAST:  174mL OMNIPAQUE IOHEXOL 350 MG/ML SOLN COMPARISON:  Prior CT  scan of the chest 04/05/2020 FINDINGS: Cardiovascular: Satisfactory opacification of the pulmonary arteries to the segmental level. No evidence of pulmonary embolism. Normal heart size. No pericardial effusion. Fusiform aneurysmal dilation of the ascending thoracic aorta with a maximal transverse diameter of 4.5 cm. Scattered atherosclerotic calcifications throughout the aorta and the native coronary arteries. Mediastinum/Nodes: Unremarkable thyroid gland. High right paratracheal lymph node measures 1.4 cm in short axis (image 22 series 4). Left paratracheal lymph node measures 1.5 cm in short axis (image 34 series 4). Subcarinal lymphadenopathy measures approximately 2.2 cm in short axis (image 42 series 4). Posterior left mediastinal mass measures a proximally 4.5 x 2.8 x 3.8 cm indirectly invades the head and neck of the left third rib. The mass also extends into the neural foramen. Lungs/Pleura: Approximally 9.0 x 8.0 x 8.7 cm ill-defined heterogeneous mass centered in the posteromedial aspect of the right lower lobe contains numerous punctate internal calcifications. The mass also appears to extend across the diaphragm and directly into segment 7 of the liver. There is a small and likely malignant associated right pleural effusion. 0.6 cm right middle lobe pulmonary nodule (image 73 series 6) is indeterminate. 2 mm nodule more superiorly on image 67 also noted. Peripheral subpleural reticulation, architectural distortion and honeycombing in the left lung base suggests underlying pulmonary fibrosis. Upper Abdomen: No acute abnormality. Musculoskeletal: Soft tissue lytic lesion destroying the head and neck of the left third rib with extension into the neural foramen as described above. No additional foci of osseous metastatic disease identified. Review of the MIP images confirms the above findings. IMPRESSION: 1. Approximally 9 cm ill-defined heterogeneous right lower lobe mass appears to cross the diaphragm and  directly invade segment 7 of the liver. Findings are highly concerning for advanced primary bronchogenic carcinoma. 2. Lytic soft tissue metastasis in the left posterior mediastinum centered on the head and neck of the left third rib. The mass encroaches into the left T3-T4 neural foramen. 3. Presumably metastatic mediastinal lymphadenopathy. 4. Small right-sided pleural effusion is likely malignant. 5. Nonspecific right middle lobe pulmonary nodules measuring 0.6 and 0.2 cm. Small pulmonary metastases versus incidental benign nodules. 6.  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 7. 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 Ascending thoracic aortic aneurysm. Recommend semi-annual imaging followup by CTA or MRA and referral to cardiothoracic surgery if not already obtained. This recommendation follows 2010 ACCF/AHA/AATS/ACR/ASA/SCA/SCAI/SIR/STS/SVM Guidelines for the Diagnosis and Management of Patients With Thoracic Aortic Disease. Circulation. 2010; 121: O160-V371. Aortic aneurysm NOS (ICD10-I71.9); Aortic Atherosclerosis (ICD10-I70.0). 8. Coronary artery calcifications. Electronically Signed   By: Jacqulynn Cadet M.D.   On: 01/08/2021 17:09   MR BRAIN W WO CONTRAST  Result Date: 01/12/2021 CLINICAL DATA:  Initial evaluation for metastatic disease. EXAM: MRI HEAD WITHOUT AND WITH CONTRAST TECHNIQUE: Multiplanar, multiecho pulse sequences of the brain and surrounding structures were obtained without and with intravenous contrast. CONTRAST:  77mL GADAVIST GADOBUTROL 1 MMOL/ML IV SOLN COMPARISON:  None available. FINDINGS: Brain: Examination mildly degraded by motion artifact. Generalized age-related cerebral atrophy. Patchy T2/FLAIR hyperintensity within the periventricular deep white matter both cerebral hemispheres most consistent with chronic small vessel ischemic disease, mild to moderate in nature. No abnormal foci of restricted diffusion to suggest acute  or subacute ischemia. Gray-white matter differentiation maintained. No encephalomalacia to suggest chronic cortical infarction. No evidence for acute or chronic intracranial hemorrhage. No mass lesion, midline shift or mass effect. No hydrocephalus or extra-axial fluid collection. Pituitary gland suprasellar region normal. Midline structures intact. No abnormal enhancement. No evidence for intracranial metastatic disease. Vascular: Major intracranial vascular flow voids are maintained. Skull and upper cervical spine: Craniocervical junction within normal limits. Bone marrow signal intensity normal. No visible focal marrow replacing lesion. No scalp soft tissue abnormality. Sinuses/Orbits: Patient status post bilateral ocular lens replacement. Globes and orbital soft tissues demonstrate no other acute finding. Paranasal sinuses are largely clear. Small left mastoid effusion, of doubtful significance. Inner ear structures grossly normal. Other: None. IMPRESSION: 1. No evidence for intracranial metastatic disease. 2. Age-related cerebral atrophy with mild to moderate chronic microvascular ischemic disease. Electronically Signed   By: Jeannine Boga M.D.   On: 01/12/2021 01:31   MR THORACIC SPINE W WO CONTRAST  Result Date: 01/11/2021 CLINICAL DATA:  Initial evaluation for metastatic workup. EXAM: MRI THORACIC WITHOUT AND WITH CONTRAST TECHNIQUE: Multiplanar and multiecho pulse sequences of the thoracic spine were obtained without and with intravenous contrast. CONTRAST:  30mL GADAVIST GADOBUTROL 1 MMOL/ML IV SOLN COMPARISON:  Prior CTs from 01/08/2021. FINDINGS: Alignment: Physiologic with preservation of the normal thoracic kyphosis. No listhesis. Vertebrae: Large metastatic implant involving the left aspect of the T1 and T2 vertebral bodies is seen. Lesion measures approximately 5.1 x 3.2 x 3.7 cm (series 19, image 8). Involvement of the adjacent left posterior T2 and T3 ribs noted. Invasion of the adjacent  left T2-3 and T3-4 neural foramina. The left T2-3 neural foramen is largely obliterated, with moderate narrowing at the left T3-4 neural foramen. Mild flattening of the left aspect of the adjacent thecal sac without significant stenosis or additional epidural invasion at this time. No associated pathologic fracture. Additional 1.5 cm osseous metastasis seen at the right anterior aspect of T7 (series 22, image 9). Large metastatic implant involving the right posterior L1 vertebral body as well as the adjacent right posterior elements partially visualized (series 22, image 9). This lesion abuts the right aspect of the thecal sac at the level of T12-L1 (series 18, image 39). No other significant metastatic disease seen elsewhere within the thoracic spine at this time. Vertebral body height maintained. Underlying bone marrow signal intensity diffusely heterogeneous. No  evidence for osteomyelitis discitis or septic arthritis. Cord: Normal signal and morphology. Other than the above described lesions at T2-3/T3-4 and L1 abutting the thecal sac, no other discernible epidural involvement. No other abnormal enhancement. Paraspinal and other soft tissues: Large necrotic mass positioned at the posterior right lung base again seen. Direct extension through the right hemidiaphragm into the adjacent liver noted. Associated irregular right pleural effusion. Right hilar and subcarinal adenopathy. Additional trace left pleural effusion. Findings better evaluated on prior CT. Probable cholelithiasis noted as well. Disc levels: Tiny right paracentral disc protrusion at T7-8 minimally flattens the right ventral thecal sac without significant stenosis. Otherwise, no other significant disc pathology seen within the thoracic spine. No spinal stenosis. Mild bilateral foraminal narrowing noted at T10-11 due to facet disease. IMPRESSION: 1. Large metastatic implant involving the left aspect of the T1 and T2 vertebral bodies with invasion of  the adjacent left T2-3 and T3-4 neural foramina. 2. Additional 1.5 cm metastatic implant involving the right anterior aspect of T7. 3. Large metastatic implant involving the right posterior L1 vertebral body and adjacent posterior elements, partially visualized. 4. No other significant metastatic involvement within the thoracic spine at this time. No other epidural involvement. No pathologic fracture. 5. Large necrotic mass at the posterior right lung base with invasion of the adjacent right hemidiaphragm and liver, with associated right pleural effusion and mediastinal adenopathy. Findings better evaluated on prior CT. Electronically Signed   By: Jeannine Boga M.D.   On: 01/11/2021 04:15   CT ABDOMEN PELVIS W CONTRAST  Result Date: 01/08/2021 CLINICAL DATA:  Left lower quadrant pain EXAM: CT ABDOMEN AND PELVIS WITH CONTRAST TECHNIQUE: Multidetector CT imaging of the abdomen and pelvis was performed using the standard protocol following bolus administration of intravenous contrast. CONTRAST:  125mL OMNIPAQUE IOHEXOL 350 MG/ML SOLN COMPARISON:  CT 01/19/2020, chest CT 01/08/2021, FINDINGS: Lower chest: Mild aneurysmal dilatation of the ascending aorta measuring up to 4.4 cm, heavily calcified. Extensive coronary vascular calcification. Normal cardiac size. Trace pericardial effusion. Incompletely visualized necrotic appearing subcarinal lymph node measuring up to 2.6 cm. Large necrotic appearing mass in the medial right base with punctate calcifications, this measures approximately 9.6 by 7.8 cm and extends across the right diaphragm, into the adjacent liver. Thick rimmed hypodense enhancing lesion in the right hepatic lobe measuring 3.8 cm. Small right-sided pleural effusion Hepatobiliary: No calcified gallstone. No biliary dilatation. Heterogenous enhancement of right hepatic lobe adjacent to mass lesion. Pancreas: Unremarkable. No pancreatic ductal dilatation or surrounding inflammatory changes. Spleen:  Indeterminate hypoenhancing mass within the spleen measuring 2.4 cm, series 2, image 34. Adrenals/Urinary Tract: Adrenal glands are normal. Punctate nonobstructing stones within the bilateral kidneys. Scattered renal cysts. Interim finding of 14 mm indeterminate right renal lesion at the midpole, series 2, image 52. Interim finding of indeterminate 13 mm exophytic lesion mid pole left kidney, series 2, image 44. Slightly thick-walled urinary bladder. 4 mm stone within the right posterior bladder. Stomach/Bowel: The stomach is nonenlarged. No dilated small bowel. No acute bowel wall thickening. Moderate to large stool in the rectosigmoid colon with moderate formed feces at the rectum. Negative appendix Vascular/Lymphatic: Advanced aortic atherosclerosis. No aneurysm. No suspicious nodes. Reproductive: Post treatment changes of the prostate gland Other: Presacral edema and soft tissue stranding.  No free air. Musculoskeletal: Lytic lesion and soft tissue mass involving the L1 vertebra right pedicle, lamina, vertebral body and transverse process. Soft tissue mass involves the paraspinal soft tissues as well. 2.5 cm hypodense lesion in the  right paraspinal soft tissues posterior to right transverse process at L2, series 2, image 49. Large lytic lesion L5 vertebral body. Chronic bilateral pars defect at L5. 5.5 x 4.3 cm enhancing heterogenous soft tissue mass in the left gluteus muscles, series 2, image 93. IMPRESSION: 1. Small right-sided pleural effusion with large necrotic appearing mass in the posteromedial right lower lobe that appears to traverse the right diaphragm and extends into the adjacent liver. 2. Lytic lesion with associated soft tissue mass at L1 with additional lytic lesion at L5 vertebral body consistent with metastatic disease. 5.5 cm heterogenous enhancing soft tissue mass in the left gluteus muscles also suspect for metastatic disease. Small hypodense right paraspinal soft tissue mass posterior to  transverse process of L2, concerning for metastatic disease 3. Vague hypodensity within the mid spleen is indeterminate for infarct or hypodense mass lesion. 4. Interval development of bilateral indeterminate renal lesions measuring up to 13 mm. 5. Nonobstructing kidney stones.  4 mm stone in the bladder Electronically Signed   By: Donavan Foil M.D.   On: 01/08/2021 17:39   CT L-SPINE NO CHARGE  Result Date: 01/08/2021 CLINICAL DATA:  Back pain. EXAM: CT LUMBAR SPINE WITH CONTRAST TECHNIQUE: Technique: Multiplanar CT images of the lumbar spine were reconstructed from contemporary CT of the Abdomen and Pelvis. CONTRAST:  No additional COMPARISON:  CT abdomen and pelvis 01/19/2020 FINDINGS: Segmentation: 5 lumbar type vertebrae. Alignment: Normal. Vertebrae: Chronic bilateral L5 pars defects. 5 cm destructive mass involving the right-sided posterior elements and posterior vertebral body of L1 mildly encroaching upon the right L1-2 neural foramen. 3.7 cm lytic lesion in the left aspect of the L5 vertebral body with focal disruption of the posterior vertebral body cortex and likely small volume epidural tumor in the left lateral recess. Preserved vertebral body heights. Paraspinal and other soft tissues: 2.5 cm hypodense mass in the paraspinal soft tissues posterior to the right L2 transverse process. Intra-abdominal and pelvic contents reported separately. Disc levels: Mild lumbar spondylosis and facet arthrosis without high-grade spinal stenosis. Mild-to-moderate multilevel neural foraminal stenosis. IMPRESSION: 1. Destructive bone lesions at L1 and L5 consistent with metastatic disease. Suspected small volume epidural tumor in the left lateral recess at L5. 2. 2.5 cm paraspinal soft tissue mass posterior to the right L2 transverse process also concerning for metastatic disease. Electronically Signed   By: Logan Bores M.D.   On: 01/08/2021 18:16   DG Chest Port 1 View  Result Date: 01/10/2021 CLINICAL DATA:   Status post biopsy of right lower lobe lung mass. EXAM: PORTABLE CHEST 1 VIEW COMPARISON:  CT chest 01/08/2021 FINDINGS: Intentionally extra tori image of the chest demonstrates no pneumothorax. Low lung volumes. Bandlike density along the right mid lung attributable to fluid in the minor fissure. Retro diaphragmatic density on the right compatible with known right lower lobe lesion. Stable mild peripheral interstitial accentuation in the lungs. Atherosclerotic calcification of the aortic arch. Degenerative glenohumeral arthropathy, left greater than right, with some volume loss in the left humeral head. Thoracic spondylosis noted. The known lytic lesion involving the left third rib and left third and fourth vertebra is relatively difficult to visualize on today's conventional radiograph. IMPRESSION: 1. No pneumothorax. 2. Trace fluid in the right minor fissure. 3. Mild chronic interstitial accentuation in the lung periphery. 4. Left greater than right glenohumeral arthropathy. 5.  Aortic Atherosclerosis (ICD10-I70.0). 6. Retro diaphragmatic density on the right compatible with known mass. Electronically Signed   By: Van Clines M.D.   On:  01/10/2021 14:51   CT LUNG MASS BIOPSY  Result Date: 01/10/2021 INDICATION: 75 year old male with indeterminate right lower lobe lung mass. EXAM: CT-guided lung biopsy COMPARISON:  CT chest from 01/08/2021 MEDICATIONS: None. ANESTHESIA/SEDATION: Fentanyl 50 mcg IV; Versed 1 mg IV Sedation time: 13 minutes; The patient was continuously monitored during the procedure by the interventional radiology nurse under my direct supervision. CONTRAST:  None COMPLICATIONS: None immediate. PROCEDURE: Informed consent was obtained from the patient following an explanation of the procedure, risks, benefits and alternatives. The patient understands,agrees and consents for the procedure. All questions were addressed. A time out was performed prior to the initiation of the procedure. The  patient was positioned in the left lateral decubitus position on the CT table and a limited chest CT was performed for procedural planning demonstrating similar-appearing right lower lobe mass. The operative site was prepped and draped in the usual sterile fashion. Under sterile conditions and local anesthesia, a 17 gauge coaxial needle was advanced into the peripheral aspect of the nodule. Positioning was confirmed with intermittent CT fluoroscopy and followed by the acquisition of total of 3 core samples with an 18 gauge core needle biopsy device. The coaxial needle was removed following deployment of a [employer] plug and superficial hemostasis was achieved with manual compression. Limited post procedural chest CT was negative for pneumothorax or additional complication. A dressing was placed. The patient tolerated the procedure well without immediate postprocedural complication. The patient was escorted to have an upright chest radiograph. IMPRESSION: Technically successful CT guided core needle core biopsy of right lower lung mass. Ruthann Cancer, MD [facility] Radiology Electronically Signed   By: Ruthann Cancer M.D.   On: 01/10/2021 13:47    Labs:  CBC: Recent Labs    01/14/21 0131 01/16/21 0243 01/18/21 0456 01/20/21 0608  WBC 22.7* 29.7* 25.2* 24.5*  HGB 8.6* 10.0* 10.5* 10.8*  HCT 28.0* 31.8* 33.7* 34.1*  PLT 349 468* 467* 412*    COAGS: Recent Labs    07/25/20 0921 01/10/21 0804  INR 1.1 1.5*  APTT 37*  --     BMP: Recent Labs    01/16/21 0243 01/17/21 0535 01/18/21 0456 01/20/21 0608  NA 134* 135 134* 135  K 3.9 4.0 3.9 5.0  CL 101 100 99 99  CO2 25 26 26 29   GLUCOSE 146* 131* 131* 130*  BUN 11 15 16 15   CALCIUM 7.9* 7.9* 8.1* 8.1*  CREATININE 0.56* 0.49* 0.53* 0.40*  GFRNONAA >60 >60 >60 >60    LIVER FUNCTION TESTS: Recent Labs    01/09/21 0522 01/10/21 0126 01/11/21 0509 01/13/21 0047  BILITOT 1.0 1.2 1.5*  1.2  AST 15 14* 13* 17  ALT 17 15 15 17   ALKPHOS 157* 124 118 120  PROT 5.3* 4.7* 4.7* 4.5*  ALBUMIN 2.0* 1.6* 1.6* 1.5*    TUMOR MARKERS: No results for input(s): AFPTM, CEA, CA199, CHROMGRNA in the last 8760 hours.  Assessment and Plan:  75 y/o M with history of prostate cancer s/p seeding and tongue cancer s/p chemoradiation (2007) who presented initially to [facility] with complaints of poor oral intake, back pain and constipation. 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 He has been unable to tolerate sufficient PO intake and IR has been consulted for percutaneous gastrostomy tube placement.  Patient seen  today with his daughter - they are both very much in agreement with proceeding witht the g-tube placement however they express concerns about patient's anxiety regarding NG placement, he does not want to try drinking contrast as he is afraid of aspirating. Discussed that the NG can be placed just prior to contrast administration and removed shortly after. His daughter is also requesting something for anxiety prior to NG placement.  Plan: - RN to place NG and give entire bottle (100 mL) Omnipaque 350 via NG between 6-8 pm tonight, may remove NG after 30 minutes if patient is able to keep contrast down (call CT or fluoro to have contrast sent to the floor) - Order placed for Ativan 1 mg IV x 1 PRN prior to NG placement - Patient to be NPO at midnight (except sips with meds) - Hold 0600 dose of heparin SQ on 9/19 - IR will call for patient when ready, they have requested this be as early as possible and I have relayed this request to the IR team  Risks and benefits discussed with the patient including, but not limited to the need for a barium enema during the procedure, bleeding, infection, peritonitis, or damage  to adjacent structures.  All of the patient's questions were answered, patient is agreeable to proceed.  Consent signed and in chart.  Thank you for this interesting consult.  I greatly enjoyed meeting SEON GAERTNER and look forward to participating in their care.  A copy of this report was sent to the requesting provider on this date.  Electronically Signed: Joaquim Nam, PA-C 01/20/2021, 2:57 PM   I spent a total of 20 Minutes in face to face in clinical consultation, greater than 50% of which was counseling/coordinating care for G-tube placement.
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
## 2021-01-20 NOTE — Progress Notes (Signed)
The IR PA called to say she was discontinuing the order for the NG tube. She stated the MD who was going to put the peg tube in doesn't think it should be done. The PA and MD will be in to see the Patient and daughter in the morning to speak to them about it. Daughter was very upset they were informed last minute about the change in plans. I explained there was no set time to have the NG placed, it would have been when I could do it. States her father now thinks now he's going to die because the NG is not being put in.Ispoke with her dad that the MD and PA want to talk to him about first.

## 2021-01-21 ENCOUNTER — Encounter (HOSPITAL_COMMUNITY): Payer: Self-pay | Admitting: Internal Medicine

## 2021-01-21 ENCOUNTER — Ambulatory Visit: Payer: Medicare PPO

## 2021-01-21 DIAGNOSIS — C3491 Malignant neoplasm of unspecified part of right bronchus or lung: Secondary | ICD-10-CM | POA: Diagnosis not present

## 2021-01-21 DIAGNOSIS — E440 Moderate protein-calorie malnutrition: Secondary | ICD-10-CM | POA: Diagnosis not present

## 2021-01-21 LAB — GLUCOSE, CAPILLARY: Glucose-Capillary: 130 mg/dL — ABNORMAL HIGH (ref 70–99)

## 2021-01-21 MED ORDER — HYDROXYZINE HCL 50 MG/ML IM SOLN: 25.0000 mg | Freq: Four times a day (QID) | INTRAMUSCULAR | Status: DC | PRN

## 2021-01-21 MED ORDER — HALOPERIDOL LACTATE 5 MG/ML IJ SOLN
INTRAMUSCULAR | Status: AC
  Filled 2021-01-21: qty 1

## 2021-01-21 MED ORDER — HYDROXYZINE HCL 50 MG/ML IM SOLN
25.0000 mg | Freq: Four times a day (QID) | INTRAMUSCULAR | Status: DC | PRN
  Filled 2021-01-21: qty 0.5

## 2021-01-21 MED ORDER — HALOPERIDOL LACTATE 5 MG/ML IJ SOLN
2.0000 mg | Freq: Once | INTRAMUSCULAR | Status: AC
  Administered 2021-01-21: 2 mg via INTRAMUSCULAR

## 2021-01-21 MED ORDER — HALOPERIDOL LACTATE 5 MG/ML IJ SOLN: 2.0000 mg | Freq: Once | INTRAMUSCULAR | Status: DC

## 2021-01-21 MED ORDER — HYDROMORPHONE HCL 1 MG/ML IJ SOLN
1.0000 mg | INTRAMUSCULAR | Status: DC | PRN
Start: 2021-01-21 — End: 2021-01-24
  Administered 2021-01-21 – 2021-01-24 (×12): 1 mg via INTRAVENOUS
  Filled 2021-01-21 (×12): qty 1

## 2021-01-21 NOTE — TOC Progression Note (Signed)
Transition of Care [facility]) - Progression Note    Patient Details  Name: Cody Carr MRN: [id number] Date of Birth: 1945/12/04  Transition of Care [facility]) CM/SW Contact  Claudy Abdallah, Marjie Skiff, RN Phone Number: 01/21/2021, 12:05 PM  Clinical Narrative:    [facility] consult for referral to [facility]. Referral called and faxed to Cassandra at [place] will continue to follow and assist with dc.

## 2021-01-21 NOTE — Progress Notes (Signed)
Chaplain engaged in an initial visit with "Cody Carr" and his wife.  Chaplain learned that they have been with each other over 45 years.  They met while living in an apartment complex.  They are also a blended family.  [name] described Cody Carr as being a great husband and as loving golf.  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  She also stated that he used to be very active and went to the [facility] to exercise often.  She expressed that Andy's current condition has felt very sudden.  She also talked about time being way too short.  Chaplain offered listening to [name] as Cody Carr rested.  Chaplain was also able to provide prayer over both of them.  Chaplain will follow-up as needed.    01/21/21 1200  Clinical Encounter Type  Visited With Patient and family together  Visit Type Initial;Spiritual support  Spiritual Encounters  Spiritual Needs Prayer

## 2021-01-21 NOTE — Progress Notes (Signed)
Patient ID: Cody Carr, male   DOB: 1946/03/06, 75 y.o.   MRN: [id number] Latest imaging studies were also reviewed by Dr. Kathlene Cote this morning and he agrees with Dr. Anselm Pancoast that pt is NOT suitable candidate for percutaneous G tube placement. There are both small and large bowel loops anterior to the stomach. Consider surgical consult if G tube still needed.

## 2021-01-21 NOTE — Progress Notes (Signed)
Palliative Care Progress Note, Assessment & Plan   Patient Name: Cody Carr       Date: 01/21/2021 DOB: 03/22/1946  Age: 75 y.o. MRN#: [id number] Attending Physician: Lancaster, William C, MD Primary Care Physician: Shah, Ashish, MD Admit Date: 01/08/2021  Reason for Consultation/Follow-up: Establishing goals of care  Subjective: Patient with ongoing functional and cognitive decline, chart reviewed, discussed with daughter, see below.     HPI: 75 y.o. male  with past medical history of throat cancer s/p chemo/radiation, prostate cancer s/p seeds, nephrolithiasis, hypertension, hyperlipidemia admitted on 01/08/2021 with constipation, back pain, poor po intake and concern for underlying lung cancer with concern for multiple sites of metastatic disease.   Plan of Care: I have reviewed medical records including EPIC notes, labs and imaging, assessed the patient and then met with patient, patient's daughter [name], Occupational therapist Holly and Speech therapist Celia  working with patient, to discuss diagnosis, prognosis, GOC, EOL wishes, disposition and options.  Events over the course of the past few days noted, patient with ongoing decline and chart reviewed, discussed with daughter in detail outside the room.   Questions and concerns were addressed. The family was encouraged to call with questions or concerns.   Code Status: Now DNR. Agree.    Prognosis: Possibly 2-3 weeks or less.   Discharge Planning: Hospice house, patient and daughter have elected for [facility] based on where they live.   Recommendations/Plan: Agree with DNR. Comfort measures No artificial feeding such as PEG Residential hospice Prognosis markedly limited to 2-3 weeks in my opinion.     Care plan  was discussed with TOC, [facility] MD,RN and patient's daughter  Length of Stay: 12  Awake alert Appears frail and chronically ill No distress Has supplemental O2  Is thin with muscle wasting Regular work of breathing not labored.           Vital Signs: BP 124/77 (BP Location: Right Arm)   Pulse 91   Temp 98 F (36.7 C)   Resp 18   Ht 6' 1" (1.854 m)   Wt 76.5 kg   SpO2 100%   BMI 22.25 kg/m  SpO2: SpO2: 100 % O2 Device: O2 Device: Nasal Cannula O2 Flow Rate: O2 Flow Rate (L/min): 3 L/min      Palliative Assessment/Data: 30%       Total Time 25 minutes Prolonged Time Billed No   Greater than 50%  of this time was spent counseling and coordinating care related to the above assessment and plan.  Thank you for allowing the Palliative Medicine Team to assist in the care of this patient.    MD.  Palliative Medicine Team Team Phone # [phone] from 7 AM to 7 PM only. After 7 PM, please contact primary service.    

## 2021-01-21 NOTE — Progress Notes (Signed)
Nutrition Brief Note  Chart reviewed. Pt now transitioning to comfort care.  No further nutrition interventions planned at this time.   Jillianne Gamino, MS, RD, LDN Inpatient Clinical Dietitian Contact information available via Amion   

## 2021-01-21 NOTE — Progress Notes (Signed)
PROGRESS NOTE    Cody Carr   BOF:[id number]  DOB: April 27, 1946  DOA: 01/08/2021 PCP: Monico Blitz, MD   Brief Narrative:  Cody Carr is a 75 year old male with history of cancer of base of tongue status post chemoradiation in 2007, prostate cancer with radioactive seed implantation, hypertension, hyperlipidemia and nephrolithiasis.  Initially presented to [facility] for poor p.o. intake back pain constipation imaging revealed large necrotic mass in the right lower lobe extending into the liver and diaphragm. Transferred to [facility] with IR biopsy on 01/10/2021 diagnosed with stage IV non-small cell cancer. Ultimately transitioning to [facility] for further evaluation and treatment with radiation oncology and hematology oncology for ongoing palliative treatment and spinal radiation. Hospital course complicated by malignant hypercalcemia which has improved with treatment  Subjective: Unfortunately patient evaluated by IR late yesterday and informed he cannot have PEG tube placement which unfortunately caused some profound anxiety in both the patient and family, patient subsequently given benzodiazepines which caused profound mental status changes and agitation ultimately requiring Haldol overnight and restraints for safety.  Patient lost IV as well overnight which was difficult to replace given his agitation.  Much more awake alert oriented this morning but not yet back to baseline, review of systems difficult to obtain but patient adamantly noting worsening back pain  Assessment & Plan:  Goals of care  -Given profoundly worsening symptoms, mental status changes and unclear nutritional route family and patient have opted for palliative care treatment and are moving forward with hospice -Whether or not they decide to continue palliative radiation I will defer to radiation oncology -may be worth following if this will improve or mitigate some of the patient's pain -Continue high-dose  steroids, increasing pain regimen to IV Dilaudid, may transition to PCA pump given profound pain despite moderately high doses of morphine over the past 48 hours with seemingly worsening pain -Appreciate insight and recommendations from hospice and palliative care, tentative disposition to hospice house versus home with hospice yet to be determined  Non-small cell carcinoma of lung, stage 4,  with acute hypoxic respiratory failure , right malignant pleural effusion, soft tissue and lytic mets, metastatic mediastinal lymphadenopathy and direct invasion of the liver -Continue supportive care at this point, likely no longer moving forward with treatment but would defer to radiation oncology if pain would be improved with further radiation - Per oncology, the patient is not a good candidate for treatment especially considering his underlying dysphagia and severe malnutrition - Palliative spinal radiation to multilevel spinal mets -Pain remains poorly controlled with liquid and IV morphine, titrating to IV Dilaudid will uptitrate as appropriate -May benefit from PCA pump versus fentanyl patching as well -Continue IV steroids and liquid gabapentin for neuropathic pain as tolerated.  Dysphagia-history of cancer of base of tongue -We will focus on comfort feeding at this point, patient appears to have a quite profound pain with swallowing, will likely keep patient n.p.o. 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 -Previously treated, no longer following labs in the setting of palliative care  Opoid related Constipation-severe -Improved with scheduled MiraLAX and Senokot, previously required Relistor  Post obstructive vs community acquired pneumonia, resolved - He has completed a course of ceftriaxone and azithromycin   Radiculopathy -In the setting of above continue to increase narcotics as appropriate in the setting  of palliative care  Protein-calorie malnutrition, severe with notable hypoalbuminemia -Continue dietary supplements as tolerated, previously  refusing PEG tube placement  Hypokalemia - Well-controlled, follow repeat labs  Essential hypertension - Continue lisinopril and as needed hydralazine  Anemia of chronic disease - Continue to follow intermittently  History of prostate cancer with radioactive seed implantation 3/22 -Last PSA was 2.6 on 10/31/2020  History of nephrolithiasis status post ESWL   Time spent in minutes: 40 DVT prophylaxis: heparin injection 5,000 Units Start: 01/11/21 0600 SCDs Start: 01/08/21 2224  Code Status: DNR Family Communication: Daughter and wife updated at bedside daily Level of Care: Level of care: Telemetry Disposition Plan:  Status is: Inpatient  Remains inpatient appropriate because:Inpatient level of care appropriate due to severity of illness  Dispo: The patient is from: Home              Anticipated d/c is to: To be determined              Patient currently is not medically stable to d/c.   Difficult to place patient No  Consultants:  Oncology IR Palliative care  Procedures:  IR biopsy  Antimicrobials:  Anti-infectives (From admission, onward)    Start     Dose/Rate Route Frequency Ordered Stop   01/09/21 2000  cefTRIAXone (ROCEPHIN) 2 g in sodium chloride 0.9 % 100 mL IVPB        2 g 200 mL/hr over 30 Minutes Intravenous Every 24 hours 01/08/21 2224 01/13/21 2018   01/09/21 2000  azithromycin (ZITHROMAX) 500 mg in sodium chloride 0.9 % 250 mL IVPB        500 mg 250 mL/hr over 60 Minutes Intravenous Every 24 hours 01/08/21 2224 01/13/21 2142   01/08/21 1930  cefTRIAXone (ROCEPHIN) 1 g in sodium chloride 0.9 % 100 mL IVPB        1 g 200 mL/hr over 30 Minutes Intravenous  Once 01/08/21 1929 01/08/21 2053   01/08/21 1930  azithromycin (ZITHROMAX) 500 mg in sodium chloride 0.9 % 250 mL IVPB        500 mg 250 mL/hr over 60 Minutes  Intravenous  Once 01/08/21 1929 01/08/21 2219        Objective: Vitals:   01/20/21 1839 01/20/21 2107 01/21/21 0133 01/21/21 0506  BP: (!) 166/87 (!) 141/85 (!) 141/97 124/77  Pulse:  (!) 49 99 91  Resp:  18  18  Temp:  97.8 F (36.6 C)  98 F (36.7 C)  TempSrc:      SpO2:  (!) 83% 100%   Weight:      Height:        Intake/Output Summary (Last 24 hours) at 01/21/2021 0806 Last data filed at 01/20/2021 1100 Gross per 24 hour  Intake 235 ml  Output 425 ml  Net -190 ml    Filed Weights   01/17/21 0455 01/18/21 0711 01/20/21 0554  Weight: 80.2 kg 78.4 kg 76.5 kg    Examination: General exam: Moderate distress, pain poorly controlled as above HEENT: PERRLA, oral mucosa moist, no sclera icterus or thrush Respiratory system: Clear to auscultation. Respiratory effort normal. Cardiovascular system: S1 & S2 heard, RRR.   Gastrointestinal system: Abdomen soft, non-tender, nondistended. Normal bowel sounds. Central nervous system: Alert and oriented. No focal neurological deficits. Extremities: No cyanosis, clubbing or edema Skin: No rashes or ulcers Psychiatry:  Mood & affect appropriate.   Data Reviewed: I have personally reviewed following labs and imaging studies  CBC: Recent Labs  Lab 01/16/21 0243 01/18/21 0456 01/20/21 0608  WBC 29.7* 25.2* 24.5*  HGB 10.0* 10.5* 10.8*  HCT 31.8* 33.7* 34.1*  MCV 86.2 86.2 86.5  PLT 468* 467* 412*    Basic Metabolic Panel: Recent Labs  Lab 01/15/21 0137 01/16/21 0243 01/17/21 0535 01/18/21 0456 01/20/21 0608  NA 135 134* 135 134* 135  K 3.4* 3.9 4.0 3.9 5.0  CL 101 101 100 99 99  CO2 25 25 26 26 29   GLUCOSE 110* 146* 131* 131* 130*  BUN 5* 11 15 16 15   CREATININE 0.57* 0.56* 0.49* 0.53* 0.40*  CALCIUM 7.9* 7.9* 7.9* 8.1* 8.1*    GFR: Estimated Creatinine Clearance: 86.3 mL/min (A) (by C-G formula based on SCr of 0.4 mg/dL (L)). Liver Function Tests: No results for input(s): AST, ALT, ALKPHOS, BILITOT, PROT,  ALBUMIN in the last 168 hours.  No results for input(s): LIPASE, AMYLASE in the last 168 hours. No results for input(s): AMMONIA in the last 168 hours. Coagulation Profile: No results for input(s): INR, PROTIME in the last 168 hours.  Cardiac Enzymes: No results for input(s): CKTOTAL, CKMB, CKMBINDEX, TROPONINI in the last 168 hours. BNP (last 3 results) No results for input(s): PROBNP in the last 8760 hours. HbA1C: No results for input(s): HGBA1C in the last 72 hours. CBG: Recent Labs  Lab 01/21/21 0104  GLUCAP 130*   Lipid Profile: No results for input(s): CHOL, HDL, LDLCALC, TRIG, CHOLHDL, LDLDIRECT in the last 72 hours. Thyroid Function Tests: No results for input(s): TSH, T4TOTAL, FREET4, T3FREE, THYROIDAB in the last 72 hours. Anemia Panel: No results for input(s): VITAMINB12, FOLATE, FERRITIN, TIBC, IRON, RETICCTPCT in the last 72 hours. Urine analysis:    Component Value Date/Time   COLORURINE YELLOW 01/08/2021 1615   APPEARANCEUR CLEAR 01/08/2021 1615   APPEARANCEUR Clear 08/09/2020 1457   LABSPEC 1.025 01/08/2021 1615   PHURINE 6.0 01/08/2021 1615   GLUCOSEU NEGATIVE 01/08/2021 1615   HGBUR NEGATIVE 01/08/2021 [place] 01/08/2021 1615   BILIRUBINUR Negative 08/09/2020 [place] 01/08/2021 [place] 01/08/2021 1615   NITRITE NEGATIVE 01/08/2021 1615   [place] 01/08/2021 1615   Radiology Studies: No results found.  Scheduled Meds:  (feeding supplement) PROSource Plus  30 mL Oral BID BM   dexamethasone (DECADRON) injection  8 mg Intravenous Q8H   feeding supplement  1 Container Oral TID BM   gabapentin  300 mg Oral Q8H   guaiFENesin-dextromethorphan  5 mL Oral Q6H   heparin  5,000 Units Subcutaneous Q8H   lisinopril  10 mg Oral QPM   mouth rinse  15 mL Mouth Rinse BID   multivitamin  15 mL Oral Daily   polyethylene glycol  17 g Oral BID   senna  1 tablet Oral Daily   Continuous  Infusions:  methocarbamol (ROBAXIN) IV       LOS: 12 days   Little Ishikawa, DO [facility] Pager: Secure chat  7 PM-7 AM- [email or web address] 01/21/2021, 8:06 AM
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
## 2021-01-21 NOTE — Consult Note (Signed)
Cody Carr [birth year]  [id number].    Requesting MD: Dr. 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  He has had a prolonged stay secondary to all of these malignant issues, pain control, etc.  He is malnourished and isn't taking in much of anything.  Initially IR was consulted for PEG tube placement but this couldn't be placed as the colon and SB were overlying the stomach.  We were consulted for possible open g-tube.  ROS: ROS: Unable as patient is confused and agitated after getting ativan and haldol overnight.  Family History  Problem Relation Age of Onset   Prostate cancer Neg Hx    Breast cancer Neg Hx    Colon cancer Neg Hx    Pancreatic cancer Neg Hx     Past Medical History:  Diagnosis Date   Dysphagia    Dyspnea    with exercise   History of kidney stones    Hyperlipemia    Hypertension    Non-small cell lung cancer ([place])    Prostate cancer ([place])    Throat cancer ([place]) 2008   treated with Chemo and Radiation    Past Surgical History:  Procedure Laterality Date   dental implants  2008   EXTRACORPOREAL SHOCK WAVE LITHOTRIPSY Right 03/06/2020   Procedure: EXTRACORPOREAL SHOCK WAVE LITHOTRIPSY (ESWL);  Surgeon: Cleon Gustin, MD;  Location: [facility];  Service: Urology;  Laterality: Right;   EYE SURGERY Bilateral 2021   ioc lens for cataracts   PEG PLACEMENT  2009   subsequent removal   RADIOACTIVE SEED IMPLANT N/A 07/27/2020   Procedure: RADIOACTIVE SEED IMPLANT/BRACHYTHERAPY IMPLANT;  Surgeon: Irine Seal, MD;  Location: [facility];  Service: Urology;  Laterality:  N/A;  63 SEEDS   SPACE OAR INSTILLATION N/A 07/27/2020   Procedure: SPACE OAR INSTILLATION;  Surgeon: Irine Seal, MD;  Location: [facility];  Service: Urology;  Laterality: N/A;   throat biopsy  2008    Social History:  reports that he quit smoking about 42 years ago. His smoking use included cigarettes. He has a 6.00 pack-year smoking history. He has never used smokeless tobacco. He reports that he does not currently use alcohol. He reports that he does not use drugs.  Allergies: No Known Allergies  Medications Prior to Admission  Medication Sig Dispense Refill   acetaminophen (TYLENOL) 500 MG tablet Take 1,000 mg by mouth every 6 (six) hours as needed.     aspirin 81 MG EC tablet Take 81 mg by mouth daily.     Biotin 5000 MCG CAPS Take 5,000 mcg by mouth daily.     Cholecalciferol (VITAMIN D3) 50 MCG (2000 UT) TABS Take 2,000 Units by mouth daily.     Cyanocobalamin (VITAMIN B-12) 5000 MCG SUBL Take 5,000 mcg by mouth daily.     docusate sodium (COLACE) 100 MG capsule Take 100 mg by mouth 2 (two) times daily.     HYDROcodone-acetaminophen (NORCO/VICODIN) 5-325 MG tablet Take 1 tablet by mouth every 6 (six) hours as needed for moderate pain. 6 tablet 0   linaclotide (LINZESS) 145 MCG CAPS capsule  Take 145 mcg by mouth daily before breakfast.     Multiple Vitamin (MULTIVITAMIN WITH MINERALS) TABS tablet Take 1 tablet by mouth daily.     ondansetron (ZOFRAN) 4 MG tablet Take 4 mg by mouth every 8 (eight) hours as needed for nausea or vomiting.     polyethylene glycol (MIRALAX / GLYCOLAX) 17 g packet Take 17 g by mouth daily.     SYMBICORT 80-4.5 MCG/ACT inhaler Inhale 2 puffs into the lungs 2 (two) times daily. Rinse mouth after use     lisinopril (ZESTRIL) 2.5 MG tablet Take 2.5 mg by mouth every evening.  (Patient not taking: Reported on 01/08/2021)     naproxen sodium (ALEVE) 220 MG tablet Take 220-440 mg by mouth daily as needed (pain). (Patient not taking: No sig reported)      rosuvastatin (CRESTOR) 10 MG tablet Take 5 mg by mouth every evening. (Patient not taking: Reported on 01/08/2021)       Physical Exam: Blood pressure 124/77, pulse 91, temperature 98 F (36.7 C), resp. rate 18, height $RemoveBe'6\' 1"'TfVtKygTc$  (1.854 m), weight 76.5 kg, SpO2 100 %. General: frail appearing white male who is laying in bed in NAD, but agitated HEENT: head is normocephalic, atraumatic.  Sclera are noninjected.  PERRL.  Ears and nose without any masses or lesions.  Mouth is pink and dry Heart: regular, rate, and rhythm.  Normal s1,s2. No obvious murmurs, gallops, or rubs noted.  Palpable radial and pedal pulses bilaterally Lungs: diffuse rhonchi noted, but respiratory effort is nonlabored Abd: soft, NT, ND, +BS, no masses, hernias, or organomegaly, prior PEG tube site noted. Neuro: agitated.  BUE in restraints and mittens.  Kick legs intermittently and does not follow commands currently Psych: somewhat alert, but confused and agitated   Results for orders placed or performed during the hospital encounter of 01/08/21 (from the past 48 hour(s))  CBC     Status: Abnormal   Collection Time: 01/20/21  6:08 AM  Result Value Ref Range   WBC 24.5 (H) 4.0 - 10.5 K/uL   RBC 3.94 (L) 4.22 - 5.81 MIL/uL   Hemoglobin 10.8 (L) 13.0 - 17.0 g/dL   HCT 34.1 (L) 39.0 - 52.0 %   MCV 86.5 80.0 - 100.0 fL   MCH 27.4 26.0 - 34.0 pg   MCHC 31.7 30.0 - 36.0 g/dL   RDW 16.8 (H) 11.5 - 15.5 %   Platelets 412 (H) 150 - 400 K/uL   nRBC 0.0 0.0 - 0.2 %    Comment: Performed at [facility], [place] [address] [place], [place] [address]  Basic metabolic panel     Status: Abnormal   Collection Time: 01/20/21  6:08 AM  Result Value Ref Range   Sodium 135 135 - 145 mmol/L   Potassium 5.0 3.5 - 5.1 mmol/L   Chloride 99 98 - 111 mmol/L   CO2 29 22 - 32 mmol/L   Glucose, Bld 130 (H) 70 - 99 mg/dL    Comment: Glucose reference range applies only to samples taken after fasting for at least 8 hours.    BUN 15 8 - 23 mg/dL   Creatinine, Ser 0.40 (L) 0.61 - 1.24 mg/dL   Calcium 8.1 (L) 8.9 - 10.3 mg/dL   GFR, Estimated >60 >60 mL/min    Comment: (NOTE) Calculated using the CKD-EPI Creatinine Equation (2021)    Anion gap 7 5 - 15    Comment: Performed at [facility], [address]  [place], [place] [address]  Glucose, capillary     Status: Abnormal   Collection Time: 01/21/21  1:04 AM  Result Value Ref Range   Glucose-Capillary 130 (H) 70 - 99 mg/dL    Comment: Glucose reference range applies only to samples taken after fasting for at least 8 hours.   No results found.    Assessment/Plan Dysphagia in the setting of met prostate, NSLC, and throat cancer When I first saw the patient the family wanted to pursue g-tube placement.  We discussed why given the patient's situation that this was not really recommended as their hope was that the tube would  "fix" him.  I very bluntly but nicely informed the family this was not the case.  We also discussed other issues such as persistent aspiration in the setting of him not being able to sit up, as well the risk of him pulling this out causing gastric leakage, sepsis in times of confusion.    Since I left, the family has had discussions with some family friends of theirs that are physicians as well as PCM here.  They have decided now against g-tube placement and further radiation and are going to transition to hospice house and full comfort care.  We will sign off at this time.    Henreitta Cea, [facility] 01/21/2021, 11:08 AM Please see Amion for pager number during day hours 7:00am-4:30pm or 7:00am -11:30am on weekends

## 2021-01-22 ENCOUNTER — Ambulatory Visit: Payer: Medicare PPO

## 2021-01-22 DIAGNOSIS — E440 Moderate protein-calorie malnutrition: Secondary | ICD-10-CM | POA: Diagnosis not present

## 2021-01-22 DIAGNOSIS — C3491 Malignant neoplasm of unspecified part of right bronchus or lung: Secondary | ICD-10-CM | POA: Diagnosis not present

## 2021-01-22 MED ORDER — HYDRALAZINE HCL 20 MG/ML IJ SOLN
5.0000 mg | Freq: Once | INTRAMUSCULAR | Status: AC
  Administered 2021-01-22: 5 mg via INTRAVENOUS
  Filled 2021-01-22: qty 1

## 2021-01-22 NOTE — Progress Notes (Signed)
Physical Therapy Discharge Patient Details Name: Cody Carr MRN: [id number] DOB: 21-Jun-1945 Today's Date: 01/22/2021 Time:  -     Patient discharged from PT services secondary to medical decline - will need to re-order PT to resume therapy services.waiting for transfer to  [facility].   Please see latest therapy progress note for current level of functioning and progress toward goals.     GP     Claretha Cooper 01/22/2021, 11:44 AM [place] Pager [phone] Office [phone]

## 2021-01-22 NOTE — TOC Progression Note (Signed)
Transition of Care [facility]) - Progression Note    Patient Details  Name: Cody Carr MRN: [id number] Date of Birth: 03/17/1946  Transition of Care [facility] CM/SW Contact  Lazaro Isenhower, Marjie Skiff, RN Phone Number: 01/22/2021, 10:15 AM  Clinical Narrative:    TOC contacted by Vito Backers, [facility] liaison. Pt has been approved for residential hospice. However there is not an available bed today. Vito Backers has contacted the daughter to inform. TOC will continue to follow.  Social Determinants of Health (SDOH) Interventions    Readmission Risk Interventions No flowsheet data found.

## 2021-01-22 NOTE — Progress Notes (Signed)
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  Initially presented to [facility] for poor p.o. intake back pain constipation imaging revealed large necrotic mass in the right lower lobe extending into the liver and diaphragm. Transferred to [facility] with IR biopsy on 01/10/2021 diagnosed with stage IV non-small cell cancer. Ultimately transitioning to [facility] for further evaluation and treatment with radiation oncology and hematology oncology for ongoing palliative treatment and spinal radiation. Hospital course complicated by malignant hypercalcemia which has improved with treatment  Subjective: No acute issues or events overnight much more awake alert and oriented this morning side, lengthy discussion about prognosis, attempting to increase patient's Dilaudid dosing with ultimate plan to transition to Dilaudid PCA pump given profound pain and metastatic disease.  He remains unable to tolerate p.o. liquids limiting p.o. analgesia options  Assessment & Plan:  Goals of care  -Given profoundly worsening symptoms, mental status changes and unclear nutritional route family and patient have opted for palliative care treatment and are moving forward with hospice -Whether or not they decide to continue palliative radiation I will defer to radiation oncology -may be worth following if this will improve or mitigate some of the patient's pain -Continue high-dose steroids, increasing pain regimen to IV Dilaudid, may transition to PCA pump given profound pain despite moderately high doses of morphine over the past 48 hours with seemingly worsening pain -Appreciate insight and recommendations from hospice and  palliative care, tentative disposition to hospice house versus home with hospice yet to be determined  Non-small cell carcinoma of lung, stage 4,  with acute hypoxic respiratory failure , right malignant pleural effusion, soft tissue and lytic mets, metastatic mediastinal lymphadenopathy and direct invasion of the liver -Continue supportive care at this point, likely no longer moving forward with palliative treatment -Continue to titrate up IV Dilaudid with plan to transition to Dilaudid PCA pump -Continue IV steroids and liquid gabapentin for neuropathic pain as tolerated -patient refusing p.o. intake even small amounts of liquid medications due to pain  Dysphagia-history of cancer of base of tongue -Essentially n.p.o. at this point, we discussed comfort feeding but patient remains high risk for aspiration and given uncontrolled pain with swallowing he has essentially made himself n.p.o., continue mouth care and ice chips as tolerated  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 -Previously treated, no longer following labs in the setting of palliative care  Opoid related Constipation-severe -Improved with scheduled MiraLAX and Senokot, previously required Relistor  Post obstructive vs community acquired pneumonia, resolved - He has completed a course of ceftriaxone and azithromycin   Radiculopathy -In the setting of above continue to increase narcotics as appropriate in the setting of palliative care  Protein-calorie malnutrition, severe with notable hypoalbuminemia -Continue dietary supplements as tolerated, previously refusing PEG tube placement  Hypokalemia -Previously well controlled, no longer following labs given comfort measures  Essential hypertension -N.p.o. continue comfort measures  Anemia of chronic disease -No further labs, continue comfort measures  Time spent in minutes: 40 DVT prophylaxis: heparin injection 5,000 Units Start: 01/11/21 0600 SCDs Start: 01/08/21 2224 Code  Status: DNR Family Communication: Daughter and wife updated at bedside daily Level of Care: Level of care: Telemetry Disposition Plan:  Status is: Inpatient  Remains inpatient appropriate because:Inpatient level of care appropriate due to severity of illness  Dispo: The patient is from: Home              Anticipated d/c is to: To be determined              Patient currently is not medically stable to d/c.   Difficult to place patient No  Consultants:  Oncology IR Palliative care  Procedures:  IR biopsy  Antimicrobials:  Anti-infectives (From admission, onward)    Start     Dose/Rate Route Frequency Ordered Stop   01/09/21 2000  cefTRIAXone (ROCEPHIN) 2 g in sodium chloride 0.9 % 100 mL IVPB        2 g 200 mL/hr over 30 Minutes Intravenous Every 24 hours 01/08/21 2224 01/13/21 2018   01/09/21 2000  azithromycin (ZITHROMAX) 500 mg in sodium chloride 0.9 % 250 mL IVPB        500 mg 250 mL/hr over 60 Minutes Intravenous Every 24 hours 01/08/21 2224 01/13/21 2142   01/08/21 1930  cefTRIAXone (ROCEPHIN) 1 g in sodium chloride 0.9 % 100 mL IVPB        1 g 200 mL/hr over 30 Minutes Intravenous  Once 01/08/21 1929 01/08/21 2053   01/08/21 1930  azithromycin (ZITHROMAX) 500 mg in sodium chloride 0.9 % 250 mL IVPB        500 mg 250 mL/hr over 60 Minutes Intravenous  Once 01/08/21 1929 01/08/21 2219        Objective: Vitals:   01/21/21 0506 01/21/21 1506 01/21/21 2052 01/22/21 0541  BP: 124/77 126/65 (!) 153/95 (!) 190/97  Pulse: 91 94 82 95  Resp: 18 18 16 18   Temp: 98 F (36.7 C) 98.1 F (36.7 C) (!) 97.4 F (36.3 C) 97.6 F (36.4 C)  TempSrc:  Oral Oral Oral  SpO2:  99% 100% 100%  Weight:    73.8 kg  Height:        Intake/Output Summary (Last 24 hours) at 01/22/2021 0753 Last data filed at 01/22/2021 0751 Gross per 24 hour  Intake 0 ml  Output 600 ml  Net -600 ml    Filed Weights   01/18/21 0711 01/20/21 0554 01/22/21 0541  Weight: 78.4 kg 76.5 kg 73.8 kg     Examination: General exam: Moderate distress, pain poorly controlled as above HEENT: PERRLA, oral mucosa moist, no sclera icterus or thrush Respiratory system: Clear to auscultation. Respiratory effort normal. Cardiovascular system: S1 & S2 heard, RRR.   Gastrointestinal system: Abdomen soft, non-tender, nondistended. Normal bowel sounds. Central nervous system: Alert and oriented. No focal neurological deficits. Extremities: No cyanosis, clubbing or edema Skin: No rashes or ulcers Psychiatry:  Mood & affect appropriate.   Data Reviewed: I have personally reviewed following labs and imaging studies  CBC: Recent Labs  Lab 01/16/21 0243 01/18/21 0456 01/20/21 0608  WBC 29.7* 25.2* 24.5*  HGB 10.0* 10.5* 10.8*  HCT 31.8* 33.7* 34.1*  MCV 86.2 86.2 86.5  PLT 468* 467* 412*    Basic Metabolic Panel: Recent Labs  Lab 01/16/21 0243 01/17/21 0535 01/18/21 0456 01/20/21 0608  NA 134* 135 134* 135  K 3.9 4.0 3.9 5.0  CL 101 100 99 99  CO2 25 26 26 29   GLUCOSE 146* 131* 131* 130*  BUN 11 15 16 15   CREATININE 0.56* 0.49* 0.53* 0.40*  CALCIUM 7.9* 7.9* 8.1* 8.1*    GFR: Estimated Creatinine Clearance: 83.3 mL/min (A) (by C-G formula based on SCr  of 0.4 mg/dL (L)). Liver Function Tests: No results for input(s): AST, ALT, ALKPHOS, BILITOT, PROT, ALBUMIN in the last 168 hours.  No results for input(s): LIPASE, AMYLASE in the last 168 hours. No results for input(s): AMMONIA in the last 168 hours. Coagulation Profile: No results for input(s): INR, PROTIME in the last 168 hours.  Cardiac Enzymes: No results for input(s): CKTOTAL, CKMB, CKMBINDEX, TROPONINI in the last 168 hours. BNP (last 3 results) No results for input(s): PROBNP in the last 8760 hours. HbA1C: No results for input(s): HGBA1C in the last 72 hours. CBG: Recent Labs  Lab 01/21/21 0104  GLUCAP 130*    Lipid Profile: No results for input(s): CHOL, HDL, LDLCALC, TRIG, CHOLHDL, LDLDIRECT in the last 72  hours. Thyroid Function Tests: No results for input(s): TSH, T4TOTAL, FREET4, T3FREE, THYROIDAB in the last 72 hours. Anemia Panel: No results for input(s): VITAMINB12, FOLATE, FERRITIN, TIBC, IRON, RETICCTPCT in the last 72 hours. Urine analysis:    Component Value Date/Time   COLORURINE YELLOW 01/08/2021 1615   APPEARANCEUR CLEAR 01/08/2021 1615   APPEARANCEUR Clear 08/09/2020 1457   LABSPEC 1.025 01/08/2021 1615   PHURINE 6.0 01/08/2021 1615   GLUCOSEU NEGATIVE 01/08/2021 1615   HGBUR NEGATIVE 01/08/2021 [place] 01/08/2021 1615   BILIRUBINUR Negative 08/09/2020 [place] 01/08/2021 [place] 01/08/2021 1615   NITRITE NEGATIVE 01/08/2021 1615   [place] 01/08/2021 1615   Radiology Studies: No results found.  Scheduled Meds:  (feeding supplement) PROSource Plus  30 mL Oral BID BM   dexamethasone (DECADRON) injection  8 mg Intravenous Q8H   feeding supplement  1 Container Oral TID BM   gabapentin  300 mg Oral Q8H   guaiFENesin-dextromethorphan  5 mL Oral Q6H   heparin  5,000 Units Subcutaneous Q8H   lisinopril  10 mg Oral QPM   mouth rinse  15 mL Mouth Rinse BID   multivitamin  15 mL Oral Daily   polyethylene glycol  17 g Oral BID   senna  1 tablet Oral Daily   Continuous Infusions:  methocarbamol (ROBAXIN) IV       LOS: 13 days   Little Ishikawa, DO [facility] Pager: Secure chat  7 PM-7 AM- [email or web address] 01/22/2021, 7:53 AM

## 2021-01-23 ENCOUNTER — Other Ambulatory Visit: Payer: Self-pay

## 2021-01-23 ENCOUNTER — Ambulatory Visit: Payer: Medicare PPO

## 2021-01-23 DIAGNOSIS — C3491 Malignant neoplasm of unspecified part of right bronchus or lung: Secondary | ICD-10-CM | POA: Diagnosis not present

## 2021-01-23 NOTE — Progress Notes (Signed)
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  Initially presented to [facility] for poor p.o. intake back pain constipation imaging revealed large necrotic mass in the right lower lobe extending into the liver and diaphragm. Transferred to [facility] with IR biopsy on 01/10/2021 diagnosed with stage IV non-small cell cancer. Ultimately transitioning to [facility] for further evaluation and treatment with radiation oncology and hematology oncology for ongoing palliative treatment and spinal radiation. Hospital course complicated by malignant hypercalcemia which has improved with treatment  Assessment & Plan:   Principal Problem:   Non-small cell carcinoma of lung, stage 4, right ([place]) Active Problems:   Acute respiratory failure with hypoxia ([place])   CAP (community acquired pneumonia)   Malignant pleural effusion   Constipation   Hypercalcemia   Protein-calorie malnutrition, moderate ([facility])   Pressure injury of skin   Protein-calorie malnutrition, severe   Non small cell ca of lung stage with acute hypoxic respiratory failure, right malignant pleural effusion, soft tissue and lytic mets, metastatic mediastinal lymphadenopathy and direct invasion of the liver.  Palliative consulted , transition to comfort measures.  Continue with IV steroids, liq gabapentin if able to take by mouth and IV pain control.    Dysphagia sec to cancer of base of tongue.  Mouth care and ice chips as tolerated.    Hypercalcemia: sec to malignancy.    Opoid related constipation Severe. , improved with miralax and senokot.    Post obstructive pneumonia:   Completed the course of antibiotics.    Radiculopathy:  On IV narcotics and liq neurontin.    Hypokalemia:  Replaced.    Hypertension;  Comfort measures.    Anemia of chronic disease;  No further labs.     DVT prophylaxis: comfort care.  Code Status: (DNR) Family Communication: family at bedside.  Disposition:   Status is: Inpatient  Remains inpatient appropriate because:Unsafe d/c plan  Dispo: The patient is from: Home              Anticipated d/c is to:  hospice house              Patient currently is medically stable to d/c.   Difficult to place patient No       Consultants:  SURGERY Palliative care  Procedures: none.   Antimicrobials: none.    Subjective: No new complaints.   Objective: Vitals:   01/21/21 1506 01/21/21 2052 01/22/21 0541 01/23/21 0542  BP: 126/65 (!) 153/95 (!) 190/97 140/84  Pulse: 94 82 95 97  Resp: 18 16 18 16   Temp: 98.1 F (36.7 C) (!) 97.4 F (36.3 C) 97.6 F (36.4 C) 98 F (36.7 C)  TempSrc: Oral Oral Oral Oral  SpO2: 99% 100% 100% 94%  Weight:   73.8 kg   Height:        Intake/Output Summary (Last 24 hours) at 01/23/2021 1340 Last data filed at 01/22/2021 1833 Gross per 24 hour  Intake 0 ml  Output 400 ml  Net -400 ml   Filed Weights   01/18/21 0711 01/20/21 0554 01/22/21 0541  Weight: 78.4 kg 76.5 kg 73.8 kg    Examination:  General  exam: Appears calm and comfortable  Respiratory system: Clear to auscultation. Respiratory effort normal. Cardiovascular system: S1 & S2 heard, RRR. No JVD, No pedal edema. Gastrointestinal system: Abdomen is nondistended, soft and nontender.  Normal bowel sounds heard. Central nervous system: sleeping comfortably.  Extremities: No pedal edema.  Skin: No rashes, lesions or ulcers Psychiatry: Unable to assess.     Data Reviewed: I have personally reviewed following labs and imaging studies  CBC: Recent Labs  Lab 01/18/21 0456 01/20/21 0608  WBC 25.2* 24.5*  HGB  10.5* 10.8*  HCT 33.7* 34.1*  MCV 86.2 86.5  PLT 467* 412*    Basic Metabolic Panel: Recent Labs  Lab 01/17/21 0535 01/18/21 0456 01/20/21 0608  NA 135 134* 135  K 4.0 3.9 5.0  CL 100 99 99  CO2 26 26 29   GLUCOSE 131* 131* 130*  BUN 15 16 15   CREATININE 0.49* 0.53* 0.40*  CALCIUM 7.9* 8.1* 8.1*    GFR: Estimated Creatinine Clearance: 83.3 mL/min (A) (by C-G formula based on SCr of 0.4 mg/dL (L)).  Liver Function Tests: No results for input(s): AST, ALT, ALKPHOS, BILITOT, PROT, ALBUMIN in the last 168 hours.  CBG: Recent Labs  Lab 01/21/21 0104  GLUCAP 130*     No results found for this or any previous visit (from the past 240 hour(s)).       Radiology Studies: No results found.      Scheduled Meds:  (feeding supplement) PROSource Plus  30 mL Oral BID BM   dexamethasone (DECADRON) injection  8 mg Intravenous Q8H   feeding supplement  1 Container Oral TID BM   gabapentin  300 mg Oral Q8H   guaiFENesin-dextromethorphan  5 mL Oral Q6H   heparin  5,000 Units Subcutaneous Q8H   mouth rinse  15 mL Mouth Rinse BID   multivitamin  15 mL Oral Daily   polyethylene glycol  17 g Oral BID   senna  1 tablet Oral Daily   Continuous Infusions:  methocarbamol (ROBAXIN) IV       LOS: 14 days        Hosie Poisson, MD [facility]   To contact the attending provider between 7A-7P or the covering provider during after hours 7P-7A, please log into the web site [email or web address] and access using universal [place] password for that web site. If you do not have the password, please call the hospital operator.  01/23/2021, 1:40 PM

## 2021-01-24 ENCOUNTER — Other Ambulatory Visit: Payer: Self-pay | Admitting: *Deleted

## 2021-01-24 ENCOUNTER — Ambulatory Visit: Payer: Medicare PPO

## 2021-01-24 DIAGNOSIS — C3491 Malignant neoplasm of unspecified part of right bronchus or lung: Secondary | ICD-10-CM | POA: Diagnosis not present

## 2021-01-24 DIAGNOSIS — K222 Esophageal obstruction: Secondary | ICD-10-CM | POA: Diagnosis not present

## 2021-01-24 DIAGNOSIS — Z515 Encounter for palliative care: Secondary | ICD-10-CM

## 2021-01-24 LAB — RESP PANEL BY RT-PCR (FLU A&B, COVID) ARPGX2
Influenza A by PCR: NEGATIVE
Influenza B by PCR: NEGATIVE
SARS Coronavirus 2 by RT PCR: NEGATIVE

## 2021-01-24 MED ORDER — GABAPENTIN 250 MG/5ML PO SOLN: 300.0000 mg | Freq: Three times a day (TID) | ORAL | 0 refills | Status: AC

## 2021-01-24 NOTE — Discharge Summary (Signed)
Physician Discharge Summary  Cody Carr IWP:[id number] DOB: 1946-02-13 DOA: 01/08/2021  PCP: Monico Blitz, MD  Admit date: 01/08/2021 Discharge date: 01/24/2021  Admitted From: Home Disposition:  Residential Hospice  Recommendations for Outpatient Follow-up:  Follow up with Hospice MD AS needed  Discharge Condition:hospice CODE STATUS:Comfort Care Diet recommendation: comfort feeds.  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  Initially presented to [facility] for poor p.o. intake back pain constipation imaging revealed large necrotic mass in the right lower lobe extending into the liver and diaphragm. Transferred to [facility] with IR biopsy on 01/10/2021 diagnosed with stage IV non-small cell cancer. Ultimately transitioning to [facility] for further evaluation and treatment with radiation oncology and hematology oncology for ongoing palliative treatment and spinal radiation. Hospital course complicated by malignant hypercalcemia which has improved with treatment  Discharge Diagnoses:  Principal Problem:   Non-small cell carcinoma of lung, stage 4, right ([place]) Active Problems:   Acute respiratory failure with hypoxia ([place])   CAP (community acquired pneumonia)   Malignant pleural effusion   Constipation   Hypercalcemia   Protein-calorie malnutrition, moderate ([facility])   Pressure injury of skin   Protein-calorie malnutrition, severe   Hospice care patient   Palliative care patient  Non small cell ca of lung stage with acute hypoxic respiratory failure, right malignant pleural effusion, soft tissue and lytic mets, metastatic mediastinal lymphadenopathy and direct invasion of the liver.  Palliative consulted , transition to comfort measures.  Continue with IV steroids, liq gabapentin if able  to take by mouth and IV pain control.      Dysphagia sec to cancer of base of tongue.  Mouth care and ice chips as tolerated.      Hypercalcemia: sec to malignancy.      Opoid related constipation Severe. , improved with miralax and senokot.      Post obstructive pneumonia:  Completed the course of antibiotics.      Radiculopathy:  On IV narcotics and liq neurontin.      Hypokalemia:  Replaced.      Hypertension;  Comfort measures.      Anemia of chronic disease;  No further labs.       Discharge Instructions  Discharge Instructions     No wound care   Complete by: As directed       Allergies as of 01/24/2021   No Known Allergies      Medication List     STOP taking these medications    aspirin 81 MG EC tablet   Biotin 5000 MCG Caps   docusate sodium 100 MG capsule Commonly known as: COLACE   HYDROcodone-acetaminophen 5-325 MG tablet Commonly known as: NORCO/VICODIN   Linzess 145 MCG Caps capsule Generic drug: linaclotide   lisinopril 2.5 MG tablet Commonly known as: ZESTRIL   multivitamin with minerals Tabs tablet   naproxen sodium 220 MG tablet Commonly known as: ALEVE   ondansetron 4 MG tablet Commonly known as: ZOFRAN   polyethylene glycol 17 g packet Commonly known as: MIRALAX / GLYCOLAX   rosuvastatin 10 MG tablet Commonly known as: CRESTOR   Symbicort 80-4.5 MCG/ACT inhaler Generic drug: budesonide-formoterol   Vitamin B-12 5000 MCG Subl   Vitamin D3 50 MCG (2000 UT) Tabs       TAKE these medications    acetaminophen 500 MG tablet Commonly known  as: TYLENOL Take 1,000 mg by mouth every 6 (six) hours as needed.   gabapentin 250 MG/5ML solution Commonly known as: NEURONTIN Take 6 mLs (300 mg total) by mouth every 8 (eight) hours.               Durable Medical Equipment  (From admission, onward)           Start     Ordered   01/11/21 1409  For home use only DME lightweight manual wheelchair with  seat cushion  Once       Comments: Patient suffers from metastatic lung cancer which impairs their ability to perform daily activities like bathing, dressing, grooming, toileting in the home.  A walker will not resolve  issue with performing activities of daily living. A wheelchair will allow patient to safely perform daily activities. Patient is not able to propel themselves in the home using a standard weight wheelchair due to general weakness. Patient can self propel in the lightweight wheelchair. Length of need Lifetime. Accessories: elevating leg rests (ELRs), wheel locks, extensions and anti-tippers. Back cushion  high-back reclining   01/11/21 1411   01/11/21 1354  For home use only DME Hospital bed  Once       Question Answer Comment  Length of Need 6 Months   Patient has (list medical condition): metastatic lung cancer   The above medical condition requires: Patient requires the ability to reposition frequently   Bed type Semi-electric   Hoyer Lift Yes   Support Surface: Gel Overlay      01/11/21 1354            Follow-up Information     [facility] [place] Follow up.   Specialty: Home Health Services Why: [facility] services to resume on discharge (RN/PT/OT) Contact information: [place] [address] [phone]         [facility] Follow up.   Why: Hospital bed, lift and wheelchair arranged- to be delivered to the home prior to discharge Contact information: [place] [address] [phone]         Monico Blitz, MD Follow up.   Specialty: Internal Medicine Why: As needed Contact information: [place] [address] [phone]                No Known Allergies  Consultations: Palliative care   Procedures/Studies: CT Angio Chest PE W and/or Wo Contrast  Result Date: 01/08/2021 CLINICAL DATA:  Chest pain.  High probability for PE. EXAM: CT ANGIOGRAPHY CHEST WITH CONTRAST TECHNIQUE:  Multidetector CT imaging of the chest was performed using the standard protocol during bolus administration of intravenous contrast. Multiplanar CT image reconstructions and MIPs were obtained to evaluate the vascular anatomy. CONTRAST:  166mL OMNIPAQUE IOHEXOL 350 MG/ML SOLN COMPARISON:  Prior CT scan of the chest 04/05/2020 FINDINGS: Cardiovascular: Satisfactory opacification of the pulmonary arteries to the segmental level. No evidence of pulmonary embolism. Normal heart size. No pericardial effusion. Fusiform aneurysmal dilation of the ascending thoracic aorta with a maximal transverse diameter of 4.5 cm. Scattered atherosclerotic calcifications throughout the aorta and the native coronary arteries. Mediastinum/Nodes: Unremarkable thyroid gland. High right paratracheal lymph node measures 1.4 cm in short axis (image 22 series 4). Left paratracheal lymph node measures 1.5 cm in short axis (image 34 series 4). Subcarinal lymphadenopathy measures approximately 2.2 cm in short axis (image 42 series 4). Posterior left mediastinal mass measures a proximally 4.5 x 2.8 x 3.8  cm indirectly invades the head and neck of the left third rib. The mass also extends into the neural foramen. Lungs/Pleura: Approximally 9.0 x 8.0 x 8.7 cm ill-defined heterogeneous mass centered in the posteromedial aspect of the right lower lobe contains numerous punctate internal calcifications. The mass also appears to extend across the diaphragm and directly into segment 7 of the liver. There is a small and likely malignant associated right pleural effusion. 0.6 cm right middle lobe pulmonary nodule (image 73 series 6) is indeterminate. 2 mm nodule more superiorly on image 67 also noted. Peripheral subpleural reticulation, architectural distortion and honeycombing in the left lung base suggests underlying pulmonary fibrosis. Upper Abdomen: No acute abnormality. Musculoskeletal: Soft tissue lytic lesion destroying the head and neck of the left  third rib with extension into the neural foramen as described above. No additional foci of osseous metastatic disease identified. Review of the MIP images confirms the above findings. IMPRESSION: 1. Approximally 9 cm ill-defined heterogeneous right lower lobe mass appears to cross the diaphragm and directly invade segment 7 of the liver. Findings are highly concerning for advanced primary bronchogenic carcinoma. 2. Lytic soft tissue metastasis in the left posterior mediastinum centered on the head and neck of the left third rib. The mass encroaches into the left T3-T4 neural foramen. 3. Presumably metastatic mediastinal lymphadenopathy. 4. Small right-sided pleural effusion is likely malignant. 5. Nonspecific right middle lobe pulmonary nodules measuring 0.6 and 0.2 cm. Small pulmonary metastases versus incidental benign nodules. 6. 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 7. 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 Ascending thoracic aortic aneurysm. Recommend semi-annual imaging followup by CTA or MRA and referral to cardiothoracic surgery if not already obtained. This recommendation follows 2010 ACCF/AHA/AATS/ACR/ASA/SCA/SCAI/SIR/STS/SVM Guidelines for the Diagnosis and Management of Patients With Thoracic Aortic Disease. Circulation. 2010; 121: F749-S496. Aortic aneurysm NOS (ICD10-I71.9); Aortic Atherosclerosis (ICD10-I70.0). 8. Coronary artery calcifications. Electronically Signed   By: Jacqulynn Cadet M.D.   On: 01/08/2021 17:09   MR BRAIN W WO CONTRAST  Result Date: 01/12/2021 CLINICAL DATA:  Initial evaluation for metastatic disease. EXAM: MRI HEAD WITHOUT AND WITH CONTRAST TECHNIQUE: Multiplanar, multiecho pulse sequences of the brain and surrounding structures were obtained without and with intravenous contrast. CONTRAST:  77mL GADAVIST GADOBUTROL 1 MMOL/ML IV SOLN COMPARISON:  None available. FINDINGS: Brain: Examination mildly degraded by motion  artifact. Generalized age-related cerebral atrophy. Patchy T2/FLAIR hyperintensity within the periventricular deep white matter both cerebral hemispheres most consistent with chronic small vessel ischemic disease, mild to moderate in nature. No abnormal foci of restricted diffusion to suggest acute or subacute ischemia. Gray-white matter differentiation maintained. No encephalomalacia to suggest chronic cortical infarction. No evidence for acute or chronic intracranial hemorrhage. No mass lesion, midline shift or mass effect. No hydrocephalus or extra-axial fluid collection. Pituitary gland suprasellar region normal. Midline structures intact. No abnormal enhancement. No evidence for intracranial metastatic disease. Vascular: Major intracranial vascular flow voids are maintained. Skull and upper cervical spine: Craniocervical junction within normal limits. Bone marrow signal intensity normal. No visible focal marrow replacing lesion. No scalp soft tissue abnormality. Sinuses/Orbits: Patient status post bilateral ocular lens replacement. Globes and orbital soft tissues demonstrate no other acute finding. Paranasal sinuses are largely clear. Small left mastoid effusion, of doubtful significance. Inner ear structures grossly normal. Other: None. IMPRESSION: 1. No evidence for intracranial metastatic disease. 2. Age-related cerebral atrophy with mild to moderate chronic microvascular ischemic disease. Electronically Signed   By: Pincus Badder.D.  On: 01/12/2021 01:31   MR THORACIC SPINE W WO CONTRAST  Result Date: 01/11/2021 CLINICAL DATA:  Initial evaluation for metastatic workup. EXAM: MRI THORACIC WITHOUT AND WITH CONTRAST TECHNIQUE: Multiplanar and multiecho pulse sequences of the thoracic spine were obtained without and with intravenous contrast. CONTRAST:  37mL GADAVIST GADOBUTROL 1 MMOL/ML IV SOLN COMPARISON:  Prior CTs from 01/08/2021. FINDINGS: Alignment: Physiologic with preservation of the normal  thoracic kyphosis. No listhesis. Vertebrae: Large metastatic implant involving the left aspect of the T1 and T2 vertebral bodies is seen. Lesion measures approximately 5.1 x 3.2 x 3.7 cm (series 19, image 8). Involvement of the adjacent left posterior T2 and T3 ribs noted. Invasion of the adjacent left T2-3 and T3-4 neural foramina. The left T2-3 neural foramen is largely obliterated, with moderate narrowing at the left T3-4 neural foramen. Mild flattening of the left aspect of the adjacent thecal sac without significant stenosis or additional epidural invasion at this time. No associated pathologic fracture. Additional 1.5 cm osseous metastasis seen at the right anterior aspect of T7 (series 22, image 9). Large metastatic implant involving the right posterior L1 vertebral body as well as the adjacent right posterior elements partially visualized (series 22, image 9). This lesion abuts the right aspect of the thecal sac at the level of T12-L1 (series 18, image 39). No other significant metastatic disease seen elsewhere within the thoracic spine at this time. Vertebral body height maintained. Underlying bone marrow signal intensity diffusely heterogeneous. No evidence for osteomyelitis discitis or septic arthritis. Cord: Normal signal and morphology. Other than the above described lesions at T2-3/T3-4 and L1 abutting the thecal sac, no other discernible epidural involvement. No other abnormal enhancement. Paraspinal and other soft tissues: Large necrotic mass positioned at the posterior right lung base again seen. Direct extension through the right hemidiaphragm into the adjacent liver noted. Associated irregular right pleural effusion. Right hilar and subcarinal adenopathy. Additional trace left pleural effusion. Findings better evaluated on prior CT. Probable cholelithiasis noted as well. Disc levels: Tiny right paracentral disc protrusion at T7-8 minimally flattens the right ventral thecal sac without significant  stenosis. Otherwise, no other significant disc pathology seen within the thoracic spine. No spinal stenosis. Mild bilateral foraminal narrowing noted at T10-11 due to facet disease. IMPRESSION: 1. Large metastatic implant involving the left aspect of the T1 and T2 vertebral bodies with invasion of the adjacent left T2-3 and T3-4 neural foramina. 2. Additional 1.5 cm metastatic implant involving the right anterior aspect of T7. 3. Large metastatic implant involving the right posterior L1 vertebral body and adjacent posterior elements, partially visualized. 4. No other significant metastatic involvement within the thoracic spine at this time. No other epidural involvement. No pathologic fracture. 5. Large necrotic mass at the posterior right lung base with invasion of the adjacent right hemidiaphragm and liver, with associated right pleural effusion and mediastinal adenopathy. Findings better evaluated on prior CT. Electronically Signed   By: Jeannine Boga M.D.   On: 01/11/2021 04:15   CT ABDOMEN PELVIS W CONTRAST  Result Date: 01/08/2021 CLINICAL DATA:  Left lower quadrant pain EXAM: CT ABDOMEN AND PELVIS WITH CONTRAST TECHNIQUE: Multidetector CT imaging of the abdomen and pelvis was performed using the standard protocol following bolus administration of intravenous contrast. CONTRAST:  154mL OMNIPAQUE IOHEXOL 350 MG/ML SOLN COMPARISON:  CT 01/19/2020, chest CT 01/08/2021, FINDINGS: Lower chest: Mild aneurysmal dilatation of the ascending aorta measuring up to 4.4 cm, heavily calcified. Extensive coronary vascular calcification. Normal cardiac size. Trace pericardial effusion.  Incompletely visualized necrotic appearing subcarinal lymph node measuring up to 2.6 cm. Large necrotic appearing mass in the medial right base with punctate calcifications, this measures approximately 9.6 by 7.8 cm and extends across the right diaphragm, into the adjacent liver. Thick rimmed hypodense enhancing lesion in the right  hepatic lobe measuring 3.8 cm. Small right-sided pleural effusion Hepatobiliary: No calcified gallstone. No biliary dilatation. Heterogenous enhancement of right hepatic lobe adjacent to mass lesion. Pancreas: Unremarkable. No pancreatic ductal dilatation or surrounding inflammatory changes. Spleen: Indeterminate hypoenhancing mass within the spleen measuring 2.4 cm, series 2, image 34. Adrenals/Urinary Tract: Adrenal glands are normal. Punctate nonobstructing stones within the bilateral kidneys. Scattered renal cysts. Interim finding of 14 mm indeterminate right renal lesion at the midpole, series 2, image 52. Interim finding of indeterminate 13 mm exophytic lesion mid pole left kidney, series 2, image 44. Slightly thick-walled urinary bladder. 4 mm stone within the right posterior bladder. Stomach/Bowel: The stomach is nonenlarged. No dilated small bowel. No acute bowel wall thickening. Moderate to large stool in the rectosigmoid colon with moderate formed feces at the rectum. Negative appendix Vascular/Lymphatic: Advanced aortic atherosclerosis. No aneurysm. No suspicious nodes. Reproductive: Post treatment changes of the prostate gland Other: Presacral edema and soft tissue stranding.  No free air. Musculoskeletal: Lytic lesion and soft tissue mass involving the L1 vertebra right pedicle, lamina, vertebral body and transverse process. Soft tissue mass involves the paraspinal soft tissues as well. 2.5 cm hypodense lesion in the right paraspinal soft tissues posterior to right transverse process at L2, series 2, image 49. Large lytic lesion L5 vertebral body. Chronic bilateral pars defect at L5. 5.5 x 4.3 cm enhancing heterogenous soft tissue mass in the left gluteus muscles, series 2, image 93. IMPRESSION: 1. Small right-sided pleural effusion with large necrotic appearing mass in the posteromedial right lower lobe that appears to traverse the right diaphragm and extends into the adjacent liver. 2. Lytic lesion  with associated soft tissue mass at L1 with additional lytic lesion at L5 vertebral body consistent with metastatic disease. 5.5 cm heterogenous enhancing soft tissue mass in the left gluteus muscles also suspect for metastatic disease. Small hypodense right paraspinal soft tissue mass posterior to transverse process of L2, concerning for metastatic disease 3. Vague hypodensity within the mid spleen is indeterminate for infarct or hypodense mass lesion. 4. Interval development of bilateral indeterminate renal lesions measuring up to 13 mm. 5. Nonobstructing kidney stones.  4 mm stone in the bladder Electronically Signed   By: Donavan Foil M.D.   On: 01/08/2021 17:39   CT L-SPINE NO CHARGE  Result Date: 01/08/2021 CLINICAL DATA:  Back pain. EXAM: CT LUMBAR SPINE WITH CONTRAST TECHNIQUE: Technique: Multiplanar CT images of the lumbar spine were reconstructed from contemporary CT of the Abdomen and Pelvis. CONTRAST:  No additional COMPARISON:  CT abdomen and pelvis 01/19/2020 FINDINGS: Segmentation: 5 lumbar type vertebrae. Alignment: Normal. Vertebrae: Chronic bilateral L5 pars defects. 5 cm destructive mass involving the right-sided posterior elements and posterior vertebral body of L1 mildly encroaching upon the right L1-2 neural foramen. 3.7 cm lytic lesion in the left aspect of the L5 vertebral body with focal disruption of the posterior vertebral body cortex and likely small volume epidural tumor in the left lateral recess. Preserved vertebral body heights. Paraspinal and other soft tissues: 2.5 cm hypodense mass in the paraspinal soft tissues posterior to the right L2 transverse process. Intra-abdominal and pelvic contents reported separately. Disc levels: Mild lumbar spondylosis and facet arthrosis without  high-grade spinal stenosis. Mild-to-moderate multilevel neural foraminal stenosis. IMPRESSION: 1. Destructive bone lesions at L1 and L5 consistent with metastatic disease. Suspected small volume epidural  tumor in the left lateral recess at L5. 2. 2.5 cm paraspinal soft tissue mass posterior to the right L2 transverse process also concerning for metastatic disease. Electronically Signed   By: Logan Bores M.D.   On: 01/08/2021 18:16   DG Chest Port 1 View  Result Date: 01/10/2021 CLINICAL DATA:  Status post biopsy of right lower lobe lung mass. EXAM: PORTABLE CHEST 1 VIEW COMPARISON:  CT chest 01/08/2021 FINDINGS: Intentionally extra tori image of the chest demonstrates no pneumothorax. Low lung volumes. Bandlike density along the right mid lung attributable to fluid in the minor fissure. Retro diaphragmatic density on the right compatible with known right lower lobe lesion. Stable mild peripheral interstitial accentuation in the lungs. Atherosclerotic calcification of the aortic arch. Degenerative glenohumeral arthropathy, left greater than right, with some volume loss in the left humeral head. Thoracic spondylosis noted. The known lytic lesion involving the left third rib and left third and fourth vertebra is relatively difficult to visualize on today's conventional radiograph. IMPRESSION: 1. No pneumothorax. 2. Trace fluid in the right minor fissure. 3. Mild chronic interstitial accentuation in the lung periphery. 4. Left greater than right glenohumeral arthropathy. 5.  Aortic Atherosclerosis (ICD10-I70.0). 6. Retro diaphragmatic density on the right compatible with known mass. Electronically Signed   By: Van Clines M.D.   On: 01/10/2021 14:51   CT LUNG MASS BIOPSY  Result Date: 01/10/2021 INDICATION: 75 year old male with indeterminate right lower lobe lung mass. EXAM: CT-guided lung biopsy COMPARISON:  CT chest from 01/08/2021 MEDICATIONS: None. ANESTHESIA/SEDATION: Fentanyl 50 mcg IV; Versed 1 mg IV Sedation time: 13 minutes; The patient was continuously monitored during the procedure by the interventional radiology nurse under my direct supervision. CONTRAST:  None COMPLICATIONS: None immediate.  PROCEDURE: Informed consent was obtained from the patient following an explanation of the procedure, risks, benefits and alternatives. The patient understands,agrees and consents for the procedure. All questions were addressed. A time out was performed prior to the initiation of the procedure. The patient was positioned in the left lateral decubitus position on the CT table and a limited chest CT was performed for procedural planning demonstrating similar-appearing right lower lobe mass. The operative site was prepped and draped in the usual sterile fashion. Under sterile conditions and local anesthesia, a 17 gauge coaxial needle was advanced into the peripheral aspect of the nodule. Positioning was confirmed with intermittent CT fluoroscopy and followed by the acquisition of total of 3 core samples with an 18 gauge core needle biopsy device. The coaxial needle was removed following deployment of a [employer] plug and superficial hemostasis was achieved with manual compression. Limited post procedural chest CT was negative for pneumothorax or additional complication. A dressing was placed. The patient tolerated the procedure well without immediate postprocedural complication. The patient was escorted to have an upright chest radiograph. IMPRESSION: Technically successful CT guided core needle core biopsy of right lower lung mass. Ruthann Cancer, MD [facility] Electronically Signed   By: Ruthann Cancer M.D.   On: 01/10/2021 13:47   Appears comfortable.    Discharge Exam: Vitals:   01/23/21 0542 01/24/21 0533  BP: 140/84 123/81  Pulse: 97 88  Resp: 16 16  Temp: 98 F (36.7 C) 97.6 F (36.4 C)  SpO2: 94% 98%   Vitals:   01/21/21 2052 01/22/21 0541 01/23/21  0542 01/24/21 0533  BP: (!) 153/95 (!) 190/97 140/84 123/81  Pulse: 82 95 97 88  Resp: 16 18 16 16   Temp: (!) 97.4 F (36.3 C) 97.6 F (36.4 C) 98 F (36.7 C) 97.6 F (36.4 C)  TempSrc:  Oral Oral Oral Oral  SpO2: 100% 100% 94% 98%  Weight:  73.8 kg    Height:        General: Pt is alert, awake, not in acute distress Cardiovascular: RRR, S1/S2 +, no rubs, no gallops Respiratory: CTA bilaterally, no wheezing, no rhonchi Abdominal: Soft, NT, ND, bowel sounds + Extremities: no edema, no cyanosis    The results of significant diagnostics from this hospitalization (including imaging, microbiology, ancillary and laboratory) are listed below for reference.     Microbiology: No results found for this or any previous visit (from the past 240 hour(s)).   Labs: BNP (last 3 results) No results for input(s): BNP in the last 8760 hours. Basic Metabolic Panel: Recent Labs  Lab 01/18/21 0456 01/20/21 0608  NA 134* 135  K 3.9 5.0  CL 99 99  CO2 26 29  GLUCOSE 131* 130*  BUN 16 15  CREATININE 0.53* 0.40*  CALCIUM 8.1* 8.1*   Liver Function Tests: No results for input(s): AST, ALT, ALKPHOS, BILITOT, PROT, ALBUMIN in the last 168 hours. No results for input(s): LIPASE, AMYLASE in the last 168 hours. No results for input(s): AMMONIA in the last 168 hours. CBC: Recent Labs  Lab 01/18/21 0456 01/20/21 0608  WBC 25.2* 24.5*  HGB 10.5* 10.8*  HCT 33.7* 34.1*  MCV 86.2 86.5  PLT 467* 412*   Cardiac Enzymes: No results for input(s): CKTOTAL, CKMB, CKMBINDEX, TROPONINI in the last 168 hours. BNP: Invalid input(s): POCBNP CBG: Recent Labs  Lab 01/21/21 0104  GLUCAP 130*   D-Dimer No results for input(s): DDIMER in the last 72 hours. Hgb A1c No results for input(s): HGBA1C in the last 72 hours. Lipid Profile No results for input(s): CHOL, HDL, LDLCALC, TRIG, CHOLHDL, LDLDIRECT in the last 72 hours. Thyroid function studies No results for input(s): TSH, T4TOTAL, T3FREE, THYROIDAB in the last 72 hours.  Invalid input(s): FREET3 Anemia work up No results for input(s): VITAMINB12, FOLATE, FERRITIN, TIBC, IRON, RETICCTPCT in the last 72 hours. Urinalysis     Component Value Date/Time   COLORURINE YELLOW 01/08/2021 1615   APPEARANCEUR CLEAR 01/08/2021 1615   APPEARANCEUR Clear 08/09/2020 1457   LABSPEC 1.025 01/08/2021 1615   PHURINE 6.0 01/08/2021 1615   GLUCOSEU NEGATIVE 01/08/2021 1615   HGBUR NEGATIVE 01/08/2021 [place] 01/08/2021 1615   BILIRUBINUR Negative 08/09/2020 [place] 01/08/2021 1615   PROTEINUR NEGATIVE 01/08/2021 1615   NITRITE NEGATIVE 01/08/2021 1615   LEUKOCYTESUR NEGATIVE 01/08/2021 1615   Sepsis Labs Invalid input(s): PROCALCITONIN,  WBC,  LACTICIDVEN Microbiology No results found for this or any previous visit (from the past 240 hour(s)).   Time coordinating discharge: 36 min  SIGNED:   Hosie Poisson, MD  [facility] 01/24/2021, 9:35 AM

## 2021-01-24 NOTE — Progress Notes (Signed)
Palliative care brief note  Plan for transition to residential hospice in [place].  No other palliative specific recommendations at this time.  Please call for palliative care specific needs.  Micheline Rough, MD [facility] Team [phone]  NO CHARGE NOTE

## 2021-01-24 NOTE — Care Management Important Message (Signed)
Important Message  Patient Details IM Letter given to the Patient. Name: Cody Carr MRN: [id number] Date of Birth: 1945/11/19   Medicare Important Message Given:  Yes     [name] 01/24/2021, 11:09 AM

## 2021-01-24 NOTE — Progress Notes (Signed)
The proposed treatment discussed in conference is for discussion purpose only and is not a binding recommendation. The patient was not been physically examined, or presented with their treatment options. Therefore, final treatment plans cannot be decided.  

## 2021-01-24 NOTE — TOC Transition Note (Signed)
Transition of Care [facility]) - CM/SW Discharge Note   Patient Details  Name: Cody Carr MRN: [id number] Date of Birth: [birth year]  Transition of Care [facility]) CM/SW Contact:  Lynnell Catalan, RN Phone Number: 01/24/2021, 10:49 AM   Clinical Narrative:    Pt to dc to [facility] today via [facility]. [facility] contacted for transport. Yellow DNR on chart. RN to call report to [phone].    Final next level of care: [place] Barriers to Discharge: Continued Medical Work up   Patient Goals and CMS Choice Patient states their goals for this hospitalization and ongoing recovery are:: return home with family CMS Medicare.gov Compare Post Acute Care list provided to:: Patient Represenative (must comment) (daughter/wife) Choice offered to / list presented to : Spouse, Adult Children  Discharge Placement                Patient to be transferred to facility by: [facility]      Discharge Plan and Services   Discharge Planning Services: CM Consult Post Acute Care Choice: Durable Medical Equipment, Home Health          DME Arranged: Hospital bed, Wheelchair manual DME Agency: [employer] Date DME Agency Contacted: 01/11/21 Time DME Agency Contacted: [id number] Representative spoke with at DME Agency: Freda Munro HH Arranged: RN, PT, OT [facility] Agency: [place] Date [place]: 01/11/21 Time [place]: 73 Representative spoke with at [place]: [place] ([place]) Interventions     Readmission Risk Interventions No flowsheet data found.

## 2021-01-25 ENCOUNTER — Ambulatory Visit: Payer: Medicare PPO

## 2021-01-25 DIAGNOSIS — T402X5D Adverse effect of other opioids, subsequent encounter: Secondary | ICD-10-CM | POA: Diagnosis not present

## 2021-01-25 DIAGNOSIS — C7801 Secondary malignant neoplasm of right lung: Secondary | ICD-10-CM | POA: Diagnosis not present

## 2021-01-25 DIAGNOSIS — D638 Anemia in other chronic diseases classified elsewhere: Secondary | ICD-10-CM | POA: Diagnosis not present

## 2021-01-25 DIAGNOSIS — K5903 Drug induced constipation: Secondary | ICD-10-CM | POA: Diagnosis not present

## 2021-01-28 ENCOUNTER — Ambulatory Visit: Payer: Medicare PPO

## 2021-01-29 ENCOUNTER — Ambulatory Visit: Payer: Medicare PPO

## 2021-01-30 ENCOUNTER — Ambulatory Visit: Payer: Medicare PPO

## 2021-02-02 DEATH — deceased

## 2021-03-31 NOTE — Progress Notes (Signed)
  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     Indication for treatment:  Palliation       Radiation treatment dates:   01/17/21 and 01/18/21  Site/dose:    The L1 lumbar spine was treated to 10 Gy in 2 fractions The C7-T5 spine was treated 10 Gy in 2 fractions  Beams/energy:    The L1 lumbar spine was treated using anterior and posterior beams The C7-T5 spine was treated with 3D conformal radiation with 3 non-coaxial beams  Narrative: The patient tolerated radiation treatment relatively well.   But, his condition was so poor, that the planned 20 Gy in 4 fractions was discontinued after the first 2 treatments.  Plan: The patient has completed radiation treatment. The patient will return to radiation oncology clinic for routine followup in one month. I advised him to call or return sooner if he has any questions or concerns related to his recovery or treatment. ________________________________  Sheral Apley. Tammi Klippel, M.D.
